# Patient Record
Sex: Male | Born: 1940 | Race: Asian | Hispanic: No | Marital: Married | Smoking: Former smoker
Health system: Southern US, Community
[De-identification: ages and names within clinical notes are randomized; demographics above are authoritative.]

## PROBLEM LIST (undated history)

## (undated) DIAGNOSIS — D649 Anemia, unspecified: Secondary | ICD-10-CM

## (undated) DIAGNOSIS — R011 Cardiac murmur, unspecified: Secondary | ICD-10-CM

## (undated) DIAGNOSIS — H4010X0 Unspecified open-angle glaucoma, stage unspecified: Secondary | ICD-10-CM

## (undated) DIAGNOSIS — R339 Retention of urine, unspecified: Secondary | ICD-10-CM

## (undated) DIAGNOSIS — J9611 Chronic respiratory failure with hypoxia: Secondary | ICD-10-CM

## (undated) DIAGNOSIS — N189 Chronic kidney disease, unspecified: Secondary | ICD-10-CM

## (undated) DIAGNOSIS — Q794 Prune belly syndrome: Secondary | ICD-10-CM

## (undated) DIAGNOSIS — I2510 Atherosclerotic heart disease of native coronary artery without angina pectoris: Secondary | ICD-10-CM

## (undated) DIAGNOSIS — Z87891 Personal history of nicotine dependence: Secondary | ICD-10-CM

## (undated) DIAGNOSIS — I259 Chronic ischemic heart disease, unspecified: Secondary | ICD-10-CM

## (undated) DIAGNOSIS — R918 Other nonspecific abnormal finding of lung field: Secondary | ICD-10-CM

## (undated) DIAGNOSIS — J449 Chronic obstructive pulmonary disease, unspecified: Secondary | ICD-10-CM

## (undated) DIAGNOSIS — I10 Essential (primary) hypertension: Secondary | ICD-10-CM

## (undated) DIAGNOSIS — K219 Gastro-esophageal reflux disease without esophagitis: Secondary | ICD-10-CM

## (undated) DIAGNOSIS — N320 Bladder-neck obstruction: Secondary | ICD-10-CM

## (undated) DIAGNOSIS — N390 Urinary tract infection, site not specified: Secondary | ICD-10-CM

## (undated) DIAGNOSIS — N200 Calculus of kidney: Secondary | ICD-10-CM

## (undated) DIAGNOSIS — E119 Type 2 diabetes mellitus without complications: Secondary | ICD-10-CM

## (undated) DIAGNOSIS — R0602 Shortness of breath: Secondary | ICD-10-CM

## (undated) DIAGNOSIS — G4730 Sleep apnea, unspecified: Secondary | ICD-10-CM

## (undated) DIAGNOSIS — H409 Unspecified glaucoma: Secondary | ICD-10-CM

## (undated) HISTORY — PX: HERNIA REPAIR: SHX51

## (undated) HISTORY — DX: Chronic kidney disease, unspecified: N18.9

## (undated) HISTORY — DX: Personal history of nicotine dependence: Z87.891

## (undated) HISTORY — DX: Chronic ischemic heart disease, unspecified: I25.9

## (undated) HISTORY — DX: Chronic respiratory failure with hypoxia: J96.11

## (undated) HISTORY — PX: CATARACT EXTRACTION, BILATERAL: SHX1313

## (undated) HISTORY — DX: Bladder-neck obstruction: N32.0

## (undated) HISTORY — DX: Unspecified open-angle glaucoma, stage unspecified: H40.10X0

## (undated) HISTORY — PX: BLADDER NECK RECONSTRUCTION: SHX1239

## (undated) HISTORY — DX: Essential (primary) hypertension: I10

## (undated) HISTORY — DX: Type 2 diabetes mellitus without complications: E11.9

## (undated) HISTORY — DX: Urinary tract infection, site not specified: N39.0

## (undated) HISTORY — DX: Chronic obstructive pulmonary disease, unspecified: J44.9

## (undated) HISTORY — DX: Retention of urine, unspecified: R33.9

## (undated) HISTORY — DX: Anemia, unspecified: D64.9

## (undated) HISTORY — DX: Calculus of kidney: N20.0

## (undated) HISTORY — DX: Gastro-esophageal reflux disease without esophagitis: K21.9

## (undated) HISTORY — DX: Prune belly syndrome: Q79.4

## (undated) HISTORY — DX: Atherosclerotic heart disease of native coronary artery without angina pectoris: I25.10

## (undated) HISTORY — DX: Shortness of breath: R06.02

---

## 2013-09-05 DIAGNOSIS — N390 Urinary tract infection, site not specified: Secondary | ICD-10-CM

## 2013-09-05 DIAGNOSIS — R339 Retention of urine, unspecified: Secondary | ICD-10-CM

## 2013-09-05 HISTORY — DX: Urinary tract infection, site not specified: N39.0

## 2013-09-05 HISTORY — DX: Retention of urine, unspecified: R33.9

## 2014-04-24 DIAGNOSIS — N320 Bladder-neck obstruction: Secondary | ICD-10-CM

## 2014-04-24 HISTORY — DX: Bladder-neck obstruction: N32.0

## 2014-05-26 DIAGNOSIS — I129 Hypertensive chronic kidney disease with stage 1 through stage 4 chronic kidney disease, or unspecified chronic kidney disease: Secondary | ICD-10-CM

## 2014-05-26 DIAGNOSIS — E1129 Type 2 diabetes mellitus with other diabetic kidney complication: Secondary | ICD-10-CM

## 2014-05-26 DIAGNOSIS — N183 Chronic kidney disease, stage 3 unspecified: Secondary | ICD-10-CM | POA: Insufficient documentation

## 2014-05-26 DIAGNOSIS — D631 Anemia in chronic kidney disease: Secondary | ICD-10-CM | POA: Insufficient documentation

## 2014-05-26 DIAGNOSIS — E1122 Type 2 diabetes mellitus with diabetic chronic kidney disease: Secondary | ICD-10-CM | POA: Insufficient documentation

## 2014-05-26 DIAGNOSIS — Q794 Prune belly syndrome: Secondary | ICD-10-CM

## 2014-05-26 DIAGNOSIS — J449 Chronic obstructive pulmonary disease, unspecified: Secondary | ICD-10-CM

## 2014-05-26 DIAGNOSIS — Z87891 Personal history of nicotine dependence: Secondary | ICD-10-CM

## 2014-05-26 DIAGNOSIS — D649 Anemia, unspecified: Secondary | ICD-10-CM

## 2014-05-26 DIAGNOSIS — K219 Gastro-esophageal reflux disease without esophagitis: Secondary | ICD-10-CM

## 2014-05-26 DIAGNOSIS — IMO0001 Reserved for inherently not codable concepts without codable children: Secondary | ICD-10-CM | POA: Insufficient documentation

## 2014-05-26 DIAGNOSIS — N189 Chronic kidney disease, unspecified: Secondary | ICD-10-CM

## 2014-05-26 DIAGNOSIS — E119 Type 2 diabetes mellitus without complications: Secondary | ICD-10-CM

## 2014-05-26 DIAGNOSIS — Z794 Long term (current) use of insulin: Secondary | ICD-10-CM

## 2014-05-26 DIAGNOSIS — I10 Essential (primary) hypertension: Secondary | ICD-10-CM

## 2014-05-26 HISTORY — DX: Anemia, unspecified: D64.9

## 2014-05-26 HISTORY — DX: Type 2 diabetes mellitus without complications: E11.9

## 2014-05-26 HISTORY — DX: Essential (primary) hypertension: I10

## 2014-05-26 HISTORY — DX: Chronic obstructive pulmonary disease, unspecified: J44.9

## 2014-05-26 HISTORY — DX: Gastro-esophageal reflux disease without esophagitis: K21.9

## 2014-05-26 HISTORY — DX: Prune belly syndrome: Q79.4

## 2014-05-26 HISTORY — DX: Chronic kidney disease, unspecified: N18.9

## 2014-05-26 HISTORY — DX: Personal history of nicotine dependence: Z87.891

## 2014-08-25 DIAGNOSIS — E119 Type 2 diabetes mellitus without complications: Secondary | ICD-10-CM | POA: Diagnosis not present

## 2014-08-25 DIAGNOSIS — R601 Generalized edema: Secondary | ICD-10-CM | POA: Diagnosis not present

## 2014-08-25 DIAGNOSIS — E785 Hyperlipidemia, unspecified: Secondary | ICD-10-CM | POA: Diagnosis not present

## 2014-08-25 DIAGNOSIS — J441 Chronic obstructive pulmonary disease with (acute) exacerbation: Secondary | ICD-10-CM | POA: Diagnosis not present

## 2014-08-25 DIAGNOSIS — I509 Heart failure, unspecified: Secondary | ICD-10-CM | POA: Diagnosis not present

## 2014-09-18 DIAGNOSIS — R339 Retention of urine, unspecified: Secondary | ICD-10-CM | POA: Diagnosis not present

## 2014-09-18 DIAGNOSIS — N390 Urinary tract infection, site not specified: Secondary | ICD-10-CM | POA: Diagnosis not present

## 2014-09-18 DIAGNOSIS — N289 Disorder of kidney and ureter, unspecified: Secondary | ICD-10-CM | POA: Diagnosis not present

## 2014-10-19 DIAGNOSIS — E559 Vitamin D deficiency, unspecified: Secondary | ICD-10-CM | POA: Diagnosis not present

## 2014-10-19 DIAGNOSIS — I10 Essential (primary) hypertension: Secondary | ICD-10-CM | POA: Diagnosis not present

## 2014-10-19 DIAGNOSIS — J449 Chronic obstructive pulmonary disease, unspecified: Secondary | ICD-10-CM | POA: Diagnosis not present

## 2014-10-19 DIAGNOSIS — M169 Osteoarthritis of hip, unspecified: Secondary | ICD-10-CM | POA: Diagnosis not present

## 2014-10-19 DIAGNOSIS — E119 Type 2 diabetes mellitus without complications: Secondary | ICD-10-CM | POA: Diagnosis not present

## 2014-10-26 DIAGNOSIS — H6063 Unspecified chronic otitis externa, bilateral: Secondary | ICD-10-CM | POA: Diagnosis not present

## 2014-10-26 DIAGNOSIS — J310 Chronic rhinitis: Secondary | ICD-10-CM | POA: Diagnosis not present

## 2014-10-26 DIAGNOSIS — H6123 Impacted cerumen, bilateral: Secondary | ICD-10-CM | POA: Diagnosis not present

## 2014-10-26 DIAGNOSIS — H9193 Unspecified hearing loss, bilateral: Secondary | ICD-10-CM | POA: Diagnosis not present

## 2014-11-08 DIAGNOSIS — J019 Acute sinusitis, unspecified: Secondary | ICD-10-CM | POA: Diagnosis not present

## 2014-11-08 DIAGNOSIS — J209 Acute bronchitis, unspecified: Secondary | ICD-10-CM | POA: Diagnosis not present

## 2014-11-08 DIAGNOSIS — B002 Herpesviral gingivostomatitis and pharyngotonsillitis: Secondary | ICD-10-CM | POA: Diagnosis not present

## 2014-11-16 DIAGNOSIS — E559 Vitamin D deficiency, unspecified: Secondary | ICD-10-CM | POA: Diagnosis not present

## 2014-11-16 DIAGNOSIS — E785 Hyperlipidemia, unspecified: Secondary | ICD-10-CM | POA: Diagnosis not present

## 2014-11-16 DIAGNOSIS — R601 Generalized edema: Secondary | ICD-10-CM | POA: Diagnosis not present

## 2014-11-16 DIAGNOSIS — E119 Type 2 diabetes mellitus without complications: Secondary | ICD-10-CM | POA: Diagnosis not present

## 2014-11-16 DIAGNOSIS — I10 Essential (primary) hypertension: Secondary | ICD-10-CM | POA: Diagnosis not present

## 2014-11-16 DIAGNOSIS — N189 Chronic kidney disease, unspecified: Secondary | ICD-10-CM | POA: Diagnosis not present

## 2014-11-21 DIAGNOSIS — J310 Chronic rhinitis: Secondary | ICD-10-CM | POA: Diagnosis not present

## 2014-11-21 DIAGNOSIS — H6123 Impacted cerumen, bilateral: Secondary | ICD-10-CM | POA: Diagnosis not present

## 2014-11-21 DIAGNOSIS — H6063 Unspecified chronic otitis externa, bilateral: Secondary | ICD-10-CM | POA: Diagnosis not present

## 2014-11-21 DIAGNOSIS — H9193 Unspecified hearing loss, bilateral: Secondary | ICD-10-CM | POA: Diagnosis not present

## 2014-12-19 DIAGNOSIS — I10 Essential (primary) hypertension: Secondary | ICD-10-CM | POA: Diagnosis not present

## 2014-12-19 DIAGNOSIS — E559 Vitamin D deficiency, unspecified: Secondary | ICD-10-CM | POA: Diagnosis not present

## 2014-12-19 DIAGNOSIS — M109 Gout, unspecified: Secondary | ICD-10-CM | POA: Diagnosis not present

## 2014-12-19 DIAGNOSIS — E785 Hyperlipidemia, unspecified: Secondary | ICD-10-CM | POA: Diagnosis not present

## 2014-12-19 DIAGNOSIS — M1990 Unspecified osteoarthritis, unspecified site: Secondary | ICD-10-CM | POA: Diagnosis not present

## 2014-12-19 DIAGNOSIS — E119 Type 2 diabetes mellitus without complications: Secondary | ICD-10-CM | POA: Diagnosis not present

## 2014-12-19 DIAGNOSIS — R7989 Other specified abnormal findings of blood chemistry: Secondary | ICD-10-CM | POA: Diagnosis not present

## 2014-12-19 DIAGNOSIS — J449 Chronic obstructive pulmonary disease, unspecified: Secondary | ICD-10-CM | POA: Diagnosis not present

## 2015-01-09 DIAGNOSIS — H4011X2 Primary open-angle glaucoma, moderate stage: Secondary | ICD-10-CM | POA: Diagnosis not present

## 2015-01-09 DIAGNOSIS — H18419 Arcus senilis, unspecified eye: Secondary | ICD-10-CM | POA: Diagnosis not present

## 2015-01-09 DIAGNOSIS — I7090 Unspecified atherosclerosis: Secondary | ICD-10-CM | POA: Diagnosis not present

## 2015-01-09 DIAGNOSIS — H02839 Dermatochalasis of unspecified eye, unspecified eyelid: Secondary | ICD-10-CM | POA: Diagnosis not present

## 2015-01-09 DIAGNOSIS — E119 Type 2 diabetes mellitus without complications: Secondary | ICD-10-CM | POA: Diagnosis not present

## 2015-01-09 DIAGNOSIS — H35033 Hypertensive retinopathy, bilateral: Secondary | ICD-10-CM | POA: Diagnosis not present

## 2015-01-22 DIAGNOSIS — J209 Acute bronchitis, unspecified: Secondary | ICD-10-CM | POA: Diagnosis not present

## 2015-01-22 DIAGNOSIS — J019 Acute sinusitis, unspecified: Secondary | ICD-10-CM | POA: Diagnosis not present

## 2015-02-27 DIAGNOSIS — M109 Gout, unspecified: Secondary | ICD-10-CM | POA: Diagnosis not present

## 2015-02-27 DIAGNOSIS — J449 Chronic obstructive pulmonary disease, unspecified: Secondary | ICD-10-CM | POA: Diagnosis not present

## 2015-02-27 DIAGNOSIS — E119 Type 2 diabetes mellitus without complications: Secondary | ICD-10-CM | POA: Diagnosis not present

## 2015-02-27 DIAGNOSIS — R7989 Other specified abnormal findings of blood chemistry: Secondary | ICD-10-CM | POA: Diagnosis not present

## 2015-02-27 DIAGNOSIS — I10 Essential (primary) hypertension: Secondary | ICD-10-CM | POA: Diagnosis not present

## 2015-02-27 DIAGNOSIS — E785 Hyperlipidemia, unspecified: Secondary | ICD-10-CM | POA: Diagnosis not present

## 2015-03-19 DIAGNOSIS — R339 Retention of urine, unspecified: Secondary | ICD-10-CM | POA: Diagnosis not present

## 2015-03-19 DIAGNOSIS — N200 Calculus of kidney: Secondary | ICD-10-CM | POA: Diagnosis not present

## 2015-03-19 DIAGNOSIS — N289 Disorder of kidney and ureter, unspecified: Secondary | ICD-10-CM | POA: Diagnosis not present

## 2015-03-19 DIAGNOSIS — N390 Urinary tract infection, site not specified: Secondary | ICD-10-CM | POA: Diagnosis not present

## 2015-03-19 HISTORY — DX: Calculus of kidney: N20.0

## 2015-03-21 DIAGNOSIS — N200 Calculus of kidney: Secondary | ICD-10-CM | POA: Diagnosis not present

## 2015-03-21 DIAGNOSIS — Z01818 Encounter for other preprocedural examination: Secondary | ICD-10-CM | POA: Diagnosis not present

## 2015-03-26 DIAGNOSIS — N200 Calculus of kidney: Secondary | ICD-10-CM | POA: Diagnosis not present

## 2015-03-30 DIAGNOSIS — E119 Type 2 diabetes mellitus without complications: Secondary | ICD-10-CM | POA: Diagnosis not present

## 2015-03-30 DIAGNOSIS — M7981 Nontraumatic hematoma of soft tissue: Secondary | ICD-10-CM | POA: Diagnosis not present

## 2015-03-30 DIAGNOSIS — J449 Chronic obstructive pulmonary disease, unspecified: Secondary | ICD-10-CM | POA: Diagnosis not present

## 2015-04-23 DIAGNOSIS — E669 Obesity, unspecified: Secondary | ICD-10-CM | POA: Diagnosis not present

## 2015-04-23 DIAGNOSIS — E662 Morbid (severe) obesity with alveolar hypoventilation: Secondary | ICD-10-CM | POA: Diagnosis not present

## 2015-04-23 DIAGNOSIS — J45909 Unspecified asthma, uncomplicated: Secondary | ICD-10-CM | POA: Diagnosis not present

## 2015-04-23 DIAGNOSIS — J9611 Chronic respiratory failure with hypoxia: Secondary | ICD-10-CM | POA: Diagnosis not present

## 2015-04-24 DIAGNOSIS — Z794 Long term (current) use of insulin: Secondary | ICD-10-CM | POA: Diagnosis not present

## 2015-04-24 DIAGNOSIS — E538 Deficiency of other specified B group vitamins: Secondary | ICD-10-CM | POA: Diagnosis not present

## 2015-04-24 DIAGNOSIS — E119 Type 2 diabetes mellitus without complications: Secondary | ICD-10-CM | POA: Diagnosis not present

## 2015-09-19 DIAGNOSIS — I129 Hypertensive chronic kidney disease with stage 1 through stage 4 chronic kidney disease, or unspecified chronic kidney disease: Secondary | ICD-10-CM | POA: Diagnosis not present

## 2015-09-19 DIAGNOSIS — E119 Type 2 diabetes mellitus without complications: Secondary | ICD-10-CM | POA: Diagnosis not present

## 2015-09-19 DIAGNOSIS — E1122 Type 2 diabetes mellitus with diabetic chronic kidney disease: Secondary | ICD-10-CM | POA: Diagnosis not present

## 2015-09-19 DIAGNOSIS — N200 Calculus of kidney: Secondary | ICD-10-CM | POA: Diagnosis not present

## 2015-09-19 DIAGNOSIS — N183 Chronic kidney disease, stage 3 (moderate): Secondary | ICD-10-CM | POA: Diagnosis not present

## 2015-09-19 DIAGNOSIS — N189 Chronic kidney disease, unspecified: Secondary | ICD-10-CM | POA: Diagnosis not present

## 2015-09-19 DIAGNOSIS — J449 Chronic obstructive pulmonary disease, unspecified: Secondary | ICD-10-CM | POA: Diagnosis not present

## 2015-10-17 DIAGNOSIS — H00015 Hordeolum externum left lower eyelid: Secondary | ICD-10-CM | POA: Diagnosis not present

## 2015-10-17 DIAGNOSIS — H35033 Hypertensive retinopathy, bilateral: Secondary | ICD-10-CM | POA: Diagnosis not present

## 2015-10-17 DIAGNOSIS — H401132 Primary open-angle glaucoma, bilateral, moderate stage: Secondary | ICD-10-CM | POA: Diagnosis not present

## 2015-10-17 DIAGNOSIS — H02839 Dermatochalasis of unspecified eye, unspecified eyelid: Secondary | ICD-10-CM | POA: Diagnosis not present

## 2015-10-17 DIAGNOSIS — H35019 Changes in retinal vascular appearance, unspecified eye: Secondary | ICD-10-CM | POA: Diagnosis not present

## 2015-10-17 DIAGNOSIS — H43399 Other vitreous opacities, unspecified eye: Secondary | ICD-10-CM | POA: Diagnosis not present

## 2015-10-17 DIAGNOSIS — H10029 Other mucopurulent conjunctivitis, unspecified eye: Secondary | ICD-10-CM | POA: Diagnosis not present

## 2015-10-17 DIAGNOSIS — H18419 Arcus senilis, unspecified eye: Secondary | ICD-10-CM | POA: Diagnosis not present

## 2015-10-17 DIAGNOSIS — E119 Type 2 diabetes mellitus without complications: Secondary | ICD-10-CM | POA: Diagnosis not present

## 2015-11-06 DIAGNOSIS — E119 Type 2 diabetes mellitus without complications: Secondary | ICD-10-CM | POA: Diagnosis not present

## 2015-11-06 DIAGNOSIS — Z961 Presence of intraocular lens: Secondary | ICD-10-CM | POA: Diagnosis not present

## 2015-11-06 DIAGNOSIS — H35372 Puckering of macula, left eye: Secondary | ICD-10-CM | POA: Diagnosis not present

## 2015-11-06 DIAGNOSIS — H401131 Primary open-angle glaucoma, bilateral, mild stage: Secondary | ICD-10-CM | POA: Diagnosis not present

## 2016-02-20 DIAGNOSIS — I509 Heart failure, unspecified: Secondary | ICD-10-CM | POA: Diagnosis not present

## 2016-02-20 DIAGNOSIS — J449 Chronic obstructive pulmonary disease, unspecified: Secondary | ICD-10-CM | POA: Diagnosis not present

## 2016-02-20 DIAGNOSIS — I10 Essential (primary) hypertension: Secondary | ICD-10-CM | POA: Diagnosis not present

## 2016-02-20 DIAGNOSIS — E1122 Type 2 diabetes mellitus with diabetic chronic kidney disease: Secondary | ICD-10-CM | POA: Diagnosis not present

## 2016-02-20 DIAGNOSIS — E119 Type 2 diabetes mellitus without complications: Secondary | ICD-10-CM | POA: Diagnosis not present

## 2016-03-07 DIAGNOSIS — I10 Essential (primary) hypertension: Secondary | ICD-10-CM | POA: Diagnosis not present

## 2016-03-07 DIAGNOSIS — I509 Heart failure, unspecified: Secondary | ICD-10-CM | POA: Diagnosis not present

## 2016-03-07 DIAGNOSIS — F419 Anxiety disorder, unspecified: Secondary | ICD-10-CM | POA: Diagnosis not present

## 2016-03-07 DIAGNOSIS — J449 Chronic obstructive pulmonary disease, unspecified: Secondary | ICD-10-CM | POA: Diagnosis not present

## 2016-03-24 DIAGNOSIS — N200 Calculus of kidney: Secondary | ICD-10-CM | POA: Diagnosis not present

## 2016-03-24 DIAGNOSIS — R351 Nocturia: Secondary | ICD-10-CM | POA: Diagnosis not present

## 2016-05-23 DIAGNOSIS — Z6836 Body mass index (BMI) 36.0-36.9, adult: Secondary | ICD-10-CM | POA: Diagnosis not present

## 2016-05-23 DIAGNOSIS — J45909 Unspecified asthma, uncomplicated: Secondary | ICD-10-CM | POA: Diagnosis not present

## 2016-05-23 DIAGNOSIS — E662 Morbid (severe) obesity with alveolar hypoventilation: Secondary | ICD-10-CM | POA: Diagnosis not present

## 2016-05-23 DIAGNOSIS — J9611 Chronic respiratory failure with hypoxia: Secondary | ICD-10-CM | POA: Diagnosis not present

## 2016-06-02 DIAGNOSIS — Z23 Encounter for immunization: Secondary | ICD-10-CM | POA: Diagnosis not present

## 2016-12-10 ENCOUNTER — Telehealth: Payer: Self-pay | Admitting: *Deleted

## 2016-12-10 NOTE — Telephone Encounter (Signed)
PreVisit Call attempted. Left VM. 

## 2016-12-11 ENCOUNTER — Encounter: Payer: Self-pay | Admitting: Family Medicine

## 2016-12-11 ENCOUNTER — Ambulatory Visit (INDEPENDENT_AMBULATORY_CARE_PROVIDER_SITE_OTHER): Payer: Medicare Other | Admitting: Family Medicine

## 2016-12-11 ENCOUNTER — Ambulatory Visit (INDEPENDENT_AMBULATORY_CARE_PROVIDER_SITE_OTHER): Payer: Medicare Other

## 2016-12-11 VITALS — BP 108/64 | HR 65 | Temp 98.2°F | Wt 202.8 lb

## 2016-12-11 DIAGNOSIS — E162 Hypoglycemia, unspecified: Secondary | ICD-10-CM | POA: Diagnosis not present

## 2016-12-11 DIAGNOSIS — D649 Anemia, unspecified: Secondary | ICD-10-CM | POA: Diagnosis not present

## 2016-12-11 DIAGNOSIS — E875 Hyperkalemia: Secondary | ICD-10-CM | POA: Diagnosis not present

## 2016-12-11 DIAGNOSIS — R079 Chest pain, unspecified: Secondary | ICD-10-CM | POA: Diagnosis not present

## 2016-12-11 DIAGNOSIS — N183 Chronic kidney disease, stage 3 unspecified: Secondary | ICD-10-CM

## 2016-12-11 DIAGNOSIS — R109 Unspecified abdominal pain: Secondary | ICD-10-CM

## 2016-12-11 DIAGNOSIS — J449 Chronic obstructive pulmonary disease, unspecified: Secondary | ICD-10-CM | POA: Diagnosis not present

## 2016-12-11 DIAGNOSIS — E559 Vitamin D deficiency, unspecified: Secondary | ICD-10-CM

## 2016-12-11 DIAGNOSIS — I952 Hypotension due to drugs: Secondary | ICD-10-CM | POA: Diagnosis not present

## 2016-12-11 DIAGNOSIS — Z794 Long term (current) use of insulin: Secondary | ICD-10-CM

## 2016-12-11 DIAGNOSIS — E1122 Type 2 diabetes mellitus with diabetic chronic kidney disease: Secondary | ICD-10-CM | POA: Diagnosis not present

## 2016-12-11 LAB — POCT URINALYSIS DIPSTICK
Bilirubin, UA: NEGATIVE
Blood, UA: NEGATIVE
Glucose, UA: NEGATIVE
Ketones, UA: NEGATIVE
Leukocytes, UA: NEGATIVE
Nitrite, UA: NEGATIVE
Protein, UA: NEGATIVE
Spec Grav, UA: 1.02 (ref 1.010–1.025)
Urobilinogen, UA: 0.2 E.U./dL
pH, UA: 6 (ref 5.0–8.0)

## 2016-12-11 LAB — CBC
HCT: 38.1 % — ABNORMAL LOW (ref 39.0–52.0)
Hemoglobin: 12.4 g/dL — ABNORMAL LOW (ref 13.0–17.0)
MCHC: 32.4 g/dL (ref 30.0–36.0)
MCV: 94 fl (ref 78.0–100.0)
Platelets: 180 10*3/uL (ref 150.0–400.0)
RBC: 4.05 Mil/uL — ABNORMAL LOW (ref 4.22–5.81)
RDW: 14.9 % (ref 11.5–15.5)
WBC: 12.4 10*3/uL — ABNORMAL HIGH (ref 4.0–10.5)

## 2016-12-11 LAB — COMPREHENSIVE METABOLIC PANEL
ALT: 19 U/L (ref 0–53)
AST: 18 U/L (ref 0–37)
Albumin: 3.8 g/dL (ref 3.5–5.2)
Alkaline Phosphatase: 81 U/L (ref 39–117)
BUN: 48 mg/dL — ABNORMAL HIGH (ref 6–23)
CO2: 27 mEq/L (ref 19–32)
Calcium: 9.5 mg/dL (ref 8.4–10.5)
Chloride: 103 mEq/L (ref 96–112)
Creatinine, Ser: 2.05 mg/dL — ABNORMAL HIGH (ref 0.40–1.50)
GFR: 33.88 mL/min — ABNORMAL LOW (ref >60.00)
Glucose, Bld: 48 mg/dL — CL (ref 70–99)
Potassium: 5.2 mEq/L — ABNORMAL HIGH (ref 3.5–5.1)
Sodium: 137 mEq/L (ref 135–145)
Total Bilirubin: 0.3 mg/dL (ref 0.2–1.2)
Total Protein: 7.7 g/dL (ref 6.0–8.3)

## 2016-12-11 LAB — VITAMIN D 25 HYDROXY (VIT D DEFICIENCY, FRACTURES): VITD: 52.54 ng/mL (ref 30.00–100.00)

## 2016-12-11 LAB — POCT GLYCOSYLATED HEMOGLOBIN (HGB A1C): Hemoglobin A1C: 5.8

## 2016-12-11 MED ORDER — HYDROCODONE-ACETAMINOPHEN 10-325 MG PO TABS: 1.0000 | ORAL_TABLET | Freq: Three times a day (TID) | ORAL | 0 refills | Status: DC | PRN

## 2016-12-11 NOTE — Progress Notes (Addendum)
Lucas Price is a 76 y.o. male is here to [facility].   History of Present Illness:   Shaune Pascal CMA acting as scribe for Dr. [name].   HPI  Patient comes in today to establish care. He is having some left side flank pain and abdominal pain that has been going on for four days. Patient has a history of kidney stones that he sees a urologist for. Kidney stone is always on the left side as now. Patient also states that he has had some chest pain 2 days ago. He says that it is a pulling sensation. He has chronic shortness of breath, but has gotten worse over that last two days.  SEE A/P FOR PROBLEM BASED CHARTING.  Health Maintenance Due  Topic Date Due  . HEMOGLOBIN A1C  1941-06-01  . FOOT EXAM  08/15/1952  . OPHTHALMOLOGY EXAM  08/15/1952  . TETANUS/TDAP  08/15/1961  . COLONOSCOPY  08/15/1992  . PNA vac Low Risk Adult (1 of 2 - PCV13) 08/16/2007   PMHx, SurgHx, SocialHx, Medications, and Allergies were reviewed in the Visit Navigator and updated as appropriate.   Past Medical History:  Diagnosis Date  . Anemia 05/26/2014  . Bladder neck obstruction 04/24/2014  . CKD (chronic kidney disease) 05/26/2014  . COPD (chronic obstructive pulmonary disease) ([place]) 05/26/2014  . Diabetes mellitus ([place]) 05/26/2014  . Former smoker 05/26/2014  . GERD (gastroesophageal reflux disease) 05/26/2014  . HTN (hypertension) 05/26/2014  . 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 Recurrent urinary tract infection 09/05/2013  . Renal calculus 03/19/2015  . Retention of urine 09/05/2013   No past surgical history on file. No family history on file. Social History  Substance Use Topics  . Smoking status: Never Smoker  . Smokeless tobacco: Never Used  . Alcohol use No   Current Medications and Allergies:   .  allopurinol (ZYLOPRIM) 100 MG tablet, Take 100 mg by mouth., Disp: , Rfl:  .  aspirin EC 81 MG tablet, Take 81 mg by mouth., Disp: , Rfl:  .  calcium carbonate (CALCIUM 600) 600 MG TABS  tablet, Take 600 mg by mouth., Disp: , Rfl:  .  Cholecalciferol (D 10000) 10000 units CAPS, Take by mouth., Disp: , Rfl:  .  clotrimazole-betamethasone (LOTRISONE) cream, Apply topically., Disp: , Rfl:  .  Coenzyme Q-10 200 MG CAPS, Take 200 mg by mouth., Disp: , Rfl:  .  glimepiride (AMARYL) 4 MG tablet, Take 4 mg by mouth., Disp: , Rfl:  .  insulin glargine (LANTUS) 100 UNIT/ML injection, Inject 50 Units into the skin at bedtime., Disp: , Rfl:  .  losartan (COZAAR) 50 MG tablet, Take 25-50 mg by mouth., Disp: , Rfl:  .  metoprolol (TOPROL-XL) 200 MG 24 hr tablet, Take 200 mg by mouth., Disp: , Rfl:  .  sitaGLIPtin (JANUVIA) 100 MG tablet, Take 100mg  daily, Disp: , Rfl:  .  Tiotropium Bromide Monohydrate (SPIRIVA RESPIMAT) 2.5 MCG/ACT AERS, Use as directed, Disp: , Rfl:  .  torsemide (DEMADEX) 100 MG tablet, Take 25-50 mg by mouth., Disp: , Rfl:  .  Travoprost, BAK Free, (TRAVATAN Z) 0.004 % SOLN ophthalmic solution, Use as directed, Disp: , Rfl:  .  triamcinolone cream (KENALOG) 0.1 %, Use as directed as needed, Disp: , Rfl:   No Known Allergies   Review of Systems:   Review of Systems  Constitutional: Negative for chills, fever and malaise/fatigue.  HENT: Negative for congestion, ear pain, sinus pain and sore throat.  Eyes: Negative for blurred vision and double vision.  Respiratory: Positive for shortness of breath. Negative for cough and wheezing.        Chronic.   Cardiovascular: Positive for chest pain. Negative for palpitations and leg swelling.       Chest pain 2 days. Had a pulling sensation.   Gastrointestinal: Positive for abdominal pain. Negative for constipation, diarrhea and vomiting.       Left side.   Genitourinary: Negative for dysuria and hematuria.  Musculoskeletal: Negative for back pain, joint pain and neck pain.  Neurological: Negative for dizziness and headaches.  Psychiatric/Behavioral: Negative for depression, hallucinations and memory loss.   Vitals:    Vitals:   12/11/16 1416  BP: 108/64  Pulse: 65  Temp: 98.2 F (36.8 C)  TempSrc: Oral  SpO2: 94%  Weight: 202 lb 12.8 oz (92 kg)     There is no height or weight on file to calculate BMI.  Physical Exam:   Physical Exam  Constitutional: He appears well-developed and well-nourished. No distress.  HENT:  Head: Normocephalic and atraumatic.  Right Ear: External ear normal.  Left Ear: External ear normal.  Eyes: EOM are normal. Pupils are equal, round, and reactive to light.  Neck: No thyromegaly present.  Cardiovascular: Normal rate and regular rhythm.   Pulmonary/Chest: Effort normal. No accessory muscle usage. No tachypnea. No respiratory distress. He has no wheezes.  Abdominal: Soft. Bowel sounds are normal. He exhibits no mass.  Neurological: He is alert.  Skin: Skin is warm.   Results for orders placed or performed in visit on 12/11/16  CBC  Result Value Ref Range   WBC 12.4 (H) 4.0 - 10.5 K/uL   RBC 4.05 (L) 4.22 - 5.81 Mil/uL   Platelets 180.0 Repeated and verified X2. 150.0 - 400.0 K/uL   Hemoglobin 12.4 (L) 13.0 - 17.0 g/dL   HCT 38.1 (L) 39.0 - 52.0 %   MCV 94.0 78.0 - 100.0 fl   MCHC 32.4 30.0 - 36.0 g/dL   RDW 14.9 11.5 - 15.5 %  Comprehensive metabolic panel  Result Value Ref Range   Sodium 137 135 - 145 mEq/L   Potassium 5.2 (H) 3.5 - 5.1 mEq/L   Chloride 103 96 - 112 mEq/L   CO2 27 19 - 32 mEq/L   Glucose, Bld 48 (LL) 70 - 99 mg/dL   BUN 48 (H) 6 - 23 mg/dL   Creatinine, Ser 2.05 (H) 0.40 - 1.50 mg/dL   Total Bilirubin 0.3 0.2 - 1.2 mg/dL   Alkaline Phosphatase 81 39 - 117 U/L   AST 18 0 - 37 U/L   ALT 19 0 - 53 U/L   Total Protein 7.7 6.0 - 8.3 g/dL   Albumin 3.8 3.5 - 5.2 g/dL   Calcium 9.5 8.4 - 10.5 mg/dL   GFR 33.88 (L) >60.00 mL/min  VITAMIN D 25 Hydroxy (Vit-D Deficiency, Fractures)  Result Value Ref Range   VITD 52.54 30.00 - 100.00 ng/mL  POCT urinalysis dipstick  Result Value Ref Range   Color, UA Yellow    Clarity, UA Clear     Glucose, UA Negative    Bilirubin, UA Negative    Ketones, UA Negative    Spec Grav, UA 1.020 1.010 - 1.025   Blood, UA Negative    pH, UA 6.0 5.0 - 8.0   Protein, UA Negative    Urobilinogen, UA 0.2 0.2 or 1.0 E.U./dL   Nitrite, UA Negative    Leukocytes, UA Negative  Negative  POCT glycosylated hemoglobin (Hb A1C)  Result Value Ref Range   Hemoglobin A1C 5.8    Assessment and Plan:   Nolberto was seen today for establish care.  Diagnoses and all orders for this visit:  Chest pain, unspecified type Comments: None now. History of chest pain a few days ago. EKG reassuring. Multiple cardiac risk factors. Will refer to cardiology. Orders: -     CBC -     Comprehensive metabolic panel -     EKG 36-IWOE -     Ambulatory referral to Cardiology -     Cancel: DG Chest 2 View; Future  Flank pain Comments: With history of kidney stone. Followed by Urology. KUB today with findings of constipation. This is not new per the patient. Bowel regimen reviewed.   ULTRASOUND OF RETROPERITONEUM/URINARY TRACT, 09/19/2015 3:53 PM  1. Left nephrolithiasis. 2. Left lower pole renal cyst. 3. Parenchymal thinning, right greater than left. No hydronephrosis.    Orders:       -     DG Abd 1 View: There is moderate stool throughout the colon. There is no bowel dilatation or air-fluid levels suggesting bowel obstruction. No free air. There is degenerative change in the lumbar spine. There is atelectatic change in the left base. -     POCT urinalysis dipstick -     Urine culture -     CBC -     Comprehensive metabolic panel -     HYDROcodone-acetaminophen (NORCO) 10-325 MG tablet; Take 1 tablet by mouth every 8 (eight) hours as needed.  Type 2 diabetes mellitus with stage 3 chronic kidney disease, with long-term current use of insulin (HCC) Comments: Current symptoms: No polyuria, polydipsia, blurry vision, foot burning, numbness, or pain. Taking medication compliantly. Not monitoring blood glucose due to  annoyance of using strips. He endorses episodes of hypoglycemia around noon each day.  On ACE inhibitor or angiotensin II receptor blocker?  Yes On Aspirin? Yes  Overtreated. We'll discontinue Amaryl. Reviewed the importance of monitoring blood sugars.  Lab Results  Component Value Date   HGBA1C 5.8 12/11/2016   Orders: -     POCT glycosylated hemoglobin (Hb A1C) -     Continuous Blood Gluc Sensor ([place]) MISC; Per instructions. -     Continuous Blood Gluc Receiver (FREESTYLE LIBRE READER) DEVI; 1 Device by Does not apply route once.  Vitamin D deficiency Comments: Resolved. Orders: -     VITAMIN D 25 Hydroxy (Vit-D Deficiency, Fractures)  CKD (chronic kidney disease) stage 3, GFR 30-59 ml/min Comments: Worsened from baseline per patient. Will refer to renal for further evaluation. Will add on labs. See below.  Lab Results  Component Value Date   CREATININE 2.05 (H) 12/11/2016   Orders: -     Ambulatory referral to Nephrology -     Magnesium -     Phosphorus  Hypoglycemia Comments: I recommended stopping Amaryl. I will provide a prescription for continuous glucose monitoring.  Hypotension due to drugs Comments: Will recommend decreasing hypertension medication treatments.  Blood pressure 108/64, pulse 65.  Hyperkalemia Comments: Mild. Without EKG changes. EKG NSR.  Anemia, unspecified type Comments: Likely secondary to chronic kidney disease.  Lab Results  Component Value Date   WBC 12.4 (H) 12/11/2016   HGB 12.4 (L) 12/11/2016   HCT 38.1 (L) 12/11/2016   MCV 94.0 12/11/2016   PLT 180.0 Repeated and verified X2. 12/11/2016   Chronic obstructive pulmonary disease, unspecified COPD type ([place])  Comments: Patient uses nasal cannula oxygen 1-2 L at home. He is unable to walk any distance without significant oxygen desaturations. Former smoker.   . Reviewed expectations re: course of current medical issues. . Discussed  self-management of symptoms. . Outlined signs and symptoms indicating need for more acute intervention. . Patient verbalized understanding and all questions were answered. . See orders for this visit as documented in the electronic medical record. . Patient received an After Visit Summary.  Records requested if needed. I spent 60 minutes with this patient, greater than 50% was face-to-face time counseling regarding the above diagnoses.  CMA served as Education administrator during this visit. History, Physical, and Plan performed by medical provider. Documentation and orders reviewed and attested to. Briscoe Deutscher, D.O.  Briscoe Deutscher, [place], [facility] 12/11/2016

## 2016-12-12 ENCOUNTER — Telehealth: Payer: Self-pay | Admitting: Family Medicine

## 2016-12-12 ENCOUNTER — Other Ambulatory Visit (INDEPENDENT_AMBULATORY_CARE_PROVIDER_SITE_OTHER): Payer: Medicare Other

## 2016-12-12 DIAGNOSIS — N183 Chronic kidney disease, stage 3 unspecified: Secondary | ICD-10-CM

## 2016-12-12 LAB — PHOSPHORUS: Phosphorus: 5.5 mg/dL — ABNORMAL HIGH (ref 2.3–4.6)

## 2016-12-12 LAB — MAGNESIUM: Magnesium: 2.5 mg/dL (ref 1.5–2.5)

## 2016-12-12 LAB — URINE CULTURE: Organism ID, Bacteria: NO GROWTH

## 2016-12-12 MED ORDER — FREESTYLE LIBRE SENSOR SYSTEM MISC: 2 refills | Status: DC

## 2016-12-12 MED ORDER — FREESTYLE LIBRE READER DEVI: 1.0000 | Freq: Once | 0 refills | Status: DC

## 2016-12-12 NOTE — Addendum Note (Signed)
Addended by: Frutoso Chase A on: 12/12/2016 11:51 AM   Modules accepted: Orders

## 2016-12-12 NOTE — Telephone Encounter (Signed)
error 

## 2016-12-29 ENCOUNTER — Ambulatory Visit (INDEPENDENT_AMBULATORY_CARE_PROVIDER_SITE_OTHER): Payer: Medicare Other | Admitting: Family Medicine

## 2016-12-29 ENCOUNTER — Encounter: Payer: Self-pay | Admitting: Family Medicine

## 2016-12-29 ENCOUNTER — Telehealth: Payer: Self-pay | Admitting: Family Medicine

## 2016-12-29 VITALS — BP 104/58 | HR 80 | Temp 98.2°F | Wt 205.4 lb

## 2016-12-29 DIAGNOSIS — B029 Zoster without complications: Secondary | ICD-10-CM

## 2016-12-29 DIAGNOSIS — I10 Essential (primary) hypertension: Secondary | ICD-10-CM | POA: Diagnosis not present

## 2016-12-29 DIAGNOSIS — IMO0001 Reserved for inherently not codable concepts without codable children: Secondary | ICD-10-CM

## 2016-12-29 DIAGNOSIS — E1129 Type 2 diabetes mellitus with other diabetic kidney complication: Secondary | ICD-10-CM | POA: Diagnosis not present

## 2016-12-29 DIAGNOSIS — Z794 Long term (current) use of insulin: Secondary | ICD-10-CM | POA: Diagnosis not present

## 2016-12-29 MED ORDER — VALACYCLOVIR HCL 1 G PO TABS: 1000.0000 mg | ORAL_TABLET | Freq: Two times a day (BID) | ORAL | 0 refills | Status: AC

## 2016-12-29 MED ORDER — ACYCLOVIR 5 % EX OINT: 1.0000 "application " | TOPICAL_OINTMENT | CUTANEOUS | 3 refills | Status: DC

## 2016-12-29 NOTE — Telephone Encounter (Signed)
Patient thinks he has shingles.  I put him on Dr. Alcario Drought schedule for a same day at 2:30pm.  Thank you,  -LL

## 2016-12-29 NOTE — Progress Notes (Signed)
Lucas Price is a 76 y.o. male here for an acute visit.  History of Present Illness:   Lucas Price CMA acting as scribe for Dr. [name].  HPI Patient is coming in today due to rash on right side of chest under his arm. First noticed about three weeks ago. Patient has not tried anything on the rash. Rash has gotten worse since first noticed rash.   We also reviewed the patient's most recent labs and my recommendations. See lab results and notes.   PMHx, SurgHx, SocialHx, Medications, and Allergies were reviewed in the Visit Navigator and updated as appropriate.  Current Medications:   .  allopurinol (ZYLOPRIM) 100 MG tablet, Take 100 mg by mouth., Disp: , Rfl:  .  aspirin EC 81 MG tablet, Take 81 mg by mouth., Disp: , Rfl:  .  calcium carbonate (CALCIUM 600) 600 MG TABS tablet, Take 600 mg by mouth., Disp: , Rfl:  .  Cholecalciferol (D 10000) 10000 units CAPS, Take by mouth., Disp: , Rfl:  .  clotrimazole-betamethasone (LOTRISONE) cream, Apply topically., Disp: , Rfl:  .  Coenzyme Q-10 200 MG CAPS, Take 200 mg by mouth., Disp: , Rfl:  .  Continuous Blood Gluc Sensor ([place]) MISC, Per instructions., Disp: 1 each, Rfl: 2 .  glimepiride (AMARYL) 4 MG tablet, Take 4 mg by mouth., Disp: , Rfl:  .  HYDROcodone-acetaminophen (NORCO) 10-325 MG tablet, Take 1 tablet by mouth every 8 (eight) hours as needed., Disp: 30 tablet, Rfl: 0 .  insulin glargine (LANTUS) 100 UNIT/ML injection, Inject 50 Units into the skin at bedtime., Disp: , Rfl:  .  losartan (COZAAR) 50 MG tablet, Take 25-50 mg by mouth., Disp: , Rfl:  .  metoprolol (TOPROL-XL) 200 MG 24 hr tablet, Take 200 mg by mouth., Disp: , Rfl:  .  sitaGLIPtin (JANUVIA) 100 MG tablet, Take 100mg  daily, Disp: , Rfl:  .  Tiotropium Bromide Monohydrate (SPIRIVA RESPIMAT) 2.5 MCG/ACT AERS, Use as directed, Disp: , Rfl:  .  torsemide (DEMADEX) 100 MG tablet, Take 25-50 mg by mouth., Disp: , Rfl:  .  Travoprost, BAK Free,  (TRAVATAN Z) 0.004 % SOLN ophthalmic solution, Use as directed, Disp: , Rfl:  .  triamcinolone cream (KENALOG) 0.1 %, Use as directed as needed, Disp: , Rfl:  .  Continuous Blood Gluc Receiver (FREESTYLE LIBRE READER) DEVI, 1 Device by Does not apply route once., Disp: 1 Device, Rfl: 0   No Known Allergies   Review of Systems:   Review of Systems  Constitutional: Positive for malaise/fatigue. Negative for chills and fever.       Chronic.  HENT: Negative for congestion, ear pain, sinus pain and sore throat.   Eyes: Negative for blurred vision and double vision.  Respiratory: Positive for cough and shortness of breath. Negative for wheezing.        Chronic.   Cardiovascular: Negative for palpitations and leg swelling.  Gastrointestinal: Positive for constipation. Negative for abdominal pain and diarrhea.       Chronic.   Genitourinary: Negative for dysuria.  Musculoskeletal: Negative for back pain, joint pain and neck pain.  Skin: Positive for itching and rash.  Neurological: Negative for dizziness and headaches.  Psychiatric/Behavioral: Negative for depression, hallucinations and memory loss.    Vitals:   Vitals:   12/29/16 1438  BP: (!) 104/58  Pulse: 80  Temp: 98.2 F (36.8 C)  TempSrc: Oral  SpO2: 90%  Weight: 205 lb 6.4 oz (93.2 kg)  There is no height or weight on file to calculate BMI.  Physical Exam:   Physical Exam  Constitutional: He is oriented to person, place, and time. He appears well-developed and well-nourished. No distress.  HENT:  Head: Normocephalic and atraumatic.  Right Ear: External ear normal.  Left Ear: External ear normal.  Nose: Nose normal.  Mouth/Throat: Oropharynx is clear and moist.  Eyes: Conjunctivae and EOM are normal. Pupils are equal, round, and reactive to light.  Neck: Normal range of motion. Neck supple.  Cardiovascular: Normal rate, regular rhythm, normal heart sounds and intact distal pulses.   Pulmonary/Chest: Effort normal  and breath sounds normal.  Abdominal: Soft. Bowel sounds are normal.  Musculoskeletal: Normal range of motion.  Neurological: He is alert and oriented to person, place, and time.  Skin: Skin is warm and dry.     Psychiatric: He has a normal mood and affect. His behavior is normal. Judgment and thought content normal.  Nursing note and vitals reviewed.   Results for orders placed or performed in visit on 12/12/16  Magnesium  Result Value Ref Range   Magnesium 2.5 1.5 - 2.5 mg/dL  Phosphorus  Result Value Ref Range   Phosphorus 5.5 (H) 2.3 - 4.6 mg/dL    Assessment and Plan:   Monterio was seen today for rash.  Diagnoses and all orders for this visit:  Herpes zoster without complication Comments: With evidence of post-herpetic neuralgia.  Orders: -     acyclovir ointment (ZOVIRAX) 5 %; Apply 1 application topically every 3 (three) hours.  Essential hypertension Comments: He is not interested in changing his HTN regimen. I reviewed my reasoning for decreasing his current dose (increase perfusion, decrease falls).   Controlled insulin dependent diabetes mellitus with renal manifestation ([place]) Comments: He has decreaesd the Lantus since our last visit, but did not stop the Amaryl.    . Reviewed expectations re: course of current medical issues. . Discussed self-management of symptoms. . Outlined signs and symptoms indicating need for more acute intervention. . Patient verbalized understanding and all questions were answered. [name] Health Maintenance issues including appropriate healthy diet, exercise, and smoking avoidance were discussed with patient. . See orders for this visit as documented in the electronic medical record. . Patient received an After Visit Summary.  CMA served as Education administrator during this visit. History, Physical, and Plan performed by medical provider. The above documentation has been reviewed and is accurate and complete. Briscoe Deutscher, D.O.  Briscoe Deutscher,  DO [place], [facility] [place] 12/29/2016  No future appointments.

## 2016-12-29 NOTE — Patient Instructions (Addendum)
Results for orders placed or performed in visit on 12/12/16  Magnesium  Result Value Ref Range   Magnesium 2.5 1.5 - 2.5 mg/dL  Phosphorus  Result Value Ref Range   Phosphorus 5.5 (H) 2.3 - 4.6 mg/dL   Lab Results  Component Value Date   CREATININE 2.05 (H) 12/11/2016   Wt Readings from Last 3 Encounters:  12/29/16 205 lb 6.4 oz (93.2 kg)  12/11/16 202 lb 12.8 oz (92 kg)

## 2016-12-29 NOTE — Telephone Encounter (Signed)
Patient seen.

## 2016-12-29 NOTE — Telephone Encounter (Signed)
Dr. [name] -  Please provide questions if further evaluation prior to appointment is requested

## 2016-12-31 ENCOUNTER — Telehealth: Payer: Self-pay | Admitting: Family Medicine

## 2016-12-31 NOTE — Telephone Encounter (Signed)
Patient would like a prescription for acyloclovir called in to [facility] mail order pharmacy. 400 mg  Qty #270 1 tablet  3 times a day with 1 refill.

## 2016-12-31 NOTE — Telephone Encounter (Signed)
Please advise 

## 2017-01-01 MED ORDER — ACYCLOVIR 800 MG PO TABS: 800.0000 mg | ORAL_TABLET | Freq: Three times a day (TID) | ORAL | 0 refills | Status: DC

## 2017-01-01 NOTE — Telephone Encounter (Signed)
Spoke with patient and he was fine with the RX. He requested it be sent to [facility] mail order pharmacy. RX sent.

## 2017-01-01 NOTE — Telephone Encounter (Signed)
He has uncomplicated herpes zoster that is already resolving. Normally, treatment would be Acyclovir 800 five times daily x 7 days. This would be decreased to three times daily x 7 days with his GFR. I am okay with sending this Rx in only.

## 2017-01-07 ENCOUNTER — Telehealth: Payer: Self-pay | Admitting: Family Medicine

## 2017-01-07 DIAGNOSIS — R109 Unspecified abdominal pain: Secondary | ICD-10-CM

## 2017-01-07 NOTE — Telephone Encounter (Signed)
**  Remind patient they can make refill requests via MyChart**  Medication refill request (Name & Dosage):  HYDROcodone-acetaminophen (NORCO) 10-325 MG tablet  Preferred pharmacy (Name & Address):  [place], [place]. [phone] (Phone) [phone] (Fax)      Other comments (if applicable):    Please call and advise patient. Patient would like to get 40 tablets instead, patient is okay if he has to stay with the same dosage.

## 2017-01-07 NOTE — Telephone Encounter (Signed)
Please advise on refill.

## 2017-01-08 ENCOUNTER — Telehealth: Payer: Self-pay | Admitting: Family Medicine

## 2017-01-08 MED ORDER — HYDROCODONE-ACETAMINOPHEN 10-325 MG PO TABS: 1.0000 | ORAL_TABLET | Freq: Three times a day (TID) | ORAL | 0 refills | Status: DC | PRN

## 2017-01-08 NOTE — Telephone Encounter (Signed)
Patient called to check the status of the rx. Patient requests a call once it is prescribed. Patient requests a call today if possible.

## 2017-01-08 NOTE — Telephone Encounter (Signed)
Notified patient that RX will be up front for them to pick up. Patient is sending his son to pick up RX.

## 2017-01-08 NOTE — Telephone Encounter (Signed)
Okay refill. 

## 2017-01-08 NOTE — Addendum Note (Signed)
Addended by: Durwin Glaze on: 01/08/2017 04:07 PM   Modules accepted: Orders

## 2017-01-08 NOTE — Telephone Encounter (Signed)
error 

## 2017-01-22 ENCOUNTER — Telehealth: Payer: Self-pay | Admitting: Family Medicine

## 2017-01-22 MED ORDER — GLIMEPIRIDE 4 MG PO TABS: 4.0000 mg | ORAL_TABLET | Freq: Every day | ORAL | 1 refills | Status: DC

## 2017-01-22 NOTE — Telephone Encounter (Signed)
Patient is requesting the following sent to [facility] mail order  glimepiride (AMARYL) 4 MG tablet [[id number]]  Qty 180 1 refill Take one bid with meal

## 2017-01-22 NOTE — Telephone Encounter (Signed)
RX refill sent to [facility].

## 2017-01-27 ENCOUNTER — Encounter: Payer: Self-pay | Admitting: Family Medicine

## 2017-01-27 DIAGNOSIS — N2581 Secondary hyperparathyroidism of renal origin: Secondary | ICD-10-CM | POA: Diagnosis not present

## 2017-01-27 DIAGNOSIS — I129 Hypertensive chronic kidney disease with stage 1 through stage 4 chronic kidney disease, or unspecified chronic kidney disease: Secondary | ICD-10-CM | POA: Diagnosis not present

## 2017-01-27 DIAGNOSIS — D631 Anemia in chronic kidney disease: Secondary | ICD-10-CM | POA: Diagnosis not present

## 2017-01-27 DIAGNOSIS — N183 Chronic kidney disease, stage 3 (moderate): Secondary | ICD-10-CM | POA: Diagnosis not present

## 2017-01-27 LAB — CBC AND DIFFERENTIAL
HCT: 37 — AB (ref 41–53)
HEMATOCRIT: 37 — AB (ref 41–53)
HEMOGLOBIN: 12.6 — AB (ref 13.5–17.5)
Hemoglobin: 12.6 — AB (ref 13.5–17.5)
NEUTROS ABS: 8
PLATELETS: 198 (ref 150–399)
Platelets: 198 (ref 150–399)
WBC: 10.8
WBC: 10.8

## 2017-01-27 LAB — BASIC METABOLIC PANEL
BUN: 39 — AB (ref 4–21)
CREATININE: 2 — AB (ref 0.6–1.3)
GLUCOSE: 78
Potassium: 4.9 (ref 3.4–5.3)
Sodium: 140 (ref 137–147)

## 2017-01-27 LAB — VITAMIN D 25 HYDROXY (VIT D DEFICIENCY, FRACTURES)
VIT D 25 HYDROXY: 35.7
Vit D, 25-Hydroxy: 35.7

## 2017-01-30 ENCOUNTER — Encounter: Payer: Self-pay | Admitting: Family Medicine

## 2017-01-30 LAB — MAGNESIUM: Magnesium: 2.3

## 2017-01-30 LAB — FERRITIN: FERRITIN: 98

## 2017-01-30 LAB — ESTIMATED GFR: GFR CALC NON AF AMER: 32

## 2017-01-30 LAB — PROTEIN, URINE, RANDOM: PROTEIN UR: NEGATIVE

## 2017-02-19 ENCOUNTER — Telehealth: Payer: Self-pay | Admitting: Family Medicine

## 2017-02-19 ENCOUNTER — Other Ambulatory Visit: Payer: Self-pay

## 2017-02-19 DIAGNOSIS — E119 Type 2 diabetes mellitus without complications: Secondary | ICD-10-CM

## 2017-02-19 MED ORDER — INSULIN PEN NEEDLE 30G X 8 MM MISC: 1.0000 | 3 refills | Status: DC | PRN

## 2017-02-19 MED ORDER — SYRINGE (DISPOSABLE) 1 ML MISC: 3 refills | Status: AC

## 2017-02-19 MED ORDER — "INSULIN SYRINGE-NEEDLE U-100 31G X 5/16"" 1 ML MISC": 1.0000 | Freq: Three times a day (TID) | 1 refills | Status: DC

## 2017-02-19 NOTE — Telephone Encounter (Signed)
Rx sent to pharmacy   

## 2017-02-19 NOTE — Telephone Encounter (Signed)
Patient is requesting a new RX to [facility] on file. He states that he just ran out without realizing  VD insulin syringes 1 ML/ 8MM/ 30G 90 day supply with refills   Verified cell # is best

## 2017-03-30 DIAGNOSIS — N200 Calculus of kidney: Secondary | ICD-10-CM | POA: Diagnosis not present

## 2017-03-30 DIAGNOSIS — R351 Nocturia: Secondary | ICD-10-CM | POA: Diagnosis not present

## 2017-04-17 NOTE — Progress Notes (Deleted)
Subjective:   Lucas Price is a 75 y.o. male who presents for an Initial Medicare Annual Wellness Visit.  Review of Systems  No ROS.  Medicare Wellness Visit. Additional risk factors are reflected in the social history.       Objective:    There were no vitals filed for this visit. There is no height or weight on file to calculate BMI.  Current Medications (verified) Outpatient Encounter Prescriptions as of 04/23/2017  Medication Sig  . acyclovir (ZOVIRAX) 800 MG tablet Take 1 tablet (800 mg total) by mouth 3 (three) times daily.  [name] acyclovir ointment (ZOVIRAX) 5 % Apply 1 application topically every 3 (three) hours.  [name] allopurinol (ZYLOPRIM) 100 MG tablet Take 100 mg by mouth.  [name] aspirin EC 81 MG tablet Take 81 mg by mouth.  . calcium carbonate (CALCIUM 600) 600 MG TABS tablet Take 600 mg by mouth.  . Cholecalciferol (D 10000) 10000 units CAPS Take by mouth.  . clotrimazole-betamethasone (LOTRISONE) cream Apply topically.  . Coenzyme Q-10 200 MG CAPS Take 200 mg by mouth.  . Continuous Blood Gluc Receiver (FREESTYLE LIBRE READER) DEVI 1 Device by Does not apply route once.  . Continuous Blood Gluc Sensor ([place]) MISC Per instructions.  [name] glimepiride (AMARYL) 4 MG tablet Take 1 tablet (4 mg total) by mouth daily with breakfast.  . HYDROcodone-acetaminophen (NORCO) 10-325 MG tablet Take 1 tablet by mouth every 8 (eight) hours as needed.  . insulin glargine (LANTUS) 100 UNIT/ML injection Inject 50 Units into the skin at bedtime.  . Insulin Pen Needle (NOVOFINE) 30G X 8 MM MISC Inject 10 each into the skin as needed.  . Insulin Syringe-Needle U-100 (RELION INSULIN SYRINGE 1ML/31G) 31G X 5/16" 1 ML MISC 1 Syringe by Does not apply route 3 (three) times daily.  [name] losartan (COZAAR) 50 MG tablet Take 25-50 mg by mouth.  . metoprolol (TOPROL-XL) 200 MG 24 hr tablet Take 200 mg by mouth.  . sitaGLIPtin (JANUVIA) 100 MG tablet Take 100mg  daily  . Syringe,  Disposable, 1 ML MISC Use with insulin needle.  . Tiotropium Bromide Monohydrate (SPIRIVA RESPIMAT) 2.5 MCG/ACT AERS Use as directed  . torsemide (DEMADEX) 100 MG tablet Take 25-50 mg by mouth.  . Travoprost, BAK Free, (TRAVATAN Z) 0.004 % SOLN ophthalmic solution Use as directed  . triamcinolone cream (KENALOG) 0.1 % Use as directed as needed   No facility-administered encounter medications on file as of 04/23/2017.     Allergies (verified) Patient has no known allergies.   History: Past Medical History:  Diagnosis Date  . Anemia 05/26/2014  . Bladder neck obstruction 04/24/2014  . CKD (chronic kidney disease) 05/26/2014  . COPD (chronic obstructive pulmonary disease) ([place]) 05/26/2014  . Diabetes mellitus ([place]) 05/26/2014  . Former smoker 05/26/2014  . GERD (gastroesophageal reflux disease) 05/26/2014  . HTN (hypertension) 05/26/2014  . 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 Recurrent urinary tract infection 09/05/2013  . Renal calculus 03/19/2015  . Retention of urine 09/05/2013   No past surgical history on file. No family history on file. Social History   Occupational History  . Not on file.   Social History Main Topics  . Smoking status: Never Smoker  . Smokeless tobacco: Never Used  . Alcohol use No  . Drug use: No  . Sexual activity: Yes    Partners: Female   Tobacco Counseling Counseling given: Not Answered   Activities of Daily Living No flowsheet data found.  Immunizations  and Health Maintenance  There is no immunization history on file for this patient. Health Maintenance Due  Topic Date Due  . FOOT EXAM  08/15/1950  . OPHTHALMOLOGY EXAM  08/15/1952  . TETANUS/TDAP  08/15/1961  . COLONOSCOPY  08/15/1992  . PNA vac Low Risk Adult (1 of 2 - PCV13) 08/16/2007  . INFLUENZA VACCINE  03/11/2017    Patient Care Team: Briscoe Deutscher, DO as PCP - General (Family Medicine)  Indicate any recent Medical Services you may have received from other than [facility]  providers in the past year (date may be approximate).    Assessment:   This is a routine wellness examination for Lucas Price. Physical assessment deferred to PCP.   Hearing/Vision screen No exam data present  Dietary issues and exercise activities discussed:    Goals    None     Depression Screen No flowsheet data found.  Fall Risk No flowsheet data found.  Cognitive Function:        Screening Tests Health Maintenance  Topic Date Due  . FOOT EXAM  08/15/1952  . OPHTHALMOLOGY EXAM  08/15/1952  . TETANUS/TDAP  08/15/1961  . COLONOSCOPY  08/15/1992  . PNA vac Low Risk Adult (1 of 2 - PCV13) 08/16/2007  . INFLUENZA VACCINE  03/11/2017  . HEMOGLOBIN A1C  06/13/2017        Plan:   Follow up with PCP as directed.  I have personally reviewed and noted the following in the patient's chart:   . Medical and social history . Use of alcohol, tobacco or illicit drugs  . Current medications and supplements . Functional ability and status . Nutritional status . Physical activity . Advanced directives . List of other physicians . Vitals . Screenings to include cognitive, depression, and falls . Referrals and appointments  In addition, I have reviewed and discussed with patient certain preventive protocols, quality metrics, and best practice recommendations. A written personalized care plan for preventive services as well as general preventive health recommendations were provided to patient.     Ree Edman, RN   04/17/2017

## 2017-04-17 NOTE — Progress Notes (Deleted)
PCP notes:   Health maintenance: Foot exam Opthamology Exam Tdap Colonoscopy PCV 13   Abnormal screenings:    Patient concerns:    Nurse concerns:   Next PCP appt:

## 2017-04-17 NOTE — Progress Notes (Deleted)
Pre visit review using our clinic review tool, if applicable. No additional management support is needed unless otherwise documented below in the visit note. 

## 2017-04-23 ENCOUNTER — Ambulatory Visit: Payer: Medicare Other | Admitting: *Deleted

## 2017-04-23 ENCOUNTER — Other Ambulatory Visit: Payer: Medicare Other

## 2017-04-28 NOTE — Progress Notes (Signed)
Pre visit review using our clinic review tool, if applicable. No additional management support is needed unless otherwise documented below in the visit note. 

## 2017-04-28 NOTE — Progress Notes (Signed)
Subjective:   Lucas Price is a 76 y.o. male who presents for an Initial Medicare Annual Wellness Visit.  Review of Systems  No ROS.  Medicare Wellness Visit. Additional risk factors are reflected in the social history.  Cardiac Risk Factors include: advanced age (>64men, >74 women);diabetes mellitus;hypertension;obesity (BMI >30kg/m2);male gender    Objective:    Today's Vitals   04/29/17 1058  BP: (!) 110/58  Pulse: 79  Resp: 16  SpO2: 93%  Weight: 189 lb 12.8 oz (86.1 kg)  Height: 5\' 2"  (1.575 m)   Body mass index is 34.71 kg/m.  Current Medications (verified) Outpatient Encounter Prescriptions as of 04/29/2017  Medication Sig  . allopurinol (ZYLOPRIM) 100 MG tablet Take 100 mg by mouth daily.   [name] aspirin EC 81 MG tablet Take 81 mg by mouth daily.   . calcium carbonate (CALCIUM 600) 600 MG TABS tablet Take 600 mg by mouth daily.   . Cholecalciferol (D 10000) 10000 units CAPS Take by mouth 2 (two) times a week.   . Coenzyme Q-10 200 MG CAPS Take 200 mg by mouth daily.   . Continuous Blood Gluc Sensor ([place]) MISC Per instructions.  [name] glimepiride (AMARYL) 4 MG tablet Take 1 tablet (4 mg total) by mouth daily with breakfast.  . HYDROcodone-acetaminophen (NORCO) 10-325 MG tablet Take 1 tablet by mouth every 8 (eight) hours as needed.  . insulin glargine (LANTUS) 100 UNIT/ML injection Inject 50 Units into the skin at bedtime.  . Insulin Pen Needle (NOVOFINE) 30G X 8 MM MISC Inject 10 each into the skin as needed.  . Insulin Syringe-Needle U-100 (RELION INSULIN SYRINGE 1ML/31G) 31G X 5/16" 1 ML MISC 1 Syringe by Does not apply route 3 (three) times daily.  [name] losartan (COZAAR) 50 MG tablet Take 25-50 mg by mouth daily.   . metoprolol (TOPROL-XL) 200 MG 24 hr tablet Take 200 mg by mouth daily.   . sitaGLIPtin (JANUVIA) 100 MG tablet Take 100mg  daily  . Syringe, Disposable, 1 ML MISC Use with insulin needle.  . Tiotropium Bromide Monohydrate (SPIRIVA  RESPIMAT) 2.5 MCG/ACT AERS Use as directed  . torsemide (DEMADEX) 100 MG tablet Take 25-50 mg by mouth daily.   . Travoprost, BAK Free, (TRAVATAN Z) 0.004 % SOLN ophthalmic solution Use as directed  . triamcinolone cream (KENALOG) 0.1 % Use as directed as needed  . acyclovir (ZOVIRAX) 800 MG tablet Take 1 tablet (800 mg total) by mouth 3 (three) times daily. (Patient not taking: Reported on 04/29/2017)  . acyclovir ointment (ZOVIRAX) 5 % Apply 1 application topically every 3 (three) hours. (Patient not taking: Reported on 04/29/2017)  . clotrimazole-betamethasone (LOTRISONE) cream Apply topically.  . Continuous Blood Gluc Receiver (FREESTYLE LIBRE READER) DEVI 1 Device by Does not apply route once.   No facility-administered encounter medications on file as of 04/29/2017.     Allergies (verified) Patient has no known allergies.   History: Past Medical History:  Diagnosis Date  . Anemia 05/26/2014  . Bladder neck obstruction 04/24/2014  . CKD (chronic kidney disease) 05/26/2014  . COPD (chronic obstructive pulmonary disease) ([place]) 05/26/2014  . Diabetes mellitus ([place]) 05/26/2014  . Former smoker 05/26/2014  . GERD (gastroesophageal reflux disease) 05/26/2014  . HTN (hypertension) 05/26/2014  . 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 Recurrent urinary tract infection 09/05/2013  . Renal calculus 03/19/2015  . Retention of urine 09/05/2013   History reviewed. No pertinent surgical history. History reviewed. No pertinent family history. Social History  Occupational History  . Not on file.   Social History Main Topics  . Smoking status: Never Smoker  . Smokeless tobacco: Never Used  . Alcohol use No  . Drug use: No  . Sexual activity: Yes    Partners: Female   Tobacco Counseling Counseling given: Not Answered   Activities of Daily Living In your present state of health, do you have any difficulty performing the following activities: 04/29/2017  Hearing? N  Vision? N  Difficulty  concentrating or making decisions? N  Walking or climbing stairs? N  Dressing or bathing? N  Doing errands, shopping? N  Preparing Food and eating ? N  Using the Toilet? N  In the past six months, have you accidently leaked urine? N  Do you have problems with loss of bowel control? N  Managing your Medications? N  Managing your Finances? N  Housekeeping or managing your Housekeeping? N    Immunizations and Health Maintenance There is no immunization history for the selected administration types on file for this patient. Health Maintenance Due  Topic Date Due  . FOOT EXAM  08/15/1950  . OPHTHALMOLOGY EXAM  08/15/1952  . TETANUS/TDAP  08/15/1961  . COLONOSCOPY  08/15/1992  . PNA vac Low Risk Adult (1 of 2 - PCV13) 08/16/2007  . INFLUENZA VACCINE  03/11/2017    Patient Care Team: Briscoe Deutscher, DO as PCP - General (Family Medicine) Zadie Rhine Clent Demark, MD as Consulting Physician (Ophthalmology) Elmarie Shiley, MD as Consulting Physician (Nephrology) Burman Foster, MD as Physician Assistant (Urology)  Indicate any recent Medical Services you may have received from other than [facility] providers in the past year (date may be approximate).    Assessment:   This is a routine wellness examination for Lucas Price. Physical assessment deferred to PCP.  Diabetic Foot Exam - Simple   Simple Foot Form Diabetic Foot exam was performed with the following findings:  Yes 04/29/2017 11:53 AM  Visual Inspection No deformities, no ulcerations, no other skin breakdown bilaterally:  Yes Sensation Testing See comments:  Yes Pulse Check Comments Loss of sensation to left foot second toe and right foot heel.     Hearing/Vision screen  Hearing Screening   125Hz  250Hz  500Hz  1000Hz  2000Hz  3000Hz  4000Hz  6000Hz  8000Hz   Right ear:   0 0 40  0    Left ear:   0 40 40  40    Vision Screening Comments: Pt states that is has been over a year. Deloria Lair.   Dietary issues and exercise activities discussed: Current  Exercise Habits: Home exercise routine, Type of exercise: Other - see comments (Goes up and down stairs at home), Frequency (Times/Week): 7, Intensity: Mild, Exercise limited by: None identified  Goals    . Get better sleep.      Depression Screen PHQ 2/9 Scores 04/29/2017  PHQ - 2 Score 0    Fall Risk Fall Risk  04/29/2017  Falls in the past year? Yes  Number falls in past yr: 1  Injury with Fall? No  Follow up Education provided;Falls prevention discussed    Cognitive Function: Ad8 score reviewed for issues:  Issues making decisions:no  Less interest in hobbies / activities:no  Repeats questions, stories (family complaining):no  Trouble using ordinary gadgets (microwave, computer, phone):no  Forgets the month or year: no  Mismanaging finances: no  Remembering appts:no  Daily problems with thinking and/or memory:no Ad8 score is=0        Screening Tests Health Maintenance  Topic Date Due  .  FOOT EXAM  08[birth year]15/1950  . OPHTHALMOLOGY EXAM  08/15/1952  . TETANUS/TDAP  08/15/1961  . COLONOSCOPY  08/15/1992  . PNA vac Low Risk Adult (1 of 2 - PCV13) 08/16/2007  . INFLUENZA VACCINE  03/11/2017  . HEMOGLOBIN A1C  06/13/2017        Plan:   Follow up with PCP as directed.  I have personally reviewed and noted the following in the patient's chart:   . Medical and social history . Use of alcohol, tobacco or illicit drugs  . Current medications and supplements . Functional ability and status . Nutritional status . Physical activity . Advanced directives . List of other physicians . Vitals . Screenings to include cognitive, depression, and falls . Referrals and appointments  In addition, I have reviewed and discussed with patient certain preventive protocols, quality metrics, and best practice recommendations. A written personalized care plan for preventive services as well as general preventive health recommendations were provided to patient.     Ree Edman, RN   04/29/2017

## 2017-04-28 NOTE — Progress Notes (Signed)
PCP notes:   Health maintenance: Foot exam: Completed today. Opthalmology exam: Lucas Price, over 1 year ago. States he will have his results faxed after the next visit. Tdap: Will check with insurance company  Colonoscopy: Declines PCV13: Pt will get at CPE. Flu: Received today.  Abnormal screenings: Foot exam.   Patient concerns: Not sleeping well, wakes up several times during the night in-spite of wear CPAP. Sleep study was 8 years ago.   Nurse concerns: None.   Next PCP appt: 06/01/17 3:15

## 2017-04-29 ENCOUNTER — Encounter: Payer: Self-pay | Admitting: *Deleted

## 2017-04-29 ENCOUNTER — Other Ambulatory Visit: Payer: Self-pay | Admitting: Surgical

## 2017-04-29 ENCOUNTER — Other Ambulatory Visit (INDEPENDENT_AMBULATORY_CARE_PROVIDER_SITE_OTHER): Payer: Medicare Other

## 2017-04-29 ENCOUNTER — Ambulatory Visit (INDEPENDENT_AMBULATORY_CARE_PROVIDER_SITE_OTHER): Payer: Medicare Other | Admitting: *Deleted

## 2017-04-29 VITALS — BP 110/58 | HR 79 | Resp 16 | Ht 62.0 in | Wt 189.8 lb

## 2017-04-29 DIAGNOSIS — N189 Chronic kidney disease, unspecified: Secondary | ICD-10-CM

## 2017-04-29 DIAGNOSIS — E559 Vitamin D deficiency, unspecified: Secondary | ICD-10-CM | POA: Diagnosis not present

## 2017-04-29 DIAGNOSIS — R7989 Other specified abnormal findings of blood chemistry: Secondary | ICD-10-CM

## 2017-04-29 DIAGNOSIS — Z23 Encounter for immunization: Secondary | ICD-10-CM

## 2017-04-29 DIAGNOSIS — R739 Hyperglycemia, unspecified: Secondary | ICD-10-CM

## 2017-04-29 DIAGNOSIS — Z0000 Encounter for general adult medical examination without abnormal findings: Secondary | ICD-10-CM | POA: Diagnosis not present

## 2017-04-29 LAB — HEMOGLOBIN A1C: Hgb A1c MFr Bld: 5.5 % (ref 4.6–6.5)

## 2017-04-29 LAB — COMPREHENSIVE METABOLIC PANEL
ALT: 18 U/L (ref 0–53)
AST: 18 U/L (ref 0–37)
Albumin: 3.6 g/dL (ref 3.5–5.2)
Alkaline Phosphatase: 71 U/L (ref 39–117)
BUN: 44 mg/dL — ABNORMAL HIGH (ref 6–23)
CO2: 29 mEq/L (ref 19–32)
Calcium: 8.9 mg/dL (ref 8.4–10.5)
Chloride: 101 mEq/L (ref 96–112)
Creatinine, Ser: 2.17 mg/dL — ABNORMAL HIGH (ref 0.40–1.50)
GFR: 31.69 mL/min — ABNORMAL LOW (ref >60.00)
Glucose, Bld: 106 mg/dL — ABNORMAL HIGH (ref 70–99)
Potassium: 4.8 mEq/L (ref 3.5–5.1)
Sodium: 138 mEq/L (ref 135–145)
Total Bilirubin: 0.5 mg/dL (ref 0.2–1.2)
Total Protein: 6.9 g/dL (ref 6.0–8.3)

## 2017-04-29 LAB — RENAL FUNCTION PANEL
Albumin: 3.6 g/dL (ref 3.5–5.2)
BUN: 44 mg/dL — ABNORMAL HIGH (ref 6–23)
CO2: 29 mEq/L (ref 19–32)
Calcium: 8.9 mg/dL (ref 8.4–10.5)
Chloride: 101 mEq/L (ref 96–112)
Creatinine, Ser: 2.17 mg/dL — ABNORMAL HIGH (ref 0.40–1.50)
GFR: 31.69 mL/min — ABNORMAL LOW (ref >60.00)
Glucose, Bld: 106 mg/dL — ABNORMAL HIGH (ref 70–99)
Phosphorus: 4.6 mg/dL (ref 2.3–4.6)
Potassium: 4.8 mEq/L (ref 3.5–5.1)
Sodium: 138 mEq/L (ref 135–145)

## 2017-04-29 LAB — MAGNESIUM: Magnesium: 2.6 mg/dL — ABNORMAL HIGH (ref 1.5–2.5)

## 2017-04-29 LAB — VITAMIN D 25 HYDROXY (VIT D DEFICIENCY, FRACTURES): VITD: 43.59 ng/mL (ref 30.00–100.00)

## 2017-04-29 MED ORDER — CLOTRIMAZOLE-BETAMETHASONE 1-0.05 % EX CREA: TOPICAL_CREAM | Freq: Two times a day (BID) | CUTANEOUS | 2 refills | Status: DC | PRN

## 2017-04-29 MED ORDER — INSULIN GLARGINE 100 UNIT/ML ~~LOC~~ SOLN: 50.0000 [IU] | Freq: Every day | SUBCUTANEOUS | 2 refills | Status: DC

## 2017-04-29 MED ORDER — LOSARTAN POTASSIUM 50 MG PO TABS: 25.0000 mg | ORAL_TABLET | Freq: Every day | ORAL | 2 refills | Status: DC

## 2017-04-29 MED ORDER — CLOTRIMAZOLE-BETAMETHASONE 1-0.05 % EX CREA: TOPICAL_CREAM | Freq: Two times a day (BID) | CUTANEOUS | 1 refills | Status: DC | PRN

## 2017-04-29 MED ORDER — INSULIN GLARGINE 100 UNIT/ML ~~LOC~~ SOLN: 50.0000 [IU] | Freq: Every day | SUBCUTANEOUS | 1 refills | Status: DC

## 2017-04-29 MED ORDER — GLIMEPIRIDE 4 MG PO TABS: 4.0000 mg | ORAL_TABLET | Freq: Every day | ORAL | 1 refills | Status: DC

## 2017-04-29 MED ORDER — GLIMEPIRIDE 4 MG PO TABS: 4.0000 mg | ORAL_TABLET | Freq: Every day | ORAL | 2 refills | Status: DC

## 2017-04-29 MED ORDER — TORSEMIDE 100 MG PO TABS: 25.0000 mg | ORAL_TABLET | Freq: Every day | ORAL | 1 refills | Status: DC

## 2017-04-29 MED ORDER — TORSEMIDE 100 MG PO TABS: 25.0000 mg | ORAL_TABLET | Freq: Every day | ORAL | 2 refills | Status: DC

## 2017-04-29 NOTE — Patient Instructions (Addendum)
Mr. Coiner , Thank you for taking time to come for your Medicare Wellness Visit. I appreciate your ongoing commitment to your health goals. Please review the following plan we discussed and let me know if I can assist you in the future.   These are the goals we discussed: Goals    . Get better sleep.       This is a list of the screening recommended for you and due dates:  Health Maintenance  Topic Date Due  . Eye exam for diabetics  08[birth year]15/1950  . Pneumonia vaccines (1 of 2 - PCV13) 08/16/2007  . Colon Cancer Screening  04/29/2018*  . Tetanus Vaccine  04/29/2018*  . Hemoglobin A1C  06/13/2017  . Complete foot exam   04/29/2018  . Flu Shot  Completed  *Topic was postponed. The date shown is not the original due date.   Preventive Care for Adults  A healthy lifestyle and preventive care can promote health and wellness. Preventive health guidelines for adults include the following key practices.  . A routine yearly physical is a good way to check with your health care provider about your health and preventive screening. It is a chance to share any concerns and updates on your health and to receive a thorough exam.  . Visit your dentist for a routine exam and preventive care every 6 months. Brush your teeth twice a day and floss once a day. Good oral hygiene prevents tooth decay and gum disease.  . The frequency of eye exams is based on your age, health, family medical history, use  of contact lenses, and other factors. Follow your health care provider's ecommendations for frequency of eye exams.  . Eat a healthy diet. Foods like vegetables, fruits, whole grains, low-fat dairy products, and lean protein foods contain the nutrients you need without too many calories. Decrease your intake of foods high in solid fats, added sugars, and salt. Eat the right amount of calories for you. Get information about a proper diet from your health care provider, if necessary.  . Regular physical  exercise is one of the most important things you can do for your health. Most adults should get at least 150 minutes of moderate-intensity exercise (any activity that increases your heart rate and causes you to sweat) each week. In addition, most adults need muscle-strengthening exercises on 2 or more days a week.  Silver Sneakers may be a benefit available to you. To determine eligibility, you may visit the website: www.silversneakers.com or contact program at 9862490499 Mon-Fri between 8AM-8PM.   . Maintain a healthy weight. The body mass index (BMI) is a screening tool to identify possible weight problems. It provides an estimate of body fat based on height and weight. Your health care provider can find your BMI and can help you achieve or maintain a healthy weight.   For adults 20 years and older: ? A BMI below 18.5 is considered underweight. ? A BMI of 18.5 to 24.9 is normal. ? A BMI of 25 to 29.9 is considered overweight. ? A BMI of 30 and above is considered obese.   . Maintain normal blood lipids and cholesterol levels by exercising and minimizing your intake of saturated fat. Eat a balanced diet with plenty of fruit and vegetables. Blood tests for lipids and cholesterol should begin at age 51 and be repeated every 5 years. If your lipid or cholesterol levels are high, you are over 50, or you are at high risk for heart disease, you  may need your cholesterol levels checked more frequently. Ongoing high lipid and cholesterol levels should be treated with medicines if diet and exercise are not working.  . If you smoke, find out from your health care provider how to quit. If you do not use tobacco, please do not start.  . If you choose to drink alcohol, please do not consume more than 2 drinks per day. One drink is considered to be 12 ounces (355 mL) of beer, 5 ounces (148 mL) of wine, or 1.5 ounces (44 mL) of liquor.  . If you are 42-30 years old, ask your health care provider if you  should take aspirin to prevent strokes.  . Use sunscreen. Apply sunscreen liberally and repeatedly throughout the day. You should seek shade when your shadow is shorter than you. Protect yourself by wearing long sleeves, pants, a wide-brimmed hat, and sunglasses year round, whenever you are outdoors.  . Once a month, do a whole body skin exam, using a mirror to look at the skin on your back. Tell your health care provider of new moles, moles that have irregular borders, moles that are larger than a pencil eraser, or moles that have changed in shape or color.

## 2017-05-04 DIAGNOSIS — I129 Hypertensive chronic kidney disease with stage 1 through stage 4 chronic kidney disease, or unspecified chronic kidney disease: Secondary | ICD-10-CM | POA: Diagnosis not present

## 2017-05-04 DIAGNOSIS — D631 Anemia in chronic kidney disease: Secondary | ICD-10-CM | POA: Diagnosis not present

## 2017-05-04 DIAGNOSIS — Z6835 Body mass index (BMI) 35.0-35.9, adult: Secondary | ICD-10-CM | POA: Diagnosis not present

## 2017-05-04 DIAGNOSIS — N183 Chronic kidney disease, stage 3 (moderate): Secondary | ICD-10-CM | POA: Diagnosis not present

## 2017-05-04 DIAGNOSIS — N2581 Secondary hyperparathyroidism of renal origin: Secondary | ICD-10-CM | POA: Diagnosis not present

## 2017-05-04 NOTE — Progress Notes (Signed)
I have personally reviewed the Medicare Annual Wellness questionnaire and have noted 1. The patient's medical and social history 2. Their use of alcohol, tobacco or illicit drugs 3. Their current medications and supplements 4. The patient's functional ability including ADL's, fall risks, home safety risks and hearing or visual impairment. 5. Diet and physical activities 6. Evidence for depression or mood disorders 7. Reviewed Updated provider list, see scanned forms and [facility] Snapshot.   The patients weight, height, BMI and visual acuity have been recorded in the chart I have made referrals, counseling and provided education to the patient based review of the above and I have provided the pt with a written personalized care plan for preventive services.  I have provided the patient with a copy of your personalized plan for preventive services. Instructed to take the time to review along with their updated medication list.   Marianna Cid, D.O. Family Medicine [facility]  

## 2017-05-04 NOTE — Progress Notes (Signed)
Done. Thanks! EW

## 2017-05-13 ENCOUNTER — Encounter: Payer: Self-pay | Admitting: Family Medicine

## 2017-05-13 LAB — FERRITIN: Ferritin: 98

## 2017-06-01 ENCOUNTER — Ambulatory Visit (INDEPENDENT_AMBULATORY_CARE_PROVIDER_SITE_OTHER): Payer: Medicare Other | Admitting: Family Medicine

## 2017-06-01 ENCOUNTER — Encounter: Payer: Self-pay | Admitting: Family Medicine

## 2017-06-01 ENCOUNTER — Other Ambulatory Visit: Payer: Self-pay

## 2017-06-01 VITALS — BP 106/58 | HR 69 | Temp 97.4°F | Ht 62.0 in | Wt 188.0 lb

## 2017-06-01 DIAGNOSIS — J984 Other disorders of lung: Secondary | ICD-10-CM | POA: Diagnosis not present

## 2017-06-01 DIAGNOSIS — E1122 Type 2 diabetes mellitus with diabetic chronic kidney disease: Secondary | ICD-10-CM

## 2017-06-01 DIAGNOSIS — D631 Anemia in chronic kidney disease: Secondary | ICD-10-CM | POA: Diagnosis not present

## 2017-06-01 DIAGNOSIS — IMO0001 Reserved for inherently not codable concepts without codable children: Secondary | ICD-10-CM

## 2017-06-01 DIAGNOSIS — N183 Chronic kidney disease, stage 3 unspecified: Secondary | ICD-10-CM

## 2017-06-01 DIAGNOSIS — Z794 Long term (current) use of insulin: Secondary | ICD-10-CM

## 2017-06-01 DIAGNOSIS — E1129 Type 2 diabetes mellitus with other diabetic kidney complication: Secondary | ICD-10-CM

## 2017-06-01 DIAGNOSIS — I129 Hypertensive chronic kidney disease with stage 1 through stage 4 chronic kidney disease, or unspecified chronic kidney disease: Secondary | ICD-10-CM

## 2017-06-02 DIAGNOSIS — J984 Other disorders of lung: Secondary | ICD-10-CM | POA: Insufficient documentation

## 2017-06-02 NOTE — Progress Notes (Signed)
Lucas Price is a 76 y.o. male is here for follow up.  History of Present Illness:   HPI: See Assessment and Plan section for Problem Based Charting of issues discussed today.  Health Maintenance Due  Topic Date Due  . OPHTHALMOLOGY EXAM  08/15/1950  . PNA vac Low Risk Adult (1 of 2 - PCV13) 08/16/2007   Depression screen PHQ 2/9 04/29/2017  Decreased Interest 0  Down, Depressed, Hopeless 0  PHQ - 2 Score 0   PMHx, SurgHx, SocialHx, FamHx, Medications, and Allergies were reviewed in the Visit Navigator and updated as appropriate.   Patient Active Problem List   Diagnosis Date Noted  . 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 Renal calculus 03/19/2015  . Anemia in chronic kidney disease (CKD) 05/26/2014  . CKD (chronic kidney disease) stage 3, GFR 30-59 ml/min (HCC) 05/26/2014  . COPD (chronic obstructive pulmonary disease) ([place]) 05/26/2014  . 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 Former smoker 05/26/2014  . GERD (gastroesophageal reflux disease) 05/26/2014  . 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 Bladder neck obstruction 04/24/2014  . Recurrent urinary tract infection 09/05/2013  . Retention of urine 09/05/2013   Social History  Substance Use Topics  . Smoking status: Never Smoker  . Smokeless tobacco: Never Used  . Alcohol use No   Current Medications and Allergies:   .  allopurinol (ZYLOPRIM) 100 MG tablet, Take 100 mg by mouth daily. , Disp: , Rfl:  .  aspirin EC 81 MG tablet, Take 81 mg by mouth daily. , Disp: , Rfl:  .  calcium carbonate (CALCIUM 600) 600 MG TABS tablet, Take 600 mg by mouth daily. , Disp: , Rfl:  .  Cholecalciferol (D 10000) 10000 units CAPS, Take by mouth 2 (two) times a week. , Disp: , Rfl:  .  Coenzyme Q-10 200 MG CAPS, Take 200 mg by mouth daily. , Disp: , Rfl:  .  Continuous Blood Gluc Sensor  ([place]) MISC, Per instructions., Disp: 1 each, Rfl: 2 .  glimepiride (AMARYL) 4 MG tablet, Take 1 tablet (4 mg total) by mouth daily with breakfast., Disp: 90 tablet, Rfl: 1 .  HYDROcodone-acetaminophen (NORCO) 10-325 MG tablet, Take 1 tablet by mouth every 8 (eight) hours as needed., Disp: 40 tablet, Rfl: 0 .  insulin glargine (LANTUS) 100 UNIT/ML injection, Inject 0.5 mLs (50 Units total) into the skin at bedtime., Disp: 5 vial, Rfl: 1 .  Insulin Syringe-Needle U-100 (RELION INSULIN SYRINGE 1ML/31G) 31G X 5/16" 1 ML MISC, 1 Syringe by Does not apply route 3 (three) times daily., Disp: 300 each, Rfl: 1 .  losartan (COZAAR) 50 MG tablet, Take 0.5-1 tablets (25-50 mg total) by mouth daily., Disp: 30 tablet, Rfl: 2 .  metoprolol (TOPROL-XL) 200 MG 24 hr tablet, Take 200 mg by mouth daily. , Disp: , Rfl: - TAKING 50 MG DAILY .  sitaGLIPtin (JANUVIA) 100 MG tablet, Take 100mg  daily, Disp: , Rfl:  .  Syringe, Disposable, 1 ML MISC, Use with insulin needle., Disp: 100 each, Rfl: 3 .  torsemide (DEMADEX) 100 MG tablet, Take 0.5 tablets (50 mg total) by mouth daily., Disp: 90 tablet, Rfl: 1 .  Travoprost, BAK Free, (TRAVATAN Z) 0.004 % SOLN ophthalmic solution, Use as directed, Disp: , Rfl:  .  triamcinolone cream (KENALOG) 0.1 %, Use as directed as needed, Disp: ,  Rfl:  .  Continuous Blood Gluc Receiver (FREESTYLE LIBRE READER) DEVI, 1 Device by Does not apply route once.  No Known Allergies   Review of Systems   Pertinent items are noted in the HPI. Otherwise, ROS is negative.  Vitals:   Vitals:   06/01/17 1502  BP: (!) 106/58  Pulse: 69  Temp: (!) 97.4 F (36.3 C)  TempSrc: Oral  SpO2: 95%  Weight: 188 lb (85.3 kg)  Height: 5\' 2"  (1.575 m)     Body mass index is 34.39 kg/m.   Physical Exam:   Physical Exam  Constitutional: He is oriented to person, place, and time. He appears well-developed and well-nourished. No distress.  HENT:  Head: Normocephalic and  atraumatic.  Right Ear: External ear normal.  Left Ear: External ear normal.  Nose: Nose normal.  Mouth/Throat: Oropharynx is clear and moist.  Eyes: Pupils are equal, round, and reactive to light. Conjunctivae and EOM are normal.  Neck: Normal range of motion. Neck supple.  Cardiovascular: Normal rate, regular rhythm, normal heart sounds and intact distal pulses.   Pulmonary/Chest: Effort normal and breath sounds normal.  Abdominal: Soft. Bowel sounds are normal.  Musculoskeletal: Normal range of motion.  Neurological: He is alert and oriented to person, place, and time.  Skin: Skin is warm and dry.  Psychiatric: He has a normal mood and affect. His behavior is normal. Judgment and thought content normal.  Nursing note and vitals reviewed.   Results for orders placed or performed in visit on 05/13/17  CBC and differential  Result Value Ref Range   Hemoglobin 12.6 (A) 13.5 - 17.5   HCT 37 (A) 41 - 53   Platelets 198 150 - 399   WBC 10.8   VITAMIN D 25 Hydroxy (Vit-D Deficiency, Fractures)  Result Value Ref Range   Vit D, 25-Hydroxy 35.7   Ferritin  Result Value Ref Range   Ferritin 98    Assessment and Plan:   Lucas Price was seen today for annual exam.  Diagnoses and all orders for this visit:  Restrictive lung disease Comments: On chronic 2LNC.  Orders: -     Ambulatory referral to Pulmonology  Anemia in stage 3 chronic kidney disease ([place]) Comments:  CBC Latest Ref Rng & Units 01/27/2017 01/27/2017 12/11/2016  WBC - 10.8 10.8 12.4(H)  Hemoglobin 13.5 - 17.5 12.6(A) 12.6(A) 12.4(L)  Hematocrit 41 - 53 37(A) 37(A) 38.1(L)  Platelets 150 - 399 198 198 180.0 Repeated and verified X2.   CKD (chronic kidney disease) stage 3, GFR 30-59 ml/min (HCC) Comments: Assessment: CKD: stage 3 - GFR 30-59 stable. Plan: cardiovascular risk factor reduction, glycemic control, blood pressure control, foot care, eye care, recognition and treatment of hypoglycemia, avoid NSAIDs, consult  physician early if nausea, vomiting, or diarrhea and avoid IV contrast.   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  Taking medication compliantly without noted sided effects []   YES  []   NO   Episodes of hypoglycemia? []   YES  [x]   NO Maintaining a diabetic diet? [x]   YES  []   NO Trying to exercise on a regular basis? []   YES  [x]   NO  On ACE inhibitor or angiotensin II receptor blocker? [x]   YES  []   NO On Aspirin? 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  Avoiding excessive salt intake? [x]   YES  []   NO Trying to exercise on a regular basis? []   YES  [x]   NO Review: taking medications as instructed, no medication side effects noted, no TIAs, no chest pain on exertion, no dyspnea on exertion, no swelling of ankles.   Wt Readings from Last 3 Encounters:  06/01/17 188 lb (85.3 kg)  04/29/17 189 lb 12.8 oz (86.1 kg)  12/29/16 205 lb 6.4 oz (93.2 kg)   Reports that he has never smoked. He has never used smokeless tobacco.  BP Readings from Last 3  Encounters:  06/01/17 (!) 106/58  04/29/17 (!) 110/58  12/29/16 (!) 104/58    . Reviewed expectations re: course of current medical issues. . Discussed self-management of symptoms. . Outlined signs and symptoms indicating need for more acute intervention. . Patient verbalized understanding and all questions were answered. [name] Health Maintenance issues including appropriate healthy diet, exercise, and smoking avoidance were discussed with patient. . See orders for this visit as documented in the electronic medical record. . Patient received an After Visit Summary.   Briscoe Deutscher, DO [place], [facility] 06/02/2017

## 2017-06-24 ENCOUNTER — Telehealth: Payer: Self-pay | Admitting: Family Medicine

## 2017-06-24 NOTE — Telephone Encounter (Signed)
Dr [name] has not prescribed this before. Please advise.

## 2017-06-24 NOTE — Telephone Encounter (Signed)
MEDICATION:  allopurinol (ZYLOPRIM) 100 MG tablet   PHARMACY:   [facility], [place] [phone] (Phone) [phone] (Fax)    IS THIS A 90 DAY SUPPLY : Y  IS PATIENT OUT OF MEDICATION: N  IF NOT; HOW MUCH IS LEFT: few days left   LAST APPOINTMENT DATE: @10 /22/2018  NEXT APPOINTMENT DATE:@Visit  date not found  OTHER COMMENTS:    **Let patient know to contact pharmacy at the end of the day to make sure medication is ready. **  ** Please notify patient to allow 48-72 hours to process**  **Encourage patient to contact the pharmacy for refills or they can request refills through [employer]**

## 2017-06-24 NOTE — Telephone Encounter (Signed)
Okay refill. 

## 2017-06-25 MED ORDER — ALLOPURINOL 100 MG PO TABS: 100.0000 mg | ORAL_TABLET | Freq: Every day | ORAL | 1 refills | Status: DC

## 2017-06-25 NOTE — Telephone Encounter (Signed)
RX sent to pharmacy  

## 2017-06-25 NOTE — Addendum Note (Signed)
Addended by: Durwin Glaze on: 06/25/2017 08:55 AM   Modules accepted: Orders

## 2017-07-13 ENCOUNTER — Encounter: Payer: Self-pay | Admitting: Internal Medicine

## 2017-07-13 ENCOUNTER — Ambulatory Visit (INDEPENDENT_AMBULATORY_CARE_PROVIDER_SITE_OTHER): Payer: Medicare Other | Admitting: Internal Medicine

## 2017-07-13 VITALS — BP 112/60 | HR 72 | Ht 63.0 in | Wt 186.8 lb

## 2017-07-13 DIAGNOSIS — D638 Anemia in other chronic diseases classified elsewhere: Secondary | ICD-10-CM | POA: Diagnosis not present

## 2017-07-13 DIAGNOSIS — R0602 Shortness of breath: Secondary | ICD-10-CM

## 2017-07-13 DIAGNOSIS — N189 Chronic kidney disease, unspecified: Secondary | ICD-10-CM | POA: Diagnosis not present

## 2017-07-13 DIAGNOSIS — J984 Other disorders of lung: Secondary | ICD-10-CM | POA: Diagnosis not present

## 2017-07-13 DIAGNOSIS — R0609 Other forms of dyspnea: Secondary | ICD-10-CM | POA: Diagnosis not present

## 2017-07-13 NOTE — Progress Notes (Signed)
Subjective:     Patient ID: Lucas Price, male   DOB: 04-Oct-1940, 76 y.o.   MRN: [id number] PCP Briscoe Deutscher, DO  HPI    IOV 07/13/2017  Chief Complaint  Patient presents with  . Advice Only    Referred by Dr. [name] due to SOB. Denies any complaints of CP but states that he has complaints of mild chronic cough. Pt states that he wears O2 at home, DME: [employer] 18.25L   76 year old male. Immigrant from [place]. Moved to the [place] in 1969. 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 Moved to [facility] for years ago to be with his son who owns [employer] stores in [place]. 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 Starting some 4 years ago when he moved to [place] he started losing weight intentionally and is lost from 225 pounds 285 pounds currently. He tells me that approximately 20 years ago he was diagnosed with restrictive lung disease" and is been on inhaler therapy since then. He says the restriction is on account of his obesity. But at the same time he also tells me that he probably has interstitial lung disease. He is also on inhaler therapy which he says is not helping him. He is not sure if that his asthma or COPD although he is not smoking other than very limited smoking many years ago in his younger days. He's been on BiPAP with oxygen at night for the last few to several years through [facility] but he says his sleep study was negative for years ago. He does not have a pulmonologist here currently he has dyspnea on exertion and some cough that is mild to moderate in severity but overall stable. He is here to establish with pulmonologist. He does admit that sometimes when he walks he desaturates into the 60s. With our office he only walked half lap and he says he is impressed that he is able to walk that far  Walking desaturation test on an 85 feet 3 laps o 0 n room air with a full head probe  will and he walked with a cane he did he only walked half lap and got dyspneic and wanted to stop. His resting heart rate was 72 and he hit a peak heart rate of 111 and just have her lab. His resting pulse ox was 97% and he dropped to 91% in half lap when he stopped.  Review of the chart also shows he has mild anemia of chronic disease and chronic kidney disease  Results for PACEY, ALTIZER (MRN [id number]) as of 07/13/2017 14:37  Ref. Range 12/12/2016 12:58 01/27/2017 00:00 01/27/2017 00:00 04/29/2017 09:53 04/29/2017 09:53  Creatinine Latest Ref Range: 0.40 - 1.50 mg/dL  2.0 (A)  2.17 (H) 2.17 (H)  Results for DIOGO, ANNE (MRN [id number]) as of 07/13/2017 14:37  Ref. Range 12/12/2016 12:58 01/27/2017 00:00 01/27/2017 00:00 04/29/2017 09:53 04/29/2017 09:53  Hemoglobin Latest Ref Range: 13.5 - 17.5   12.6 (A) 12.6 (A)       has a past medical history of Anemia (05/26/2014), Bladder neck obstruction (04/24/2014), CKD (chronic kidney disease) (05/26/2014), COPD (chronic obstructive pulmonary disease) ([place]) (05/26/2014), Diabetes mellitus ([place]) (05/26/2014), Former smoker (05/26/2014), GERD (gastroesophageal reflux disease) (05/26/2014), HTN (hypertension) (05/26/2014), Prune belly syndrome (05/26/2014), Recurrent urinary tract infection (09/05/2013), Renal calculus (03/19/2015), and Retention of urine (09/05/2013).   reports that  has never smoked. he has  never used smokeless tobacco.  No past surgical history on file.  No Known Allergies  Immunization History  Administered Date(s) Administered  . Influenza, High Dose Seasonal PF 04/29/2017    No family history on file.   Current Outpatient Medications:  .  allopurinol (ZYLOPRIM) 100 MG tablet, Take 1 tablet (100 mg total) daily by mouth., Disp: 90 tablet, Rfl: 1 .  aspirin EC 81 MG tablet, Take 81 mg by mouth daily. , Disp: , Rfl:  .  budesonide-formoterol (SYMBICORT) 160-4.5 MCG/ACT inhaler, Inhale 2 puffs into the lungs 2 (two) times daily., Disp: ,  Rfl:  .  calcium carbonate (CALCIUM 600) 600 MG TABS tablet, Take 600 mg by mouth daily. , Disp: , Rfl:  .  Cholecalciferol (D 10000) 10000 units CAPS, Take by mouth 2 (two) times a week. , Disp: , Rfl:  .  Coenzyme Q-10 200 MG CAPS, Take 200 mg by mouth daily. , Disp: , Rfl:  .  Continuous Blood Gluc Sensor ([place]) MISC, Per instructions., Disp: 1 each, Rfl: 2 .  glimepiride (AMARYL) 4 MG tablet, Take 1 tablet (4 mg total) by mouth daily with breakfast., Disp: 90 tablet, Rfl: 1 .  insulin glargine (LANTUS) 100 UNIT/ML injection, Inject 0.5 mLs (50 Units total) into the skin at bedtime., Disp: 5 vial, Rfl: 1 .  Insulin Syringe-Needle U-100 (RELION INSULIN SYRINGE 1ML/31G) 31G X 5/16" 1 ML MISC, 1 Syringe by Does not apply route 3 (three) times daily., Disp: 300 each, Rfl: 1 .  losartan (COZAAR) 50 MG tablet, Take 0.5-1 tablets (25-50 mg total) by mouth daily., Disp: 30 tablet, Rfl: 2 .  metoprolol (TOPROL-XL) 200 MG 24 hr tablet, Take 200 mg by mouth daily. , Disp: , Rfl:  .  sitaGLIPtin (JANUVIA) 100 MG tablet, Take 100mg  daily, Disp: , Rfl:  .  Syringe, Disposable, 1 ML MISC, Use with insulin needle., Disp: 100 each, Rfl: 3 .  Tiotropium Bromide Monohydrate (SPIRIVA RESPIMAT) 2.5 MCG/ACT AERS, Inhale 2 puffs into the lungs daily., Disp: , Rfl:  .  torsemide (DEMADEX) 100 MG tablet, Take 0.5 tablets (50 mg total) by mouth daily., Disp: 90 tablet, Rfl: 1 .  Travoprost, BAK Free, (TRAVATAN Z) 0.004 % SOLN ophthalmic solution, Use as directed, Disp: , Rfl:  .  triamcinolone cream (KENALOG) 0.1 %, Use as directed as needed, Disp: , Rfl:  .  Continuous Blood Gluc Receiver (FREESTYLE LIBRE READER) DEVI, 1 Device by Does not apply route once., Disp: 1 Device, Rfl: 0    Review of Systems     Objective:   Physical Exam  Constitutional: He is oriented to person, place, and time. He appears well-developed and well-nourished. No distress.  Obese, hs cane  HENT:  Head:  Normocephalic and atraumatic.  Right Ear: External ear normal.  Left Ear: External ear normal.  Mouth/Throat: Oropharynx is clear and moist. No oropharyngeal exudate.  Eyes: Conjunctivae and EOM are normal. Pupils are equal, round, and reactive to light. Right eye exhibits no discharge. Left eye exhibits no discharge. No scleral icterus.  Neck: Normal range of motion. Neck supple. No JVD present. No tracheal deviation present. No thyromegaly present.  Cardiovascular: Normal rate, regular rhythm and intact distal pulses. Exam reveals no gallop and no friction rub.  No murmur heard. Pulmonary/Chest: Effort normal and breath sounds normal. No respiratory distress. He has no wheezes. He has no rales. He exhibits no tenderness.  Mild kyhpotic  Abdominal: Soft. Bowel sounds are normal. He exhibits  no distension and no mass. There is no tenderness. There is no rebound and no guarding.  Huge visceral obesity  Musculoskeletal: Normal range of motion. He exhibits edema. He exhibits no tenderness.  Lymphadenopathy:    He has no cervical adenopathy.  Neurological: He is alert and oriented to person, place, and time. He has normal reflexes. No cranial nerve deficit. Coordination normal.  Skin: Skin is warm and dry. No rash noted. He is not diaphoretic. No erythema. No pallor.  Psychiatric: He has a normal mood and affect. His behavior is normal. Judgment and thought content normal.  Nursing note and vitals reviewed.  Vitals:   07/13/17 1430  BP: 112/60  Pulse: 72  SpO2: 95%  Weight: 186 lb 12.8 oz (84.7 kg)  Height: 5\' 3"  (1.6 m)    Estimated body mass index is 33.09 kg/m as calculated from the following:   Height as of this encounter: 5\' 3"  (1.6 m).   Weight as of this encounter: 186 lb 12.8 oz (84.7 kg).     Assessment:       ICD-10-CM   1. Dyspnea on exertion R06.09   2. Anemia, chronic disease D63.8   3. Chronic kidney disease, unspecified CKD stage N18.9   4. Restrictive lung disease  J98.4   5. Shortness of breath R06.02 CT Chest High Resolution       Plan:      Do cbc with diff, Do IgE and Do bmet blood work Do aBG on room air Do full PFT Do HRCT supine and prone  Followup Next few to several weeks with DR Chase Caller but after completing above; consider feno testing if above negative   Dr. Brand Males, M.D., [facility].C.P Pulmonary and Critical Care Medicine Staff Physician, [place] Director - Interstitial Lung Disease  Program  Pulmonary [place] at [place], [place], [address]  Pager: [phone], If no answer or between  15:00h - 7:00h: call [phone] Telephone: [phone]

## 2017-07-13 NOTE — Patient Instructions (Addendum)
ICD-10-CM   1. Dyspnea on exertion R06.09   2. Anemia, chronic disease D63.8   3. Chronic kidney disease, unspecified CKD stage N18.9   4. Restrictive lung disease J98.4     Do cbc with diff, Do IgE and Do bmet blood work Do aBG on room air Do full PFT Do HRCT supine and prone  Followup Next few to several weeks with DR Chase Caller but after completing above

## 2017-07-14 ENCOUNTER — Other Ambulatory Visit (INDEPENDENT_AMBULATORY_CARE_PROVIDER_SITE_OTHER): Payer: Medicare Other

## 2017-07-14 DIAGNOSIS — R0609 Other forms of dyspnea: Secondary | ICD-10-CM | POA: Diagnosis not present

## 2017-07-14 LAB — CBC WITH DIFFERENTIAL/PLATELET
BASOS ABS: 0 10*3/uL (ref 0.0–0.1)
BASOS PCT: 0.4 % (ref 0.0–3.0)
EOS PCT: 2.4 % (ref 0.0–5.0)
Eosinophils Absolute: 0.3 10*3/uL (ref 0.0–0.7)
HEMATOCRIT: 40.9 % (ref 39.0–52.0)
HEMOGLOBIN: 13.2 g/dL (ref 13.0–17.0)
LYMPHS PCT: 15.3 % (ref 12.0–46.0)
Lymphs Abs: 1.6 10*3/uL (ref 0.7–4.0)
MCHC: 32.2 g/dL (ref 30.0–36.0)
MCV: 95.5 fl (ref 78.0–100.0)
MONO ABS: 0.7 10*3/uL (ref 0.1–1.0)
MONOS PCT: 6.7 % (ref 3.0–12.0)
Neutro Abs: 8 10*3/uL — ABNORMAL HIGH (ref 1.4–7.7)
Neutrophils Relative %: 75.2 % (ref 43.0–77.0)
Platelets: 195 10*3/uL (ref 150.0–400.0)
RBC: 4.29 Mil/uL (ref 4.22–5.81)
RDW: 16.2 % — ABNORMAL HIGH (ref 11.5–15.5)
WBC: 10.7 10*3/uL — AB (ref 4.0–10.5)

## 2017-07-14 LAB — BASIC METABOLIC PANEL
BUN: 58 mg/dL — AB (ref 6–23)
CHLORIDE: 104 meq/L (ref 96–112)
CO2: 25 mEq/L (ref 19–32)
Calcium: 8.5 mg/dL (ref 8.4–10.5)
Creatinine, Ser: 2.1 mg/dL — ABNORMAL HIGH (ref 0.40–1.50)
GFR: 32.9 mL/min — ABNORMAL LOW (ref >60.00)
Glucose, Bld: 124 mg/dL — ABNORMAL HIGH (ref 70–99)
POTASSIUM: 4.8 meq/L (ref 3.5–5.1)
Sodium: 137 mEq/L (ref 135–145)

## 2017-07-15 LAB — IGE: IgE (Immunoglobulin E), Serum: 218 kU/L — ABNORMAL HIGH (ref <=114)

## 2017-07-22 ENCOUNTER — Encounter (HOSPITAL_COMMUNITY): Payer: Self-pay

## 2017-07-22 ENCOUNTER — Encounter (HOSPITAL_COMMUNITY): Payer: Medicare Other

## 2017-07-22 ENCOUNTER — Ambulatory Visit (HOSPITAL_COMMUNITY)
Admission: RE | Admit: 2017-07-22 | Discharge: 2017-07-22 | Disposition: A | Discharge status: Home / Self Care | Payer: Medicare Other | Source: Ambulatory Visit | Attending: Internal Medicine | Admitting: Internal Medicine

## 2017-07-22 DIAGNOSIS — R0602 Shortness of breath: Secondary | ICD-10-CM | POA: Insufficient documentation

## 2017-07-22 DIAGNOSIS — I700 Atherosclerosis of aorta: Secondary | ICD-10-CM | POA: Insufficient documentation

## 2017-07-22 DIAGNOSIS — R918 Other nonspecific abnormal finding of lung field: Secondary | ICD-10-CM | POA: Diagnosis not present

## 2017-07-22 DIAGNOSIS — I2510 Atherosclerotic heart disease of native coronary artery without angina pectoris: Secondary | ICD-10-CM | POA: Diagnosis not present

## 2017-07-22 DIAGNOSIS — R0609 Other forms of dyspnea: Principal | ICD-10-CM

## 2017-07-22 LAB — PULMONARY FUNCTION TEST
DL/VA % pred: 39 %
DL/VA: 1.62 ml/min/mmHg/L
DLCO COR % PRED: 16 %
DLCO UNC % PRED: 15 %
DLCO cor: 3.74 ml/min/mmHg
DLCO unc: 3.61 ml/min/mmHg
FEF 25-75 PRE: 0.58 L/s
FEF 25-75 Post: 0.2 L/sec
FEF2575-%Change-Post: -65 %
FEV1-%Change-Post: -45 %
FEV1-Post: 0.56 L
FEV1-Pre: 1.02 L
FEV1FVC-%CHANGE-POST: -33 %
FEV6-%CHANGE-POST: -17 %
FEV6-POST: 1.34 L
FEV6-Pre: 1.63 L
FEV6FVC-%Change-Post: 0 %
FVC-%Change-Post: -17 %
FVC-POST: 1.34 L
FVC-PRE: 1.63 L
PRE FEV1/FVC RATIO: 63 %
PRE FEV6/FVC RATIO: 100 %
Post FEV1/FVC ratio: 42 %
Post FEV6/FVC ratio: 100 %

## 2017-07-22 MED ORDER — ALBUTEROL SULFATE (2.5 MG/3ML) 0.083% IN NEBU
2.5000 mg | INHALATION_SOLUTION | Freq: Once | RESPIRATORY_TRACT | Status: AC
  Administered 2017-07-22: 2.5 mg via RESPIRATORY_TRACT

## 2017-07-23 ENCOUNTER — Telehealth: Payer: Self-pay | Admitting: Internal Medicine

## 2017-07-23 DIAGNOSIS — R911 Solitary pulmonary nodule: Secondary | ICD-10-CM

## 2017-07-23 LAB — BLOOD GAS, ARTERIAL
ACID-BASE EXCESS: 1.9 mmol/L (ref 0.0–2.0)
BICARBONATE: 26.3 mmol/L (ref 20.0–28.0)
DRAWN BY: 33147
FIO2: 21
O2 SAT: 87.9 %
PATIENT TEMPERATURE: 98.6
PH ART: 7.407 (ref 7.350–7.450)
pCO2 arterial: 42.6 mmHg (ref 32.0–48.0)
pO2, Arterial: 55.1 mmHg — ABNORMAL LOW (ref 83.0–108.0)

## 2017-07-23 NOTE — Telephone Encounter (Signed)
Spoke with patient. He is aware of results. Explained to him that MR wanted the PET scan to be done before his visit on 12/19. Patient explained that he wanted to wait until after his appt to have the PET scan.   Will route to MR so he is aware.

## 2017-07-23 NOTE — Telephone Encounter (Signed)
Let patient know about CT scan chest results - being abnormal with lung nodules  Plan - Get super D of the CT scan chest made and give to [place] PET scan before he sees me on 07/29/17  Thanks  Dr. Brand Males, M.D., F.C.C.P Pulmonary and Critical Care Medicine Staff Physician, [place] Director - Interstitial Lung Disease  Program  Pulmonary [place] at [place], [place], [address]  Pager: [phone], If no answer or between  15:00h - 7:00h: call [phone] Telephone: [place]  Lab 07/22/17 1455  PHART 7.407  PCO2ART 42.6  PO2ART 55.1*  HCO3 26.3  O2SAT 87.9   Results for SPIRO, AUSBORN (MRN [id number]) as of 07/23/2017 13:31  Ref. Range 04/29/2017 09:53 07/14/2017 12:19 07/22/2017 13:29 07/22/2017 14:55 07/22/2017 15:13  Creatinine Latest Ref Range: 0.40 - 1.50 mg/dL 2.17 (H) 2.10 (H)     Results for KARTER, HELLMER (MRN [id number]) as of 07/23/2017 13:31  Ref. Range 01/27/2017 00:00 04/29/2017 09:53 04/29/2017 09:53 07/14/2017 12:19 07/22/2017 14:55  IgE (Immunoglobulin E), Serum Latest Ref Range: <OR=114 kU/L    218 (H)    CBC No results for input(s): HGB, HCT, WBC, PLT in the last 168 hours.  COAGULATION No results for input(s): INR in the last 168 hours.  CARDIAC  No results for input(s): TROPONINI in the last 168 hours. No results for input(s): PROBNP in the last 168 hours.   CHEMISTRY No results for input(s): NA, K, CL, CO2, GLUCOSE, BUN, CREATININE, CALCIUM, MG, PHOS in the last 168 hours. Estimated Creatinine Clearance: 29.7 mL/min (A) (by C-G formula based on SCr of 2.1 mg/dL (H)).   LIVER No results for input(s): AST, ALT, ALKPHOS, BILITOT, PROT, ALBUMIN, INR in the last 168 hours.   INFECTIOUS No results for input(s): LATICACIDVEN, PROCALCITON in the last 168 hours.   ENDOCRINE CBG (last 3)  No results for input(s): GLUCAP in the last 72  hours.       IMAGING x48h  - image(s) personally visualized  -   highlighted in bold Ct Chest High Resolution  Result Date: 07/22/2017 CLINICAL DATA:  76 year old male with history of persistent cough and shortness of breath for several years. Evaluate for interstitial lung disease. EXAM: CT CHEST WITHOUT CONTRAST TECHNIQUE: Multidetector CT imaging of the chest was performed following the standard protocol without intravenous contrast. High resolution imaging of the lungs, as well as inspiratory and expiratory imaging, was performed. COMPARISON:  No priors. FINDINGS: Cardiovascular: Heart size is normal. There is no significant pericardial fluid, thickening or pericardial calcification. There is aortic atherosclerosis, as well as atherosclerosis of the great vessels of the mediastinum and the coronary arteries, including calcified atherosclerotic plaque in the left main, left anterior descending and left circumflex coronary arteries. Mediastinum/Nodes: One mildly enlarged low right paratracheal lymph node measuring 15 mm in short axis (axial image 67 of series 2) is partially calcified. No other pathologically enlarged mediastinal or hilar lymph nodes. Please note that accurate exclusion of hilar adenopathy is limited on noncontrast CT scans. Esophagus is unremarkable in appearance. No axillary lymphadenopathy. Lungs/Pleura: 1.5 x 2.4 cm nodular appearing area of architectural distortion in the inferior aspect of the right middle lobe (axial image 106 of series 6) abuts the pleural surface both posteriorly and medially, and has some surrounding spiculations and regional architectural distortion, concerning for potential neoplasm. In  addition, in the medial aspect of the left lower lobe there is a subtle pleural-based nodule measuring 2.1 x 1.7 cm which is smoothly marginated (axial image 90 of series 2), but causes marked narrowing and/or complete occlusion of several adjacent bronchi. There is  significant volume loss in the left lower lobe, and lack of visualization of the left major fissure, such that the possibility that the perceived nodule in the medial aspect of the left lower lobe is actually complete chronic atelectasis of the left lower lobe should be considered. Several other tiny 2-4 mm pulmonary nodules are scattered randomly throughout the lungs bilaterally. Several additional areas of architectural distortion are noted throughout the lung bases, most evident in the medial right lower lobe and in the base of the left lung where there is extensive thickening of the peribronchovascular interstitium, volume loss and adjacent pleural thickening as well is a trace amount of left-sided pleural fluid, favored to reflect some chronic post infectious or inflammatory scarring. High-resolution images otherwise demonstrate no compelling areas of ground-glass attenuation, traction bronchiectasis or frank honeycombing. There is some mild septal thickening in the periphery of the left upper lobe which is an isolated finding. No definite acute consolidative airspace disease. Upper Abdomen: Aortic atherosclerosis. Probable right-sided extra renal pelvis (normal anatomical variant) incompletely imaged. Musculoskeletal: There are no aggressive appearing lytic or blastic lesions noted in the visualized portions of the skeleton. IMPRESSION: 1. No definite imaging findings strongly suggestive of interstitial lung disease. 2. However, there are 2 large pulmonary nodules in the inferior aspect of the right middle lobe and in the medial aspect of the left lower lobe, as detailed above. The lesion in the right middle lobe is most suspicious in appearance, potentially a primary bronchogenic neoplasm. As for the lesion in the left lower lobe, it is difficult to identify the major fissure in the left lung, and the possibility that this rounded area reflects complete chronic atelectasis of the left lower lobe should be  considered. Regardless, further evaluation with PET-CT is recommended to better evaluate these findings in the near future. 3. Mildly enlarged low right paratracheal lymph node which is partially calcified. Although this is favored to be benign, attention at forthcoming PET-CT is recommended. 4. Aortic atherosclerosis, in addition to left main and 2 vessel coronary artery disease. Assessment for potential risk factor modification, dietary therapy or pharmacologic therapy may be warranted, if clinically indicated. These results will be called to the ordering clinician or representative by the Radiologist Assistant, and communication documented in the PACS or zVision Dashboard. Aortic Atherosclerosis (ICD10-I70.0). Electronically Signed   By: Vinnie Langton M.D.   On: 07/22/2017 17:01

## 2017-07-23 NOTE — Telephone Encounter (Signed)
Ok no problem  Dr. Brand Males, M.D., [facility].C.P Pulmonary and Critical Care Medicine Staff Physician, [place] Director - Interstitial Lung Disease  Program  Pulmonary [place] at [place], [place], [address]  Pager: [phone], If no answer or between  15:00h - 7:00h: call [phone] Telephone: [phone]

## 2017-07-23 NOTE — Telephone Encounter (Signed)
Lucas Price with [name] calling in a call report on CT chest obtained yesterday.  Full report available in Epic, impression copied below:  --------- IMPRESSION: 1. No definite imaging findings strongly suggestive of interstitial lung disease. 2. However, there are 2 large pulmonary nodules in the inferior aspect of the right middle lobe and in the medial aspect of the left lower lobe, as detailed above. The lesion in the right middle lobe is most suspicious in appearance, potentially a primary bronchogenic neoplasm. As for the lesion in the left lower lobe, it is difficult to identify the major fissure in the left lung, and the possibility that this rounded area reflects complete chronic atelectasis of the left lower lobe should be considered. Regardless, further evaluation with PET-CT is recommended to better evaluate these findings in the near future. 3. Mildly enlarged low right paratracheal lymph node which is partially calcified. Although this is favored to be benign, attention at forthcoming PET-CT is recommended. 4. Aortic atherosclerosis, in addition to left main and 2 vessel coronary artery disease. Assessment for potential risk factor modification, dietary therapy or pharmacologic therapy may be warranted, if clinically indicated. These results will be called to the ordering clinician or representative by the Radiologist Assistant, and communication documented in the PACS or zVision Dashboard.  Aortic Atherosclerosis (ICD10-I70.0).   Electronically Signed   By: Vinnie Langton M.D.   On: 07/22/2017 17:01

## 2017-07-23 NOTE — Telephone Encounter (Signed)
Spoke with pt who stated that the PET scan has already been scheduled for 12/24 at 11:00.  Nothing further needed.

## 2017-07-29 ENCOUNTER — Encounter: Payer: Self-pay | Admitting: Internal Medicine

## 2017-07-29 ENCOUNTER — Ambulatory Visit (INDEPENDENT_AMBULATORY_CARE_PROVIDER_SITE_OTHER): Payer: Medicare Other | Admitting: Internal Medicine

## 2017-07-29 VITALS — BP 108/58 | HR 67 | Ht 63.0 in | Wt 185.6 lb

## 2017-07-29 DIAGNOSIS — I2510 Atherosclerotic heart disease of native coronary artery without angina pectoris: Secondary | ICD-10-CM | POA: Diagnosis not present

## 2017-07-29 DIAGNOSIS — J9611 Chronic respiratory failure with hypoxia: Secondary | ICD-10-CM | POA: Diagnosis not present

## 2017-07-29 DIAGNOSIS — H5789 Other specified disorders of eye and adnexa: Secondary | ICD-10-CM | POA: Diagnosis not present

## 2017-07-29 DIAGNOSIS — R918 Other nonspecific abnormal finding of lung field: Secondary | ICD-10-CM | POA: Diagnosis not present

## 2017-07-29 DIAGNOSIS — Z8709 Personal history of other diseases of the respiratory system: Secondary | ICD-10-CM | POA: Diagnosis not present

## 2017-07-29 DIAGNOSIS — I2584 Coronary atherosclerosis due to calcified coronary lesion: Secondary | ICD-10-CM

## 2017-07-29 NOTE — Patient Instructions (Addendum)
Multiple lung nodules on CT  - PET scan 08/03/17; will call with result after  New year - [place] to get CD rom of Curent CT in super D protocol and leave on my compuer   History of asthma - continue scheduled inhaler as before  Chronic respiratory failure with hypoxia ([place]) - continue o2 at night with bipap as before  Coronary artery calcification -  does have coronary artery calcificatio - please refer to cardiologist -to DR Dyann Kief [facility]  Eye Discharge  - per PCP Briscoe Deutscher, DO   Followup - next few weeks but after PET to discuss results w 0- return to see DR Chase Caller or DR Lamonte Sakai or APP [facility]

## 2017-07-29 NOTE — Progress Notes (Signed)
Subjective:     Patient ID: Lucas Price, male   DOB: March 25, 1941, 76 y.o.   MRN: [id number]  HPI  CP Lucas Deutscher, DO  HPI    IOV 07/13/2017  Chief Complaint  Patient presents with  . Advice Only    Referred by Dr. [name] due to SOB. Denies any complaints of CP but states that he has complaints of mild chronic cough. Pt states that he wears O2 at home, DME: [employer] 46.84L   76 year old male. Immigrant from [place]. Moved to the [place] in 1969. 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 Moved to [facility] for years ago to be with his son who owns [employer] stores in [place]. 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 Starting some 4 years ago when he moved to [place] he started losing weight intentionally and is lost from 225 pounds 285 pounds currently. He tells me that approximately 20 years ago he was diagnosed with restrictive lung disease" and is been on inhaler therapy since then. He says the restriction is on account of his obesity. But at the same time he also tells me that he probably has interstitial lung disease. He is also on inhaler therapy which he says is not helping him. He is not sure if that his asthma or COPD although he is not smoking other than very limited smoking many years ago in his younger days. He's been on BiPAP with oxygen at night for the last few to several years through [facility] but he says his sleep study was negative for years ago. He does not have a pulmonologist here currently he has dyspnea on exertion and some cough that is mild to moderate in severity but overall stable. He is here to establish with pulmonologist. He does admit that sometimes when he walks he desaturates into the 60s. With our office he only walked half lap and he says he is impressed that he is able to walk that far  Walking desaturation test on an 85 feet 3 laps o 0 n room air with a full head  probe will and he walked with a cane he did he only walked half lap and got dyspneic and wanted to stop. His resting heart rate was 72 and he hit a peak heart rate of 111 and just have her lab. His resting pulse ox was 97% and he dropped to 91% in half lap when he stopped.  Review of the chart also shows he has mild anemia of chronic disease and chronic kidney disease    OV 07/29/2017  Chief Complaint  Patient presents with  . Follow-up    Pt had a HRCT, PFT, ABG all done 12/11 and is going to have a PET scan done 12/24.  States that there is no change in the SOB. Denies any cough or CP.   Lucas Price returns for follow-up.  He is here to discuss test results.  He had a CT scan high resolution July 22, 2017.  This does not show any interstitial lung disease in my personal visualization.  Agree with the findings of new lung nodules.  There is no air trapping or emphysema to suggest asthma/emphysema.  However his ABG showed some hypoxemia without hypercapnia.  He is on asthma inhalers as baseline and also BiPAP at night at baseline.  He wants to continue these.  The CT scan shows some nonspecific inflammation.  He has some new findings of coronary artery calcification.  He has a slight high IgE to suggest asthma.  Overall the findings and his previous therapy are not making sense  He is complaining of some eye discharge for a month.  He is asking for ciprofloxacin eyedrops.  I referred him to his primary care physician for this.    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   reports that  has never smoked. he has never used smokeless tobacco.  No past surgical history on file.  No Known Allergies  Immunization History  Administered Date(s) Administered  . Influenza, High Dose Seasonal PF 04/29/2017    No family history on file.   Current Outpatient Medications:  .  allopurinol (ZYLOPRIM) 100 MG tablet, Take 1 tablet (100 mg total) daily by mouth., Disp: 90 tablet, Rfl: 1 .  aspirin EC 81 MG tablet, Take 81 mg by mouth daily. , Disp: , Rfl:  .  budesonide-formoterol (SYMBICORT) 160-4.5 MCG/ACT inhaler, Inhale 2 puffs into the lungs 2 (two) times daily., Disp: , Rfl:  .  calcium carbonate (CALCIUM 600) 600 MG TABS tablet, Take 600 mg by mouth daily. , Disp: , Rfl:  .  Cholecalciferol (D 10000) 10000 units CAPS, Take by mouth 2 (two) times a week. , Disp: , Rfl:  .  Coenzyme Q-10 200 MG CAPS, Take 200 mg by mouth daily. , Disp: , Rfl:  .  Continuous Blood Gluc Sensor ([place]) MISC, Per instructions., Disp: 1 each, Rfl: 2 .  glimepiride (AMARYL) 4 MG tablet, Take 1 tablet (4 mg total) by mouth daily with breakfast., Disp: 90 tablet, Rfl: 1 .  guaiFENesin (MUCINEX) 600 MG 12 hr tablet, Take 1,200 mg by mouth 2 (two) times daily., Disp: , Rfl:  .  insulin glargine (LANTUS) 100 UNIT/ML injection, Inject 0.5 mLs (50 Units total) into the skin at bedtime., Disp: 5 vial, Rfl: 1 .  Insulin Syringe-Needle U-100 (RELION INSULIN SYRINGE 1ML/31G) 31G X 5/16" 1 ML MISC, 1 Syringe by Does not apply route 3 (three) times daily., Disp: 300 each, Rfl: 1 .  losartan (COZAAR) 50 MG tablet, Take 0.5-1 tablets (25-50 mg total) by mouth daily., Disp: 30 tablet, Rfl: 2 .  metoprolol (TOPROL-XL) 200 MG 24 hr tablet, Take 200 mg by mouth daily. , Disp: , Rfl:  .  sitaGLIPtin (JANUVIA) 100 MG tablet, Take 100mg  daily, Disp: , Rfl:  .  Syringe, Disposable, 1 ML MISC, Use with insulin needle., Disp: 100 each, Rfl: 3 .  Tiotropium Bromide  Monohydrate (SPIRIVA RESPIMAT) 2.5 MCG/ACT AERS, Inhale 2 puffs into the lungs daily., Disp: , Rfl:  .  torsemide (DEMADEX) 100 MG tablet, Take 0.5 tablets (50 mg total) by mouth daily., Disp: 90 tablet, Rfl: 1 .  Travoprost, BAK Free, (TRAVATAN Z) 0.004 % SOLN ophthalmic solution, Use as directed, Disp: , Rfl:  .  triamcinolone cream (KENALOG) 0.1 %, Use as directed as needed, Disp: , Rfl:  .  Continuous Blood Gluc Receiver (FREESTYLE LIBRE READER) DEVI, 1 Device by Does not apply route once., Disp: 1 Device, Rfl: 0    Review of Systems     Objective:   Physical Exam  Vitals:   07/29/17 1613 07/29/17 1616  BP: (!) 108/58   Pulse: 67   SpO2: (!) 89% 93%  Weight: 185 lb 9.6 oz (84.2 kg)   Height: 5\' 3"  (1.6 m)     Estimated body mass index is 32.88 kg/m as calculated from the following:   Height as of this encounter: 5\' 3"  (1.6 m).   Weight as of this encounter: 185 lb 9.6 oz (84.2 kg).  Discussion onlu vsiti     Assessment:        ICD-10-CM   1. Multiple lung nodules on CT R91.8   2. History of asthma Z87.09   3. Chronic respiratory failure with hypoxia ([facility]) J96.11   4. Coronary artery calcification I25.10    I25.84   5. Eye discharge H57.89      Plan:     Multiple lung nodules on CT  - PET scan 08/03/17; will call with result after  New year - [place] to get CD rom of Curent CT in super D protocol and leave on my compuer   History of asthma - continue scheduled inhaler as before  Chronic respiratory failure with hypoxia ([place]) - continue o2 at night with bipap as before  Coronary artery calcification -  does have coronary artery calcificatio - please refer to cardiologist -to DR Dyann Kief [facility]  Eye Discharge  - per PCP Lucas Deutscher, DO   Followup - next few weeks but after PET to discuss results w 0- return to see DR Chase Caller or DR Lamonte Sakai or APP [facility]  > 50% of this > 25 min visit spent in face to face counseling or coordination of  care    Dr. Brand Males, M.D., [facility].C.P Pulmonary and Critical Care Medicine Staff Physician, [place] Director - Interstitial Lung Disease  Program  Pulmonary [place] at [place], [place], [address]  Pager: [phone], If no answer or between  15:00h - 7:00h: call [phone] Telephone: [phone]

## 2017-08-03 ENCOUNTER — Ambulatory Visit (HOSPITAL_COMMUNITY)
Admission: RE | Admit: 2017-08-03 | Discharge: 2017-08-03 | Disposition: A | Discharge status: Home / Self Care | Payer: Medicare Other | Source: Ambulatory Visit | Attending: Internal Medicine | Admitting: Internal Medicine

## 2017-08-03 DIAGNOSIS — I2510 Atherosclerotic heart disease of native coronary artery without angina pectoris: Secondary | ICD-10-CM | POA: Diagnosis not present

## 2017-08-03 DIAGNOSIS — M5136 Other intervertebral disc degeneration, lumbar region: Secondary | ICD-10-CM | POA: Insufficient documentation

## 2017-08-03 DIAGNOSIS — I700 Atherosclerosis of aorta: Secondary | ICD-10-CM | POA: Diagnosis not present

## 2017-08-03 DIAGNOSIS — M47816 Spondylosis without myelopathy or radiculopathy, lumbar region: Secondary | ICD-10-CM | POA: Diagnosis not present

## 2017-08-03 DIAGNOSIS — R918 Other nonspecific abnormal finding of lung field: Secondary | ICD-10-CM | POA: Diagnosis not present

## 2017-08-03 DIAGNOSIS — N200 Calculus of kidney: Secondary | ICD-10-CM | POA: Insufficient documentation

## 2017-08-03 DIAGNOSIS — K8020 Calculus of gallbladder without cholecystitis without obstruction: Secondary | ICD-10-CM | POA: Insufficient documentation

## 2017-08-03 DIAGNOSIS — R933 Abnormal findings on diagnostic imaging of other parts of digestive tract: Secondary | ICD-10-CM | POA: Diagnosis not present

## 2017-08-03 DIAGNOSIS — R911 Solitary pulmonary nodule: Secondary | ICD-10-CM | POA: Diagnosis not present

## 2017-08-03 LAB — GLUCOSE, CAPILLARY: Glucose-Capillary: 65 mg/dL (ref 65–99)

## 2017-08-03 MED ORDER — FLUDEOXYGLUCOSE F - 18 (FDG) INJECTION
9.1500 | Freq: Once | INTRAVENOUS | Status: AC | PRN
  Administered 2017-08-03: 9.15 via INTRAVENOUS

## 2017-08-12 ENCOUNTER — Telehealth: Payer: Self-pay | Admitting: Internal Medicine

## 2017-08-12 DIAGNOSIS — K6389 Other specified diseases of intestine: Secondary | ICD-10-CM

## 2017-08-12 DIAGNOSIS — R911 Solitary pulmonary nodule: Secondary | ICD-10-CM

## 2017-08-12 NOTE — Telephone Encounter (Signed)
Raquel Sarna (cc Ria Comment) multiple PET abnormalities on my office patient with abnormal PET scan . -   Did you get the Super D as instructed in prior OV? If note, please do and give to Desmond Dike - refer GI for abnormal  PET scan   - refer to [facility] - needs SUPER D from recent CT for eval for ENB - cancel appt  With Eric Form - just make one directly with [facility]  - please tell me when [facility] is seeing patient; can he in next  Few weeks  Cam Mosely (research) - -patient has agreed to participate in nodule study. Please screen for I/E. Will send email as well   Thanks  Dr. Brand Males, M.D., [facility].C.P Pulmonary and Critical Care Medicine Staff Physician, [place] Director - Interstitial Lung Disease  Program  Pulmonary [place] at [place], [place], [address]  Pager: [phone], If no answer or between  15:00h - 7:00h: call [phone] Telephone: [phone]

## 2017-08-13 NOTE — Telephone Encounter (Signed)
Tried to call pt to let him know the information stated by MR about pt's PET scan and that we needed him to have a Super D CT scan and also a referral to GI based off of the PET scan, but pt did not answer.  Went ahead and cancelled pt's appt that was scheduled with SG on 08/19/17 due to pt needing to see Dr. Lamonte Sakai instead.  Will await for pt to call back so we can schedule his appt with Dr. Lamonte Sakai and so we can place the order for him to have a Super D CT scan and referral to GI.

## 2017-08-13 NOTE — Telephone Encounter (Signed)
[facility] fine. Can see Dr Silverio Decamp  Dr. Brand Males, M.D., [facility].C.P Pulmonary and Critical Care Medicine Staff Physician, [place] Director - Interstitial Lung Disease  Program  Pulmonary [place] at [place], [place], [address]  Pager: [phone], If no answer or between  15:00h - 7:00h: call [phone] Telephone: [phone]

## 2017-08-13 NOTE — Telephone Encounter (Signed)
MR had wanted pt to have the referral to GI due to having the abnormal PET scan.   Called pt to tell him this but unable to reach pt and left message for him to call us back x1.

## 2017-08-13 NOTE — Telephone Encounter (Signed)
Pt is aware of recommended GI physician and voiced his understanding.  MR please clarify what dx you would like to be used for referral. Thanks.

## 2017-08-13 NOTE — Telephone Encounter (Signed)
Referral placed with diagnosis. Nothing further is needed.

## 2017-08-13 NOTE — Telephone Encounter (Signed)
Patient returning call - he can be reached at [phone] -pr

## 2017-08-13 NOTE — Telephone Encounter (Signed)
Dx is suspected colonic mass and for colon cancer: Below is PET scan results   Dr. Brand Males, M.D., [facility].C.P Pulmonary and Critical Care Medicine Staff Physician, [place] Director - Interstitial Lung Disease  Program  Pulmonary [place] at [place], [place], [address]  Pager: [phone], If no answer or between  15:00h - 7:00h: call [phone] Telephone: [phone]      Accentuated metabolic activity in the right colon, maximum SUV 8.1, questionable wall thickening in this vicinity. This could be due to peristalsis or a colon mass. Correlation with the patient's colon cancer screening history is recommended. If screening is not up-to-date, appropriate screening should be considered.

## 2017-08-13 NOTE — Telephone Encounter (Signed)
Patient returned Dr. Golden Pop call, CB [phone].

## 2017-08-13 NOTE — Telephone Encounter (Signed)
Spoke with pt and advised message from MR. Pt understood and agreed.   Lucas Price I do not know if he is just needed a colonoscopy or GI evaluation. I pended the orders but I want to make sure this is done correctly. Please clarify.   Please call pt to schedule appt with RB after Super D is scheduled.

## 2017-08-13 NOTE — Telephone Encounter (Signed)
Pt is aware of below message and voiced his understanding.  Pt would like to know which GI MR recommends.  GI referral is pending until response is received by MR.   MR please advise. Thanks.

## 2017-08-14 ENCOUNTER — Telehealth: Payer: Self-pay | Admitting: Gastroenterology

## 2017-08-14 NOTE — Telephone Encounter (Signed)
Please see Epic referral. Patient is being referred for colonic mass. Dr. Loletha Carrow is Doc of the Day for 08/13/17 PM. Please advise on scheduling. Thanks.

## 2017-08-14 NOTE — Telephone Encounter (Signed)
Looks like he will need a colonoscopy done in hospital outpatient endo lab due to pulmonary condition. Please have him see one of the APPs in the next 2-3 weeks and ask the patient to hold February 12th aside because I believe that is my next available hospital endoscopy block.  - HD

## 2017-08-14 NOTE — Telephone Encounter (Signed)
Please contact patient to come in and see one of the APP's. I will schedule his hospital case, but please make patient aware to hold the date of 09/22/17 and APP can discuss this further with him at that appointment.

## 2017-08-14 NOTE — Telephone Encounter (Signed)
Please see PET scan and advise on scheduling, thanks.

## 2017-08-14 NOTE — Telephone Encounter (Signed)
Appointment scheduled with Lucas Ba, PA-C for 08/25/17. Patient is aware of 09/22/17 hospital procedure. He states that it is fine to see Dr. Loletha Carrow and is hoping that he could have a sooner hospital procedure appointment.

## 2017-08-18 ENCOUNTER — Telehealth: Payer: Self-pay | Admitting: Internal Medicine

## 2017-08-18 ENCOUNTER — Other Ambulatory Visit: Payer: Self-pay

## 2017-08-18 DIAGNOSIS — K6389 Other specified diseases of intestine: Secondary | ICD-10-CM

## 2017-08-18 NOTE — Telephone Encounter (Signed)
MR please clarify which physician you would like pt to reach out to regarding questions.   Apt with RB has been made her 09/08/17, as this was first available apt . Pt request sooner apt, as he is anxious to have ENB done.   MR please advise. Thanks.

## 2017-08-18 NOTE — Telephone Encounter (Signed)
Spoke with Lucas Price, he states he wants to go ahead and get the biopsy done. He seems really anxious about this. It looks like he has been referred to a GI doctor so I was trying to explain this to him. A language barrier was present. Please advise MR.

## 2017-08-18 NOTE — Telephone Encounter (Signed)
If you go back on phone tree there are 2 issues  1. GI appt - I see this is made  2. Appt with RB for eval for ENB - this office visit with RB has not been made. Please do this.   3. Regarding questions to me: you can call Dr Doyne Keel and ask the questions. Please tell him I have a busy week and would be easier if I got the questions through you. I can certatinly call him but would be later in the week or early next week   Dr. Brand Males, M.D., [facility].C.P Pulmonary and Critical Care Medicine Staff Physician, [place] Director - Interstitial Lung Disease  Program  Pulmonary [place] at [place], [place], [address]  Pager: [phone], If no answer or between  15:00h - 7:00h: call [phone] Telephone: [phone]

## 2017-08-18 NOTE — Telephone Encounter (Signed)
Spoke with pt, advised message from MR. He states he still would like a phone call from MR. I advised him this may be done next week, pt understood.

## 2017-08-18 NOTE — Telephone Encounter (Signed)
Scheduled colonoscopy, prep instructions printed and given to Pam to give to patient at his visit on 08/25/17 with Amy E.

## 2017-08-18 NOTE — Telephone Encounter (Signed)
Please let DR Doran Heater know that DR Lamonte Sakai will see him 09/08/17 and then discuss biopsy. THere is nothing urgent to rush his biopsies. So, I feel conformtable with his 09/08/17 visit with RB. I can handle questions as his primary pulmonologists

## 2017-08-19 ENCOUNTER — Ambulatory Visit: Payer: PRIVATE HEALTH INSURANCE | Admitting: Acute Care

## 2017-08-21 ENCOUNTER — Encounter: Payer: Self-pay | Admitting: Family Medicine

## 2017-08-21 ENCOUNTER — Ambulatory Visit (INDEPENDENT_AMBULATORY_CARE_PROVIDER_SITE_OTHER): Payer: Medicare Other | Admitting: Family Medicine

## 2017-08-21 ENCOUNTER — Other Ambulatory Visit: Payer: Self-pay

## 2017-08-21 ENCOUNTER — Telehealth: Payer: Self-pay | Admitting: Family Medicine

## 2017-08-21 VITALS — BP 108/62 | HR 82 | Temp 97.5°F | Ht 63.0 in | Wt 180.4 lb

## 2017-08-21 DIAGNOSIS — E1122 Type 2 diabetes mellitus with diabetic chronic kidney disease: Secondary | ICD-10-CM

## 2017-08-21 DIAGNOSIS — Z794 Long term (current) use of insulin: Principal | ICD-10-CM

## 2017-08-21 DIAGNOSIS — H109 Unspecified conjunctivitis: Secondary | ICD-10-CM

## 2017-08-21 DIAGNOSIS — H538 Other visual disturbances: Secondary | ICD-10-CM | POA: Diagnosis not present

## 2017-08-21 DIAGNOSIS — N183 Chronic kidney disease, stage 3 unspecified: Secondary | ICD-10-CM

## 2017-08-21 MED ORDER — ERYTHROMYCIN 5 MG/GM OP OINT: TOPICAL_OINTMENT | OPHTHALMIC | 0 refills | Status: DC

## 2017-08-21 MED ORDER — TIOTROPIUM BROMIDE MONOHYDRATE 2.5 MCG/ACT IN AERS: 2.0000 | INHALATION_SPRAY | Freq: Every day | RESPIRATORY_TRACT | 3 refills | Status: DC

## 2017-08-21 MED ORDER — FREESTYLE LIBRE READER DEVI: 1.0000 | Freq: Once | 0 refills | Status: DC

## 2017-08-21 MED ORDER — FREESTYLE LIBRE SENSOR SYSTEM MISC: 2 refills | Status: DC

## 2017-08-21 MED ORDER — POLYMYXIN B-TRIMETHOPRIM 10000-0.1 UNIT/ML-% OP SOLN: 1.0000 [drp] | Freq: Four times a day (QID) | OPHTHALMIC | 0 refills | Status: DC

## 2017-08-21 NOTE — Telephone Encounter (Signed)
Copied from [place] [id number]. Topic: Quick Communication - See Telephone Encounter >> Aug 21, 2017  5:53 PM Oliver Pila B wrote: CRM for notification. See Telephone encounter for: medication is on hold by manufacturer  erythromycin ophthalmic ointment [[id number]]   08/21/17.

## 2017-08-21 NOTE — Progress Notes (Signed)
Subjective:  Lucas Price is a 77 y.o. year old very pleasant male patient who presents for/with See problem oriented charting ROS- slight blurry vision- no progression may be from discharge, continued discharge from eyes over last month, red conjunctiva during this time. No fever or chills.    Past Medical History-  Patient Active Problem List   Diagnosis Date Noted  . 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 Renal calculus 03/19/2015  . Anemia in chronic kidney disease (CKD) 05/26/2014  . CKD (chronic kidney disease) stage 3, GFR 30-59 ml/min (HCC) 05/26/2014  . COPD (chronic obstructive pulmonary disease) ([place]) 05/26/2014  . 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 Former smoker 05/26/2014  . GERD (gastroesophageal reflux disease) 05/26/2014  . 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 Bladder neck obstruction 04/24/2014  . Recurrent urinary tract infection 09/05/2013  . Retention of urine 09/05/2013    Medications- reviewed and updated Current Outpatient Medications  Medication Sig Dispense Refill  . allopurinol (ZYLOPRIM) 100 MG tablet Take 1 tablet (100 mg total) daily by mouth. 90 tablet 1  . aspirin EC 81 MG tablet Take 81 mg by mouth daily.     . budesonide-formoterol (SYMBICORT) 160-4.5 MCG/ACT inhaler Inhale 2 puffs into the lungs 2 (two) times daily.    . calcium carbonate (CALCIUM 600) 600 MG TABS tablet Take 600 mg by mouth daily.     . Cholecalciferol (D 10000) 10000 units CAPS Take by mouth 2 (two) times a week.     . Coenzyme Q-10 200 MG CAPS Take 200 mg by mouth daily.     . Continuous Blood Gluc Sensor ([place]) MISC Per instructions. 1 each 2  . glimepiride (AMARYL) 4 MG tablet Take 1 tablet (4 mg total) by mouth daily with breakfast. 90 tablet 1  . guaiFENesin (MUCINEX) 600 MG 12 hr tablet Take 1,200  mg by mouth 2 (two) times daily.    . insulin glargine (LANTUS) 100 UNIT/ML injection Inject 0.5 mLs (50 Units total) into the skin at bedtime. 5 vial 1  . Insulin Syringe-Needle U-100 (RELION INSULIN SYRINGE 1ML/31G) 31G X 5/16" 1 ML MISC 1 Syringe by Does not apply route 3 (three) times daily. 300 each 1  . losartan (COZAAR) 50 MG tablet Take 0.5-1 tablets (25-50 mg total) by mouth daily. 30 tablet 2  . metoprolol (TOPROL-XL) 200 MG 24 hr tablet Take 50 mg by mouth daily.     . sitaGLIPtin (JANUVIA) 100 MG tablet Take 50mg  daily    . Syringe, Disposable, 1 ML MISC Use with insulin needle. 100 each 3  . Tiotropium Bromide Monohydrate (SPIRIVA RESPIMAT) 2.5 MCG/ACT AERS Inhale 2 puffs into the lungs daily.    [name] torsemide (DEMADEX) 100 MG tablet Take 0.5 tablets (50 mg total) by mouth daily. 90 tablet 1  . Travoprost, BAK Free, (TRAVATAN Z) 0.004 % SOLN ophthalmic solution Use as directed    . triamcinolone cream (KENALOG) 0.1 % Use as directed as needed    . Continuous Blood Gluc Receiver (FREESTYLE LIBRE READER) DEVI 1 Device by Does not apply route once. 1 Device 0   Objective: BP 108/62 (BP Location: Left Arm, Patient Position: Sitting, Cuff Size: Large)   Pulse 82   Temp (!) 97.5 F (36.4 C) (Oral)   Ht 5\' 3"  (1.6 m)   Wt 180 lb 6.4 oz (81.8  kg)   SpO2 94%   BMI 31.96 kg/m  Gen: NAD, resting comfortably Bilateral eyes with significant conjunctival erythema- upper and lower lids. No obvious discharge at present. oropharynx largely normal CV: RRR no murmurs rubs or gallops Lungs: CTAB no crackles, wheeze, rhonchi obese Skin: warm, dry, see markings from oxygen mask on right face- not wearing today   Visual Acuity Screening   Right eye Left eye Both eyes  Without correction:     With correction: 20/20 20/50 20/25     Assessment/Plan:  Bacterial conjunctivitis S: Patient states he has had 30 days of eye discharge and conjunctival erythema (retired Engineer, drilling). He has a history of  Glaucoma, DM, HTN, COPD, HTN. He does think he has slight blurry vision- but may be with the discharge. Sees  Dr. Zadie Rhine but last visit 1 year ago. No eye pain. He has tried Zyrtec for sinuses, antibiotic eye drop-10 days gentamicin. Wears glasses, No contacts.   States baseline right eye vision is 20/40 and slightly worse on left eye even with glasses. With glasses exam today as above shows likely normal results A/P: I am very reassured by vision exam being close to baseline. At beginning of visit had strongly considered optho referral for early next week- I think it is worth trying a alternate conjunctivitis treatment and if not improving- have him see optho later next week or week after that- should seek care immediately if worsening vision.    I typically do not use gentamicin for bacterial conjunctivitis- we discussed trial of polytrim eye drops or erythromycin ointment- I prefer ointment as did patient and we sent this in but unfortunately on back order- we called in polytrim as alternate.   Patient Instructions  Bacterial conjunctivitis given continued purulent discharge. We will try macrolide antibiotic (erythromycin) since no effect with the gentamicin. If not improved by next week please see [place] back- or contact us immediately if vision worsens.   Future Appointments  Date Time Provider [place]  08/25/2017 11:00 AM Leotis Pain [facility]  09/08/2017  2:30 PM Byrum, Rose Fillers, MD [facility] None   Meds ordered this encounter  Medications  . erythromycin ophthalmic ointment    Sig: One-half inch (1.25 cm) four times daily for 5 to 7 days to affected eye(s)    Dispense:  3.5 g    Refill:  0  . trimethoprim-polymyxin b (POLYTRIM) ophthalmic solution    Sig: Place 1 drop into both eyes 4 (four) times daily. For 7 days to affected eye(s)    Dispense:  10 mL    Refill:  0   Return precautions advised.  Garret Reddish, MD

## 2017-08-21 NOTE — Patient Instructions (Addendum)
Bacterial conjunctivitis given continued purulent discharge. We will try macrolide antibiotic (erythromycin) since no effect with the gentamicin. If not improved by next week please see [place] back- or contact us immediately if vision worsens.

## 2017-08-21 NOTE — Telephone Encounter (Signed)
On call provider, Dr. Jerline Pain contacted due to medication being on back order with the pharmacy. Dr. Jerline Pain states that Dr. Yong Channel was sitting beside him and will call in another medication.

## 2017-08-25 ENCOUNTER — Encounter: Payer: Self-pay | Admitting: Physician Assistant

## 2017-08-25 ENCOUNTER — Ambulatory Visit (INDEPENDENT_AMBULATORY_CARE_PROVIDER_SITE_OTHER): Payer: Medicare Other | Admitting: Physician Assistant

## 2017-08-25 VITALS — BP 100/58 | HR 62 | Ht 63.0 in | Wt 183.2 lb

## 2017-08-25 DIAGNOSIS — K6389 Other specified diseases of intestine: Secondary | ICD-10-CM

## 2017-08-25 DIAGNOSIS — R9389 Abnormal findings on diagnostic imaging of other specified body structures: Secondary | ICD-10-CM | POA: Diagnosis not present

## 2017-08-25 DIAGNOSIS — K639 Disease of intestine, unspecified: Secondary | ICD-10-CM

## 2017-08-25 NOTE — Progress Notes (Signed)
Thank you for sending this case to me and for discussing it in clinic today. I originally fielded the call as DOD.  I have reviewed the entire note, and the outlined plan is what we discussed.  Colonoscopy at [facility] due to pulmonary condition.  Wilfrid Lund, MD

## 2017-08-25 NOTE — Progress Notes (Signed)
Subjective:    Patient ID: Lucas Price, male    DOB: Mar 09, 1941, 77 y.o.   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  He is referred today by Dr. Chase Caller for colonoscopy.  Patient had undergone CT of the chest in December 2018 with finding of 2 large pulmonary nodules in the right middle lobe suspicious for primary bronchogenic CA, he also had mild increase size of a right paratracheal node.  He underwent subsequent PET scan on 08/03/2017 did show small gallstones, this also showed increased metabolic activity and potential mild wall thickening of the ascending colon.  Could not rule out mass versus peristalsis.  The central left lower lobe pulmonary nodule measured 2.3 x 1.5 cm compatible with malignancy, and there was dominant nodularity inferiorly in the right middle lobe measuring 2.1 x 1.6 cm with low-grade metabolic activity rule out malignant versus inflammatory.  There is a 1.3 cm right lower paratracheal lymph node. Patient is scheduled to see Dr. Lamonte Sakai on 09/08/2017 to discuss lung biopsy.  Patient has known COPD and is oxygen dependent with 1.5 L chronically though he says sometimes when he goes somewhere he will leave the oxygen at home.  He also has history of morbid obesity, was born with prune belly syndrome.,  He has chronic GERD, insulin-dependent diabetes mellitus, hypertension, and chronic kidney disease. Portal was initiated because of 66-month history of weight loss of about 25 pounds.  He has had a decrease in appetite as well.  He denies any issues with abdominal pain, no nausea or vomiting, no heartburn or indigestion no dysphagia or odynophagia.  He does have some problems with constipation and uses MiraLAX.  He has not noted any changes in his bowel habits and no melena or hematochezia. He has chronic problems with shortness of breath and as per pulmonary notes will desat fairly  easily with short ambulation.  Patient has not had any prior colonoscopy. Labs were reviewed from 07/14/2017, hemoglobin 13.2, hematocrit of 40.9, BUN 58 creatinine 2.1  Review of Systems Pertinent positive and negative review of systems were noted in the above HPI section.  All other review of systems was otherwise negative.  Outpatient Encounter Medications as of 08/25/2017  Medication Sig  . allopurinol (ZYLOPRIM) 100 MG tablet Take 1 tablet (100 mg total) daily by mouth.  [name] aspirin EC 81 MG tablet Take 81 mg by mouth daily.   . budesonide-formoterol (SYMBICORT) 160-4.5 MCG/ACT inhaler Inhale 2 puffs into the lungs 2 (two) times daily.  . calcium carbonate (CALCIUM 600) 600 MG TABS tablet Take 600 mg by mouth daily.   . Cholecalciferol (D 10000) 10000 units CAPS Take by mouth 2 (two) times a week.   . Coenzyme Q-10 200 MG CAPS Take 200 mg by mouth daily.   . Continuous Blood Gluc Sensor ([place]) MISC Per instructions.  [name] glimepiride (AMARYL) 4 MG tablet Take 1 tablet (4 mg total) by mouth daily with breakfast.  . guaiFENesin (MUCINEX) 600 MG 12 hr tablet Take 1,200 mg by mouth 2 (two) times daily.  . insulin glargine (LANTUS) 100 UNIT/ML injection Inject 0.5 mLs (50 Units total) into the skin at bedtime.  . Insulin Syringe-Needle U-100 (RELION INSULIN SYRINGE 1ML/31G) 31G X 5/16" 1 ML MISC 1 Syringe by Does not apply route 3 (three) times daily.  [name] losartan (COZAAR) 50 MG tablet Take 0.5-1 tablets (25-50 mg total)  by mouth daily.  . metoprolol (TOPROL-XL) 200 MG 24 hr tablet Take 50 mg by mouth daily.   . OXYGEN Inhale 1.5 L into the lungs.  . sitaGLIPtin (JANUVIA) 100 MG tablet Take 50mg  daily  . Syringe, Disposable, 1 ML MISC Use with insulin needle.  . Tiotropium Bromide Monohydrate (SPIRIVA RESPIMAT) 2.5 MCG/ACT AERS Inhale 2 puffs into the lungs daily.  [name] torsemide (DEMADEX) 100 MG tablet Take 0.5 tablets (50 mg total) by mouth daily.  . Travoprost, BAK Free,  (TRAVATAN Z) 0.004 % SOLN ophthalmic solution Use as directed  . triamcinolone cream (KENALOG) 0.1 % Use as directed as needed  . trimethoprim-polymyxin b (POLYTRIM) ophthalmic solution Place 1 drop into both eyes 4 (four) times daily. For 7 days to affected eye(s)  . [DISCONTINUED] erythromycin ophthalmic ointment One-half inch (1.25 cm) four times daily for 5 to 7 days to affected eye(s)  . Continuous Blood Gluc Receiver (FREESTYLE LIBRE READER) DEVI 1 Device by Does not apply route once for 1 dose.   No facility-administered encounter medications on file as of 08/25/2017.    No Known Allergies Patient Active Problem List   Diagnosis Date Noted  . 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 Renal calculus 03/19/2015  . Anemia in chronic kidney disease (CKD) 05/26/2014  . CKD (chronic kidney disease) stage 3, GFR 30-59 ml/min (HCC) 05/26/2014  . COPD (chronic obstructive pulmonary disease) ([place]) 05/26/2014  . 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 Former smoker 05/26/2014  . GERD (gastroesophageal reflux disease) 05/26/2014  . 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 Bladder neck obstruction 04/24/2014  . Recurrent urinary tract infection 09/05/2013  . Retention of urine 09/05/2013   Social History   Socioeconomic History  . Marital status: Married    Spouse name: Not on file  . Number of children: Not on file  . Years of education: Not on file  . Highest education level: Not on file  Social Needs  . Financial resource strain: Not on file  . Food insecurity - worry: Not on file  . Food insecurity - inability: Not on file  . Transportation needs - medical: Not on file  . Transportation needs - non-medical: Not on file  Occupational History  . Not on file  Tobacco Use  . Smoking status: Never Smoker  . Smokeless tobacco: Never Used    Substance and Sexual Activity  . Alcohol use: No  . Drug use: No  . Sexual activity: Yes    Partners: Female  Other Topics Concern  . Not on file  Social History Narrative  . Not on file    Mr. Caniglia family history includes Diabetes in his mother; Heart disease in his mother.      Objective:    Vitals:   08/25/17 1058  BP: (!) 100/58  Pulse: 62    Physical Exam well-developed older [place] male in no acute distress, accompanied by his son.  Blood pressure 100/58, pulse 62, height 5 foot 3, weight 183, BMI 32.4.  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  PET scan did show a  right middle lobe lesion consistent with malignancy. PET scan also showed increased metabolic activity in the a sending colon with potential minimal wall thickening, rule out mass versus peristalsis. Patient has no specific GI complaints, no prior colon evaluation.  #2 weight loss and decrease in appetite likely secondary to above 3.  COPD oxygen dependent 4.  Morbid obesity 5.  Adult onset diabetes mellitus insulin-dependent 6.  Hypertension 7.  Chronic kidney disease  Plan; Patient will be scheduled for colonoscopy with Dr. Loletha Carrow at [facility] on February 12.  Procedure will be done at the hospital due to COPD with oxygen requirement.  Procedure was discussed in detail with patient including  indications risks and benefits and he is agreeable to proceed.   Amy S Esterwood PA-C 08/25/2017   Cc: Briscoe Deutscher, DO

## 2017-08-25 NOTE — Patient Instructions (Signed)
If you are age 75 or older, your body mass index should be between 23-30. Your Body mass index is 32.46 kg/m. If this is out of the aforementioned range listed, please consider follow up with your Primary Care Provider.  If you are age 4 or younger, your body mass index should be between 19-25. Your Body mass index is 32.46 kg/m. If this is out of the aformentioned range listed, please consider follow up with your Primary Care Provider.   You have been scheduled for a colonoscopy. Please follow written instructions given to you at your visit today.  Please pick up your prep supplies at the pharmacy within the next 1-3 days. If you use inhalers (even only as needed), please bring them with you on the day of your procedure. Your physician has requested that you go to www.startemmi.com and enter the access code given to you at your visit today. This web site gives a general overview about your procedure. However, you should still follow specific instructions given to you by our office regarding your preparation for the procedure.   Take your Laxatives daily until the day of your prep for the colonoscopy.

## 2017-09-01 DIAGNOSIS — R0602 Shortness of breath: Secondary | ICD-10-CM | POA: Diagnosis not present

## 2017-09-01 DIAGNOSIS — I10 Essential (primary) hypertension: Secondary | ICD-10-CM | POA: Diagnosis not present

## 2017-09-01 DIAGNOSIS — J449 Chronic obstructive pulmonary disease, unspecified: Secondary | ICD-10-CM | POA: Diagnosis not present

## 2017-09-01 DIAGNOSIS — I2510 Atherosclerotic heart disease of native coronary artery without angina pectoris: Secondary | ICD-10-CM | POA: Diagnosis not present

## 2017-09-02 ENCOUNTER — Telehealth: Payer: Self-pay | Admitting: Family Medicine

## 2017-09-02 NOTE — Telephone Encounter (Signed)
Copied from [place]. Topic: Quick Communication - See Telephone Encounter >> Sep 02, 2017  5:09 PM Ivar Drape wrote: CRM for notification. See Telephone encounter for:  09/02/17. Myriam Jacobson w/[facility] [phone] mail order pharmacy wants a clarification on the FREESTYLE LIBRE READER) and the Continuous Blood Gluc Sensor ([place].  She needs to know if the PCP wants a 10 day or a 14 day supply of these medications. Myriam Jacobson said you can call or send in a new prescription, whichever is easiest for you.  Dr. Rushie Chestnut wrote the prescriptions.

## 2017-09-03 NOTE — Telephone Encounter (Signed)
Pharmacy stated that the patients insurance will not cover the machine or supplies. It is almost $ 800 for the supplies. Notified patient of this and he does not want to get supplies at this time.

## 2017-09-03 NOTE — Telephone Encounter (Signed)
See note

## 2017-09-03 NOTE — Telephone Encounter (Signed)
Spoke with pharmacy and the clarification was given. Patient is checking 2-4 times a day. They will send out sufficient amount for 90 day supply.

## 2017-09-07 ENCOUNTER — Encounter (HOSPITAL_COMMUNITY): Payer: Self-pay | Admitting: Emergency Medicine

## 2017-09-07 ENCOUNTER — Other Ambulatory Visit: Payer: Self-pay

## 2017-09-08 ENCOUNTER — Encounter: Payer: Self-pay | Admitting: Emergency Medicine

## 2017-09-08 ENCOUNTER — Other Ambulatory Visit (INDEPENDENT_AMBULATORY_CARE_PROVIDER_SITE_OTHER): Payer: Medicare Other

## 2017-09-08 ENCOUNTER — Ambulatory Visit (INDEPENDENT_AMBULATORY_CARE_PROVIDER_SITE_OTHER): Payer: Medicare Other | Admitting: Emergency Medicine

## 2017-09-08 VITALS — BP 134/60 | HR 82 | Ht 63.0 in | Wt 180.0 lb

## 2017-09-08 DIAGNOSIS — R918 Other nonspecific abnormal finding of lung field: Secondary | ICD-10-CM | POA: Diagnosis not present

## 2017-09-08 DIAGNOSIS — R911 Solitary pulmonary nodule: Secondary | ICD-10-CM

## 2017-09-08 LAB — COMPREHENSIVE METABOLIC PANEL
ALK PHOS: 76 U/L (ref 39–117)
ALT: 21 U/L (ref 0–53)
AST: 16 U/L (ref 0–37)
Albumin: 3.7 g/dL (ref 3.5–5.2)
BILIRUBIN TOTAL: 0.4 mg/dL (ref 0.2–1.2)
BUN: 50 mg/dL — AB (ref 6–23)
CO2: 32 mEq/L (ref 19–32)
Calcium: 9.2 mg/dL (ref 8.4–10.5)
Chloride: 103 mEq/L (ref 96–112)
Creatinine, Ser: 2.08 mg/dL — ABNORMAL HIGH (ref 0.40–1.50)
GFR: 33.25 mL/min — ABNORMAL LOW (ref >60.00)
GLUCOSE: 93 mg/dL (ref 70–99)
POTASSIUM: 5 meq/L (ref 3.5–5.1)
SODIUM: 142 meq/L (ref 135–145)
TOTAL PROTEIN: 7.5 g/dL (ref 6.0–8.3)

## 2017-09-08 LAB — CBC WITH DIFFERENTIAL/PLATELET
Basophils Absolute: 0 10*3/uL (ref 0.0–0.1)
Basophils Relative: 0.4 % (ref 0.0–3.0)
EOS PCT: 1.2 % (ref 0.0–5.0)
Eosinophils Absolute: 0.1 10*3/uL (ref 0.0–0.7)
HCT: 41.5 % (ref 39.0–52.0)
Hemoglobin: 13.8 g/dL (ref 13.0–17.0)
LYMPHS ABS: 1.9 10*3/uL (ref 0.7–4.0)
Lymphocytes Relative: 17.5 % (ref 12.0–46.0)
MCHC: 33.3 g/dL (ref 30.0–36.0)
MCV: 94.8 fl (ref 78.0–100.0)
MONO ABS: 0.7 10*3/uL (ref 0.1–1.0)
Monocytes Relative: 6.1 % (ref 3.0–12.0)
NEUTROS PCT: 74.8 % (ref 43.0–77.0)
Neutro Abs: 8.1 10*3/uL — ABNORMAL HIGH (ref 1.4–7.7)
PLATELETS: 174 10*3/uL (ref 150.0–400.0)
RBC: 4.38 Mil/uL (ref 4.22–5.81)
RDW: 15.1 % (ref 11.5–15.5)
WBC: 10.9 10*3/uL — ABNORMAL HIGH (ref 4.0–10.5)

## 2017-09-08 MED ORDER — LEVOFLOXACIN 500 MG PO TABS: 500.0000 mg | ORAL_TABLET | Freq: Every day | ORAL | 0 refills | Status: DC

## 2017-09-08 NOTE — Addendum Note (Signed)
Addended by: Desmond Dike C on: 09/08/2017 03:58 PM   Modules accepted: Orders

## 2017-09-08 NOTE — Addendum Note (Signed)
Addended by: Desmond Dike C on: 09/08/2017 03:59 PM   Modules accepted: Orders

## 2017-09-08 NOTE — Assessment & Plan Note (Signed)
In the left hilar region (left lower lobe) and the right middle lobe.  The left hilar nodule is hypermetabolic by PET scan, concerning for possible malignancy.  I believe it is reasonable to try treating him empirically for a possible rounded pneumonia to see if the lesions resolve.  If so then we will defer biopsy.  I will repeat a CT scan of the chest, super D cuts, after the Levaquin.  If the nodules persist then I believe he needs navigational bronchoscopy.  Also we will perform TB testing since he did have a TB exposure through a family member in the past.

## 2017-09-08 NOTE — Patient Instructions (Addendum)
We will check for QuantiFERON gold blood work today Please take Levaquin 500 mg once a day for the next 7 days. We will repeat your CT scan of the chest in 2-3 weeks (after the Levaquin) to follow the pulmonary nodules and to help [place] plan possible biopsy through navigational bronchoscopy.  Follow with Dr Lamonte Sakai in 1 month to review results and plan next steps.

## 2017-09-08 NOTE — Progress Notes (Signed)
Subjective:    Patient ID: Lucas Price, male    DOB: 1940/08/19, 77 y.o.   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 He is on Spiriva and Symbicort. He has been followed by Dr. Chase Caller in our office for this and also for possible OSA/obesity hypoventilation syndrome, on BiPAP at night. He had the BCG vaccine.   He underwent a CT scan of his chest 07/22/17 to evaluate for interstitial lung disease.  I have reviewed all the radiology.  The CT scan showed 2 predominant nodular areas, one in the inferior right middle lobe 1.5 x 2.4 cm, another in the medial aspect of the left lower lobe 2.1 x 1.7 cm in the hilar region.  There is some associated narrowing and calcification of the adjacent medial left lower lobe airways.  A PET scan done on 08/03/17 showed that the medial left lower lobe lesion is significantly hypermetabolic.  There is mild hypermetabolism in the right middle lobe nodule as well.  He is referred today to discuss further workup.  He reports some dry cough, increased over last 4 months. No sputum, no hemoptysis. He has some decreased appetite, has lost about 30 lbs over 6 months.    Review of Systems  Past Medical History:  Diagnosis Date  . Anemia 05/26/2014  . Bladder neck obstruction 04/24/2014  . CKD (chronic kidney disease) 05/26/2014  . COPD (chronic obstructive pulmonary disease) ([place]) 05/26/2014  . Diabetes mellitus ([place]) 05/26/2014  . Former smoker 05/26/2014  . GERD (gastroesophageal reflux disease) 05/26/2014  . HTN (hypertension) 05/26/2014  . 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 Recurrent urinary tract infection 09/05/2013  . Renal calculus 03/19/2015  . Retention of urine 09/05/2013     Family History  Problem Relation Age of Onset  . Heart disease Mother   . Diabetes Mother        Social History   Socioeconomic History  . Marital status: Married    Spouse name: Not on file  . Number of children: Not on file  . Years of education: Not on file  . Highest education level: Not on file  Social Needs  . Financial resource strain: Not on file  . Food insecurity - worry: Not on file  . Food insecurity - inability: Not on file  . Transportation needs - medical: Not on file  . Transportation needs - non-medical: Not on file  Occupational History  . Not on file  Tobacco Use  . Smoking status: Former Smoker    Packs/day: 1.00    Years: 2.00    Pack years: 2.00    Last attempt to quit: 08/12/2007    Years since quitting: 10.0  . Smokeless tobacco: Never Used  Substance and Sexual Activity  . Alcohol use: No  . Drug use: No  . Sexual activity: Yes    Partners: Female  Other Topics Concern  . Not on file  Social History Narrative  . 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   No Known Allergies   Outpatient Medications Prior to Visit  Medication Sig Dispense Refill  . allopurinol (ZYLOPRIM) 100 MG tablet Take 1 tablet (100 mg total) daily by mouth. 90 tablet 1  .  aspirin EC 81 MG tablet Take 81 mg by mouth daily.     . budesonide-formoterol (SYMBICORT) 160-4.5 MCG/ACT inhaler Inhale 2 puffs into the lungs 2 (two) times daily.    . calcium carbonate (CALCIUM 600) 600 MG TABS tablet Take 600 mg by mouth daily.     . Cholecalciferol (D 10000) 10000 units CAPS Take by mouth 2 (two) times a week.     . Coenzyme Q-10 200 MG CAPS Take 200 mg by mouth daily.     . Continuous Blood Gluc Sensor ([place]) MISC Per instructions. 1 each 2  . glimepiride (AMARYL) 4 MG tablet Take 1 tablet (4 mg total) by mouth daily with breakfast. 90 tablet 1  . guaiFENesin (MUCINEX) 600 MG 12 hr tablet Take 1,200 mg by mouth 2 (two) times daily.     . insulin glargine (LANTUS) 100 UNIT/ML injection Inject 0.5 mLs (50 Units total) into the skin at bedtime. 5 vial 1  . Insulin Syringe-Needle U-100 (RELION INSULIN SYRINGE 1ML/31G) 31G X 5/16" 1 ML MISC 1 Syringe by Does not apply route 3 (three) times daily. 300 each 1  . losartan (COZAAR) 50 MG tablet Take 0.5-1 tablets (25-50 mg total) by mouth daily. 30 tablet 2  . metoprolol (TOPROL-XL) 200 MG 24 hr tablet Take 50 mg by mouth daily.     . OXYGEN Inhale 1.5 L into the lungs.    . sitaGLIPtin (JANUVIA) 100 MG tablet Take 50mg  daily    . Syringe, Disposable, 1 ML MISC Use with insulin needle. 100 each 3  . Tiotropium Bromide Monohydrate (SPIRIVA RESPIMAT) 2.5 MCG/ACT AERS Inhale 2 puffs into the lungs daily. 4 g 3  . torsemide (DEMADEX) 100 MG tablet Take 0.5 tablets (50 mg total) by mouth daily. 90 tablet 1  . Travoprost, BAK Free, (TRAVATAN Z) 0.004 % SOLN ophthalmic solution Use as directed    . triamcinolone cream (KENALOG) 0.1 % Use as directed as needed    . trimethoprim-polymyxin b (POLYTRIM) ophthalmic solution Place 1 drop into both eyes 4 (four) times daily. For 7 days to affected eye(s) 10 mL 0  . Continuous Blood Gluc Receiver (FREESTYLE LIBRE READER) DEVI 1 Device by Does not apply route once for 1 dose. 1 Device 0   No facility-administered medications prior to visit.          Objective:   Physical Exam Vitals:   09/08/17 1428  BP: 134/60  Pulse: 82  SpO2: 95%  Weight: 180 lb (81.6 kg)  Height: 5\' 3"  (1.6 m)   Gen: Pleasant, well-nourished, in no distress,  normal affect  ENT: No lesions,  mouth clear,  oropharynx clear, no postnasal drip  Neck: No JVD, no stridor  Lungs: No use of accessory muscles, clear bilaterally  Cardiovascular: RRR, heart sounds normal, no murmur or gallops, no peripheral edema  Musculoskeletal: No deformities, no cyanosis or clubbing  Neuro: alert, non focal  Skin: Warm, no lesions or rash     Assessment & Plan:  Pulmonary  nodules In the left hilar region (left lower lobe) and the right middle lobe.  The left hilar nodule is hypermetabolic by PET scan, concerning for possible malignancy.  I believe it is reasonable to try treating him empirically for a possible rounded pneumonia to see if the lesions resolve.  If so then we will defer biopsy.  I will repeat a CT scan of the chest, super D cuts, after the Levaquin.  If the nodules persist then  I believe he needs navigational bronchoscopy.  Also we will perform TB testing since he did have a TB exposure through a family member in the past.  Baltazar Apo, MD, PhD 09/08/2017, 3:02 PM [facility] [phone] or if no answer [phone]

## 2017-09-09 DIAGNOSIS — I129 Hypertensive chronic kidney disease with stage 1 through stage 4 chronic kidney disease, or unspecified chronic kidney disease: Secondary | ICD-10-CM | POA: Diagnosis not present

## 2017-09-09 DIAGNOSIS — D631 Anemia in chronic kidney disease: Secondary | ICD-10-CM | POA: Diagnosis not present

## 2017-09-09 DIAGNOSIS — N183 Chronic kidney disease, stage 3 (moderate): Secondary | ICD-10-CM | POA: Diagnosis not present

## 2017-09-09 DIAGNOSIS — N2581 Secondary hyperparathyroidism of renal origin: Secondary | ICD-10-CM | POA: Diagnosis not present

## 2017-09-10 LAB — QUANTIFERON-TB GOLD PLUS
Mitogen-NIL: >10 IU/mL
NIL: 0.07 IU/mL
QuantiFERON-TB Gold Plus: NEGATIVE
TB1-NIL: 0.03 [IU]/mL
TB2-NIL: 0.06 [IU]/mL

## 2017-09-11 ENCOUNTER — Telehealth: Payer: Self-pay | Admitting: Emergency Medicine

## 2017-09-11 NOTE — Telephone Encounter (Signed)
Spoke with pt, he rescheduled his CT and nothing further is needed.

## 2017-09-15 DIAGNOSIS — R0602 Shortness of breath: Secondary | ICD-10-CM | POA: Diagnosis not present

## 2017-09-18 ENCOUNTER — Ambulatory Visit (INDEPENDENT_AMBULATORY_CARE_PROVIDER_SITE_OTHER)
Admission: RE | Admit: 2017-09-18 | Discharge: 2017-09-18 | Disposition: A | Discharge status: Home / Self Care | Payer: Medicare Other | Source: Ambulatory Visit | Attending: Emergency Medicine | Admitting: Emergency Medicine

## 2017-09-18 DIAGNOSIS — R918 Other nonspecific abnormal finding of lung field: Secondary | ICD-10-CM | POA: Diagnosis not present

## 2017-09-18 DIAGNOSIS — R911 Solitary pulmonary nodule: Secondary | ICD-10-CM | POA: Diagnosis not present

## 2017-09-22 ENCOUNTER — Telehealth: Payer: Self-pay | Admitting: Emergency Medicine

## 2017-09-22 ENCOUNTER — Encounter (HOSPITAL_COMMUNITY)
Admission: RE | Disposition: A | Discharge status: Home / Self Care | Payer: Self-pay | Source: Ambulatory Visit | Attending: Gastroenterology

## 2017-09-22 ENCOUNTER — Ambulatory Visit (HOSPITAL_COMMUNITY): Payer: Medicare Other | Admitting: Anesthesiology

## 2017-09-22 ENCOUNTER — Encounter (HOSPITAL_COMMUNITY): Payer: Self-pay | Admitting: Anesthesiology

## 2017-09-22 ENCOUNTER — Ambulatory Visit (HOSPITAL_COMMUNITY)
Admission: RE | Admit: 2017-09-22 | Discharge: 2017-09-22 | Disposition: A | Discharge status: Home / Self Care | Payer: Medicare Other | Source: Ambulatory Visit | Attending: Gastroenterology | Admitting: Gastroenterology

## 2017-09-22 ENCOUNTER — Other Ambulatory Visit: Payer: Self-pay

## 2017-09-22 DIAGNOSIS — Z9841 Cataract extraction status, right eye: Secondary | ICD-10-CM | POA: Insufficient documentation

## 2017-09-22 DIAGNOSIS — Z9981 Dependence on supplemental oxygen: Secondary | ICD-10-CM | POA: Insufficient documentation

## 2017-09-22 DIAGNOSIS — Z794 Long term (current) use of insulin: Secondary | ICD-10-CM | POA: Diagnosis not present

## 2017-09-22 DIAGNOSIS — E1122 Type 2 diabetes mellitus with diabetic chronic kidney disease: Secondary | ICD-10-CM | POA: Diagnosis not present

## 2017-09-22 DIAGNOSIS — Q794 Prune belly syndrome: Secondary | ICD-10-CM | POA: Diagnosis not present

## 2017-09-22 DIAGNOSIS — Z9842 Cataract extraction status, left eye: Secondary | ICD-10-CM | POA: Diagnosis not present

## 2017-09-22 DIAGNOSIS — K6389 Other specified diseases of intestine: Secondary | ICD-10-CM

## 2017-09-22 DIAGNOSIS — Z87442 Personal history of urinary calculi: Secondary | ICD-10-CM | POA: Diagnosis not present

## 2017-09-22 DIAGNOSIS — N189 Chronic kidney disease, unspecified: Secondary | ICD-10-CM | POA: Insufficient documentation

## 2017-09-22 DIAGNOSIS — R933 Abnormal findings on diagnostic imaging of other parts of digestive tract: Secondary | ICD-10-CM | POA: Diagnosis present

## 2017-09-22 DIAGNOSIS — K6289 Other specified diseases of anus and rectum: Secondary | ICD-10-CM | POA: Diagnosis not present

## 2017-09-22 DIAGNOSIS — N320 Bladder-neck obstruction: Secondary | ICD-10-CM | POA: Diagnosis not present

## 2017-09-22 DIAGNOSIS — J449 Chronic obstructive pulmonary disease, unspecified: Secondary | ICD-10-CM | POA: Insufficient documentation

## 2017-09-22 DIAGNOSIS — K219 Gastro-esophageal reflux disease without esophagitis: Secondary | ICD-10-CM | POA: Diagnosis not present

## 2017-09-22 DIAGNOSIS — Z87440 Personal history of urinary (tract) infections: Secondary | ICD-10-CM | POA: Insufficient documentation

## 2017-09-22 DIAGNOSIS — D122 Benign neoplasm of ascending colon: Secondary | ICD-10-CM | POA: Insufficient documentation

## 2017-09-22 DIAGNOSIS — R339 Retention of urine, unspecified: Secondary | ICD-10-CM | POA: Insufficient documentation

## 2017-09-22 DIAGNOSIS — Z87891 Personal history of nicotine dependence: Secondary | ICD-10-CM | POA: Diagnosis not present

## 2017-09-22 DIAGNOSIS — N183 Chronic kidney disease, stage 3 (moderate): Secondary | ICD-10-CM | POA: Diagnosis not present

## 2017-09-22 DIAGNOSIS — I129 Hypertensive chronic kidney disease with stage 1 through stage 4 chronic kidney disease, or unspecified chronic kidney disease: Secondary | ICD-10-CM | POA: Insufficient documentation

## 2017-09-22 HISTORY — PX: COLONOSCOPY WITH PROPOFOL: SHX5780

## 2017-09-22 LAB — GLUCOSE, CAPILLARY: Glucose-Capillary: 112 mg/dL — ABNORMAL HIGH (ref 65–99)

## 2017-09-22 SURGERY — COLONOSCOPY WITH PROPOFOL
Anesthesia: Monitor Anesthesia Care

## 2017-09-22 MED ORDER — SODIUM CHLORIDE 0.9 % IV SOLN
INTRAVENOUS | Status: DC
  Administered 2017-09-22: 08:00:00 via INTRAVENOUS

## 2017-09-22 MED ORDER — PROPOFOL 10 MG/ML IV BOLUS
INTRAVENOUS | Status: DC | PRN
  Administered 2017-09-22: 20 mg via INTRAVENOUS

## 2017-09-22 MED ORDER — PROPOFOL 10 MG/ML IV BOLUS
INTRAVENOUS | Status: AC
  Filled 2017-09-22: qty 60

## 2017-09-22 MED ORDER — PHENYLEPHRINE 40 MCG/ML (10ML) SYRINGE FOR IV PUSH (FOR BLOOD PRESSURE SUPPORT)
PREFILLED_SYRINGE | INTRAVENOUS | Status: DC | PRN
  Administered 2017-09-22: 120 ug via INTRAVENOUS
  Administered 2017-09-22 (×2): 80 ug via INTRAVENOUS

## 2017-09-22 MED ORDER — LIDOCAINE 2% (20 MG/ML) 5 ML SYRINGE
INTRAMUSCULAR | Status: DC | PRN
  Administered 2017-09-22: 100 mg via INTRAVENOUS

## 2017-09-22 MED ORDER — PROPOFOL 500 MG/50ML IV EMUL
INTRAVENOUS | Status: DC | PRN
  Administered 2017-09-22: 120 ug/kg/min via INTRAVENOUS

## 2017-09-22 SURGICAL SUPPLY — 21 items

## 2017-09-22 NOTE — Anesthesia Procedure Notes (Signed)
Date/Time: 09/22/2017 8:44 AM Performed by: Sharlette Dense, CRNA Oxygen Delivery Method: Simple face mask

## 2017-09-22 NOTE — Interval H&P Note (Signed)
History and Physical Interval Note:  09/22/2017 8:39 AM  Lucas Price  has presented today for surgery, with the diagnosis of colon mass  The various methods of treatment have been discussed with the patient and family. After consideration of risks, benefits and other options for treatment, the patient has consented to  Procedure(s): COLONOSCOPY WITH PROPOFOL (N/A) as a surgical intervention .  The patient's history has been reviewed, patient examined, no change in status, stable for surgery.  I have reviewed the patient's chart and labs.  Questions were answered to the patient's satisfaction.     Nelida Meuse III

## 2017-09-22 NOTE — H&P (Signed)
History:  This patient presents for endoscopic testing for abnormal CT scan of GI tract.  Lucas Price Referring physician: Briscoe Deutscher, DO  Past Medical History: Past Medical History:  Diagnosis Date  . Anemia 05/26/2014  . Bladder neck obstruction 04/24/2014  . CKD (chronic kidney disease) 05/26/2014  . COPD (chronic obstructive pulmonary disease) ([place]) 05/26/2014  . Diabetes mellitus ([place]) 05/26/2014  . Former smoker 05/26/2014  . GERD (gastroesophageal reflux disease) 05/26/2014  . HTN (hypertension) 05/26/2014  . 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 Recurrent urinary tract infection 09/05/2013  . Renal calculus 03/19/2015  . Retention of urine 09/05/2013     Past Surgical History: Past Surgical History:  Procedure Laterality Date  . CATARACT EXTRACTION, BILATERAL    . HERNIA REPAIR Bilateral    Inguinal    Allergies: No Known Allergies  Outpatient Meds: Current Facility-Administered Medications  Medication Dose Route Frequency Provider Last Rate Last Dose  . 0.9 %  sodium chloride infusion   Intravenous Continuous Nelida Meuse III, MD 20 mL/hr at 09/22/17 0805        ___________________________________________________________________ Objective   Exam:  BP 126/68   Pulse 73   Temp 98.3 F (36.8 C) (Oral)   Resp 20   Ht 5\' 3"  (1.6 m)   Wt 180 lb (81.6 kg)   SpO2 92%   BMI 31.89 kg/m    CV: RRR without murmur, S1/S2, no JVD, no peripheral edema  Resp: clear to auscultation bilaterally, normal RR and effort noted.  Moving air well - comfortable on room air  GI: soft, no tenderness, with active bowel sounds. No guarding or palpable organomegaly noted.  Neuro: awake, alert and oriented x 3. Normal gross motor function and fluent speech   Assessment:  Abnml CT/PET scan Lung mass COPD  Plan:  colonoscopy   Nelida Meuse III

## 2017-09-22 NOTE — Discharge Instructions (Signed)
YOU HAD AN ENDOSCOPIC PROCEDURE TODAY: Refer to the procedure report and other information in the discharge instructions given to you for any specific questions about what was found during the examination. If this information does not answer your questions, please call [place] office at [phone] to clarify.  ° °YOU SHOULD EXPECT: Some feelings of bloating in the abdomen. Passage of more gas than usual. Walking can help get rid of the air that was put into your GI tract during the procedure and reduce the bloating. If you had a lower endoscopy (such as a colonoscopy or flexible sigmoidoscopy) you may notice spotting of blood in your stool or on the toilet paper. Some abdominal soreness may be present for a day or two, also. ° °DIET: Your first meal following the procedure should be a light meal and then it is ok to progress to your normal diet. A half-sandwich or bowl of soup is an example of a good first meal. Heavy or fried foods are harder to digest and may make you feel nauseous or bloated. Drink plenty of fluids but you should avoid alcoholic beverages for 24 hours. If you had a esophageal dilation, please see attached instructions for diet.   ° °ACTIVITY: Your care partner should take you home directly after the procedure. You should plan to take it easy, moving slowly for the rest of the day. You can resume normal activity the day after the procedure however YOU SHOULD NOT DRIVE, use power tools, machinery or perform tasks that involve climbing or major physical exertion for 24 hours (because of the sedation medicines used during the test).  ° °SYMPTOMS TO REPORT IMMEDIATELY: °A gastroenterologist can be reached at any hour. Please call [phone]  for any of the following symptoms:  °Following lower endoscopy (colonoscopy, flexible sigmoidoscopy) °Excessive amounts of blood in the stool  °Significant tenderness, worsening of abdominal pains  °Swelling of the abdomen that is new, acute  °Fever of 100° or  higher  °Following upper endoscopy (EGD, EUS, ERCP, esophageal dilation) °Vomiting of blood or coffee ground material  °New, significant abdominal pain  °New, significant chest pain or pain under the shoulder blades  °Painful or persistently difficult swallowing  °New shortness of breath  °Black, tarry-looking or red, bloody stools ° °FOLLOW UP:  °If any biopsies were taken you will be contacted by phone or by letter within the next 1-3 weeks. Call [phone]  if you have not heard about the biopsies in 3 weeks.  °Please also call with any specific questions about appointments or follow up tests. ° °

## 2017-09-22 NOTE — Anesthesia Postprocedure Evaluation (Signed)
Anesthesia Post Note  Patient: Lucas Price  Procedure(s) Performed: COLONOSCOPY WITH PROPOFOL (N/A )     Patient location during evaluation: Endoscopy Anesthesia Type: MAC Level of consciousness: awake and alert Pain management: pain level controlled Vital Signs Assessment: post-procedure vital signs reviewed and stable Respiratory status: spontaneous breathing, nonlabored ventilation, respiratory function stable and patient connected to nasal cannula oxygen Cardiovascular status: stable and blood pressure returned to baseline Postop Assessment: no apparent nausea or vomiting Anesthetic complications: no    Last Vitals:  Vitals:   09/22/17 0930 09/22/17 0940  BP: (!) 111/56 (!) 114/50  Pulse: 73 73  Resp: 18 (!) 21  Temp:    SpO2: 100% 100%    Last Pain:  Vitals:   09/22/17 0921  TempSrc: Axillary                 Montez Hageman

## 2017-09-22 NOTE — Transfer of Care (Signed)
Immediate Anesthesia Transfer of Care Note  Patient: Lucas Price  Procedure(s) Performed: COLONOSCOPY WITH PROPOFOL (N/A )  Patient Location: Endoscopy Unit  Anesthesia Type:MAC  Level of Consciousness: awake  Airway & Oxygen Therapy: Patient Spontanous Breathing and Patient connected to face mask oxygen  Post-op Assessment: Report given to RN and Post -op Vital signs reviewed and stable  Post vital signs: Reviewed and stable  Last Vitals:  Vitals:   09/22/17 0755  BP: 126/68  Pulse: 73  Resp: 20  Temp: 36.8 C  SpO2: 92%    Last Pain:  Vitals:   09/22/17 0755  TempSrc: Oral         Complications: No apparent anesthesia complications

## 2017-09-22 NOTE — Anesthesia Preprocedure Evaluation (Signed)
Anesthesia Evaluation  Patient identified by MRN, date of birth, ID band Patient awake    Reviewed: Allergy & Precautions, NPO status , Patient's Chart, lab work & pertinent test results  Airway Mallampati: II  TM Distance: >3 FB Neck ROM: Full    Dental no notable dental hx.    Pulmonary COPD,  COPD inhaler and oxygen dependent, former smoker,    Pulmonary exam normal breath sounds clear to auscultation       Cardiovascular hypertension, Pt. on medications Normal cardiovascular exam Rhythm:Regular Rate:Normal     Neuro/Psych negative neurological ROS  negative psych ROS   GI/Hepatic negative GI ROS, Neg liver ROS,   Endo/Other  diabetes, Type 2, Oral Hypoglycemic Agents, Insulin Dependent  Renal/GU negative Renal ROS  negative genitourinary   Musculoskeletal negative musculoskeletal ROS (+)   Abdominal   Peds negative pediatric ROS (+)  Hematology negative hematology ROS (+)   Anesthesia Other Findings   Reproductive/Obstetrics negative OB ROS                             Anesthesia Physical Anesthesia Plan  ASA: III  Anesthesia Plan: MAC   Post-op Pain Management:    Induction:   PONV Risk Score and Plan: 1 and Treatment may vary due to age or medical condition  Airway Management Planned: Simple Face Mask  Additional Equipment:   Intra-op Plan:   Post-operative Plan:   Informed Consent: I have reviewed the patients History and Physical, chart, labs and discussed the procedure including the risks, benefits and alternatives for the proposed anesthesia with the patient or authorized representative who has indicated his/her understanding and acceptance.   Dental advisory given  Plan Discussed with: CRNA  Anesthesia Plan Comments:         Anesthesia Quick Evaluation

## 2017-09-22 NOTE — Op Note (Signed)
[facility] Patient Name: Lucas Price Procedure Date: 09/22/2017 MRN: [id number] Attending MD: Estill Cotta. Loletha Carrow , MD Date of Birth: 27-Jan-1941 CSN: [id number] Age: 77 Admit Type: Outpatient Procedure:                Colonoscopy Indications:              Abnormal PET scan of the GI tract (suggesting                            increased activity in ascending colon) Providers:                Estill Cotta. Loletha Carrow, MD, Cleda Daub, RN, William Dalton, Technician, Charolette Child, Technician Referring MD:             Brand Males, MD Medicines:                Monitored Anesthesia Care Complications:            No immediate complications. Estimated Blood Loss:     Estimated blood loss: none. Procedure:                Pre-Anesthesia Assessment:                           - Prior to the procedure, a History and Physical                            was performed, and patient medications and                            allergies were reviewed. The patient's tolerance of                            previous anesthesia was also reviewed. The risks                            and benefits of the procedure and the sedation                            options and risks were discussed with the patient.                            All questions were answered, and informed consent                            was obtained. Anticoagulants: The patient has taken                            aspirin. It was decided not to withhold this                            medication prior to the procedure. ASA Grade  Assessment: III - A patient with severe systemic                            disease. After reviewing the risks and benefits,                            the patient was deemed in satisfactory condition to                            undergo the procedure.                           After obtaining informed consent, the colonoscope         was passed under direct vision. Throughout the                            procedure, the patient's blood pressure, pulse, and                            oxygen saturations were monitored continuously. The                            EC-[id number] ([id number]) scope was introduced through                            the anus and advanced to the the cecum, identified                            by appendiceal orifice and ileocecal valve. The                            colonoscopy was performed with difficulty due to                            significant looping and the patient's body habitus.                            Successful completion of the procedure was aided by                            using manual pressure. The patient tolerated the                            procedure well. The quality of the bowel                            preparation was poor. The ileocecal valve,                            appendiceal orifice, and rectum were photographed. Scope In: 8:50:37 AM Scope Out: 9:14:15 AM Scope Withdrawal Time: 0 hours 9 minutes 54 seconds  Total Procedure Duration: 0 hours 23 minutes 38 seconds  Findings:      The digital rectal exam findings  include decreased sphincter tone.      A large amount of liquid stool was found in the entire colon,       interfering with visualization. Lavage of the area was performed using a       large amount, resulting in incomplete clearance with fair visualization.       However, the ascending colon was cleared well enough to exclude any       other significant abnormalities.      An 8-10 mm polyp was found in the proximal ascending colon. The polyp       was sessile. The polyp was removed with a hot snare. Resection and       retrieval were complete.      Retroflexion in the rectum was not performed due to inability to retain       air.      The exam was otherwise without abnormality. Impression:               - Preparation of the colon was poor.                            - Decreased sphincter tone found on digital rectal                            exam.                           - Stool in the entire examined colon.                           - One 8-10 mm polyp in the proximal ascending                            colon, removed with a hot snare. Resected and                            retrieved.                           - The examination was otherwise normal. Moderate Sedation:      MAC sedation used Recommendation:           - Patient has a contact number available for                            emergencies. The signs and symptoms of potential                            delayed complications were discussed with the                            patient. Return to normal activities tomorrow.                            Written discharge instructions were provided to the                            patient.                           -  Resume previous diet.                           - No aspirin, ibuprofen, naproxen, or other                            non-steroidal anti-inflammatory drugs for 5 days                            after polyp removal.                           - Await pathology results.                           - No recommendation at this time regarding repeat                            colonoscopy. Procedure Code(s):        --- Professional ---                           [phone], Colonoscopy, flexible; with removal of                            tumor(s), polyp(s), or other lesion(s) by snare                            technique Diagnosis Code(s):        --- Professional ---                           K62.89, Other specified diseases of anus and rectum                           D12.2, Benign neoplasm of ascending colon                           R93.3, Abnormal findings on diagnostic imaging of                            other parts of digestive tract CPT copyright 2016 American Medical Association. All rights reserved. The  codes documented in this report are preliminary and upon coder review may  be revised to meet current compliance requirements. Henry L. Loletha Carrow, MD 09/22/2017 9:22:48 AM This report has been signed electronically. Number of Addenda: 0

## 2017-09-22 NOTE — Telephone Encounter (Signed)
RB, please advise on patients results. Thank you!

## 2017-09-23 ENCOUNTER — Encounter (HOSPITAL_COMMUNITY): Payer: Self-pay | Admitting: Gastroenterology

## 2017-09-23 ENCOUNTER — Inpatient Hospital Stay: Admission: RE | Admit: 2017-09-23 | Payer: Medicare Other | Source: Ambulatory Visit

## 2017-09-23 NOTE — Telephone Encounter (Signed)
RB please advise patient wants to see you for OV to discuss bronch. You do not have anything until 2.20.19 patient only wants to see you and wants to be seen sooner. Please advise

## 2017-09-23 NOTE — Telephone Encounter (Signed)
Called and spoke with pt. Pt is requesting call today from [facility] directly regarding CT results.   RB please advise. Thanks

## 2017-09-23 NOTE — Telephone Encounter (Signed)
Patient is asking to speak to Dr. Lamonte Sakai, [place].  He is very anxious and upset because he has not heard back about his results.

## 2017-09-23 NOTE — Telephone Encounter (Signed)
Please let him knoqw that I have reviewed his CT chest. The 2 pulmonary nodules are unchanged in size or appearance after treatment with antibiotics. Based on this I believe that we should consider bronchoscopy to perform biopsies. If he agrees then we can set this up. We could also set up OV to discuss further in person.

## 2017-09-24 NOTE — Telephone Encounter (Signed)
RB is not back in the office before 09/30/17.

## 2017-09-24 NOTE — Telephone Encounter (Signed)
Patient's appt has been moved from 430 to 945am on 2/20. Patient is aware. Nothing else is needed at time of call.

## 2017-09-24 NOTE — Telephone Encounter (Signed)
Will forward to LL to see if we can work him in.

## 2017-09-30 ENCOUNTER — Encounter: Payer: Self-pay | Admitting: Gastroenterology

## 2017-09-30 ENCOUNTER — Ambulatory Visit: Payer: PRIVATE HEALTH INSURANCE | Admitting: Emergency Medicine

## 2017-09-30 ENCOUNTER — Ambulatory Visit (INDEPENDENT_AMBULATORY_CARE_PROVIDER_SITE_OTHER): Payer: Medicare Other | Admitting: Emergency Medicine

## 2017-09-30 ENCOUNTER — Encounter: Payer: Self-pay | Admitting: Emergency Medicine

## 2017-09-30 VITALS — BP 130/74 | HR 81 | Ht 63.0 in | Wt 183.0 lb

## 2017-09-30 DIAGNOSIS — R918 Other nonspecific abnormal finding of lung field: Secondary | ICD-10-CM | POA: Diagnosis not present

## 2017-09-30 NOTE — Patient Instructions (Signed)
We will plan for bronchoscopy with navigation and endobronchial ultrasound to better define your pulmonary nodules. Please stop your aspirin 2 days prior to the procedure. Follow with Dr Lamonte Sakai next available after the procedure to discuss your results and plan next steps.

## 2017-09-30 NOTE — Assessment & Plan Note (Signed)
Neither of the pulmonary nodules have changed in size or shape.  Both have some degree of hypermetabolism.  Interestingly the most hypermetabolic in the left hilum also has calcium present.  Both are moderately suspicious for malignancy.  Discussed the pros and cons of tissue diagnosis with him and he wants to proceed.  Best test will be navigational bronchoscopy, endobronchial ultrasound under general anesthesia.  I will perform Percepta screening as well.

## 2017-09-30 NOTE — H&P (View-Only) (Signed)
Subjective:    Patient ID: Lucas Price, male    DOB: 1941/01/28, 77 y.o.   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 He is on Spiriva and Symbicort. He has been followed by Dr. Chase Caller in our office for this and also for possible OSA/obesity hypoventilation syndrome, on BiPAP at night. He had the BCG vaccine.   He underwent a CT scan of his chest 07/22/17 to evaluate for interstitial lung disease.  I have reviewed all the radiology.  The CT scan showed 2 predominant nodular areas, one in the inferior right middle lobe 1.5 x 2.4 cm, another in the medial aspect of the left lower lobe 2.1 x 1.7 cm in the hilar region.  There is some associated narrowing and calcification of the adjacent medial left lower lobe airways.  A PET scan done on 08/03/17 showed that the medial left lower lobe lesion is significantly hypermetabolic.  There is mild hypermetabolism in the right middle lobe nodule as well.  He is referred today to discuss further workup.  He reports some dry cough, increased over last 4 months. No sputum, no hemoptysis. He has some decreased appetite, has lost about 30 lbs over 6 months.   ROV 09/30/17 --this is a follow-up visit for patient with a minimal smoking history, history of OSA and obesity hypoventilation syndrome on BiPAP.  I am following him for an abnormal CT scan of his chest that shows 2 nodular areas: Inferior right middle lobe and medial aspect of the left lower lobe in the perihilar region.  The medial left lower lobe lesion is hypermetabolic by PET scan 57/84/69.  I treated him empirically with Levaquin to see if the areas were changed. Developed thrush, took it for 5 days. His QuantiFERON gold was negative.  Repeat CT scan of the chest done on 09/18/17 was reviewed by me.  This shows that both  nodules persist, are unchanged in size or appearance.  He returns today to discuss next steps.   Review of Systems  Past Medical History:  Diagnosis Date  . Anemia 05/26/2014  . Bladder neck obstruction 04/24/2014  . CKD (chronic kidney disease) 05/26/2014  . COPD (chronic obstructive pulmonary disease) ([place]) 05/26/2014  . Diabetes mellitus ([place]) 05/26/2014  . Former smoker 05/26/2014  . GERD (gastroesophageal reflux disease) 05/26/2014  . HTN (hypertension) 05/26/2014  . 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 Recurrent urinary tract infection 09/05/2013  . Renal calculus 03/19/2015  . Retention of urine 09/05/2013     Family History  Problem Relation Age of Onset  . Heart disease Mother   . Diabetes Mother      Social History   Socioeconomic History  . Marital status: Married    Spouse name: Not on file  . Number of children: Not on file  . Years of education: Not on file  . Highest education level: Not on file  Social Needs  . Financial resource strain: Not on file  . Food insecurity - worry: Not on file  . Food insecurity - inability: Not on file  . Transportation needs - medical: Not on file  . Transportation needs - non-medical: Not on file  Occupational History  . Not on file  Tobacco Use  . Smoking status: Former Smoker    Packs/day: 1.00    Years: 2.00  Pack years: 2.00    Last attempt to quit: 08/12/2007    Years since quitting: 10.1  . Smokeless tobacco: Never Used  Substance and Sexual Activity  . Alcohol use: No  . Drug use: No  . Sexual activity: Yes    Partners: Female  Other Topics Concern  . Not on file  Social History Narrative  . 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   No Known Allergies   Outpatient Medications Prior to Visit  Medication Sig Dispense Refill  . allopurinol (ZYLOPRIM) 100 MG tablet Take 1 tablet (100 mg total)  daily by mouth. 90 tablet 1  . aspirin EC 81 MG tablet Take 81 mg by mouth daily.     . budesonide-formoterol (SYMBICORT) 160-4.5 MCG/ACT inhaler Inhale 2 puffs into the lungs 2 (two) times daily.    . calcium carbonate (CALCIUM 600) 600 MG TABS tablet Take 600 mg by mouth daily.     . Cholecalciferol (D 10000) 10000 units CAPS Take 10,000 Units by mouth 2 (two) times a week.     . Coenzyme Q-10 200 MG CAPS Take 200 mg by mouth daily.     . Continuous Blood Gluc Sensor ([place]) MISC Per instructions. 1 each 2  . glimepiride (AMARYL) 4 MG tablet Take 1 tablet (4 mg total) by mouth daily with breakfast. 90 tablet 1  . guaiFENesin (MUCINEX) 600 MG 12 hr tablet Take 1,200 mg by mouth 2 (two) times daily.    . insulin glargine (LANTUS) 100 UNIT/ML injection Inject 0.5 mLs (50 Units total) into the skin at bedtime. 5 vial 1  . Insulin Syringe-Needle U-100 (RELION INSULIN SYRINGE 1ML/31G) 31G X 5/16" 1 ML MISC 1 Syringe by Does not apply route 3 (three) times daily. 300 each 1  . losartan (COZAAR) 50 MG tablet Take 0.5-1 tablets (25-50 mg total) by mouth daily. (Patient taking differently: Take 12.5 mg by mouth daily. ) 30 tablet 2  . metoprolol (TOPROL-XL) 200 MG 24 hr tablet Take 50 mg by mouth daily.     . OXYGEN Inhale 1.5 L into the lungs.    . sitaGLIPtin (JANUVIA) 100 MG tablet Take 50mg  daily    . Syringe, Disposable, 1 ML MISC Use with insulin needle. 100 each 3  . Tiotropium Bromide Monohydrate (SPIRIVA RESPIMAT) 2.5 MCG/ACT AERS Inhale 2 puffs into the lungs daily. 4 g 3  . torsemide (DEMADEX) 100 MG tablet Take 0.5 tablets (50 mg total) by mouth daily. (Patient taking differently: Take 16 mg by mouth daily. ) 90 tablet 1  . Travoprost, BAK Free, (TRAVATAN Z) 0.004 % SOLN ophthalmic solution Place 1 drop in both eyes once daily    . Continuous Blood Gluc Receiver (FREESTYLE LIBRE READER) DEVI 1 Device by Does not apply route once for 1 dose. (Patient not taking: Reported  on 09/10/2017) 1 Device 0   No facility-administered medications prior to visit.          Objective:   Physical Exam Vitals:   09/30/17 0933  BP: 130/74  Pulse: 81  SpO2: 91%  Weight: 183 lb (83 kg)  Height: 5\' 3"  (1.6 m)   Gen: Pleasant, well-nourished, in no distress,  normal affect  ENT: No lesions,  mouth clear,  oropharynx clear, no postnasal drip  Neck: No JVD, no stridor  Lungs: No use of  accessory muscles, clear bilaterally  Cardiovascular: RRR, heart sounds normal, no murmur or gallops, no peripheral edema  Musculoskeletal: No deformities, no cyanosis or clubbing  Neuro: alert, non focal  Skin: Warm, no lesions or rash     Assessment & Plan:  Pulmonary nodules Neither of the pulmonary nodules have changed in size or shape.  Both have some degree of hypermetabolism.  Interestingly the most hypermetabolic in the left hilum also has calcium present.  Both are moderately suspicious for malignancy.  Discussed the pros and cons of tissue diagnosis with him and he wants to proceed.  Best test will be navigational bronchoscopy, endobronchial ultrasound under general anesthesia.  I will perform Percepta screening as well.   Baltazar Apo, MD, PhD 09/30/2017, 10:00 AM [facility] [phone] or if no answer [phone]

## 2017-09-30 NOTE — Progress Notes (Signed)
Subjective:    Patient ID: Lucas Price, male    DOB: [birth year], 77 y.o.   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 He is on Spiriva and Symbicort. He has been followed by Dr. Chase Caller in our office for this and also for possible OSA/obesity hypoventilation syndrome, on BiPAP at night. He had the BCG vaccine.   He underwent a CT scan of his chest 07/22/17 to evaluate for interstitial lung disease.  I have reviewed all the radiology.  The CT scan showed 2 predominant nodular areas, one in the inferior right middle lobe 1.5 x 2.4 cm, another in the medial aspect of the left lower lobe 2.1 x 1.7 cm in the hilar region.  There is some associated narrowing and calcification of the adjacent medial left lower lobe airways.  A PET scan done on 08/03/17 showed that the medial left lower lobe lesion is significantly hypermetabolic.  There is mild hypermetabolism in the right middle lobe nodule as well.  He is referred today to discuss further workup.  He reports some dry cough, increased over last 4 months. No sputum, no hemoptysis. He has some decreased appetite, has lost about 30 lbs over 6 months.   ROV 09/30/17 --this is a follow-up visit for patient with a minimal smoking history, history of OSA and obesity hypoventilation syndrome on BiPAP.  I am following him for an abnormal CT scan of his chest that shows 2 nodular areas: Inferior right middle lobe and medial aspect of the left lower lobe in the perihilar region.  The medial left lower lobe lesion is hypermetabolic by PET scan 10/62/69.  I treated him empirically with Levaquin to see if the areas were changed. Developed thrush, took it for 5 days. His QuantiFERON gold was negative.  Repeat CT scan of the chest done on 09/18/17 was reviewed by me.  This shows that both  nodules persist, are unchanged in size or appearance.  He returns today to discuss next steps.   Review of Systems  Past Medical History:  Diagnosis Date  . Anemia 05/26/2014  . Bladder neck obstruction 04/24/2014  . CKD (chronic kidney disease) 05/26/2014  . COPD (chronic obstructive pulmonary disease) ([place]) 05/26/2014  . Diabetes mellitus ([place]) 05/26/2014  . Former smoker 05/26/2014  . GERD (gastroesophageal reflux disease) 05/26/2014  . HTN (hypertension) 05/26/2014  . 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 Recurrent urinary tract infection 09/05/2013  . Renal calculus 03/19/2015  . Retention of urine 09/05/2013     Family History  Problem Relation Age of Onset  . Heart disease Mother   . Diabetes Mother      Social History   Socioeconomic History  . Marital status: Married    Spouse name: Not on file  . Number of children: Not on file  . Years of education: Not on file  . Highest education level: Not on file  Social Needs  . Financial resource strain: Not on file  . Food insecurity - worry: Not on file  . Food insecurity - inability: Not on file  . Transportation needs - medical: Not on file  . Transportation needs - non-medical: Not on file  Occupational History  . Not on file  Tobacco Use  . Smoking status: Former Smoker    Packs/day: 1.00    Years: 2.00  Pack years: 2.00    Last attempt to quit: 08/12/2007    Years since quitting: 10.1  . Smokeless tobacco: Never Used  Substance and Sexual Activity  . Alcohol use: No  . Drug use: No  . Sexual activity: Yes    Partners: Female  Other Topics Concern  . Not on file  Social History Narrative  . 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   No Known Allergies   Outpatient Medications Prior to Visit  Medication Sig Dispense Refill  . allopurinol (ZYLOPRIM) 100 MG tablet Take 1 tablet (100 mg total)  daily by mouth. 90 tablet 1  . aspirin EC 81 MG tablet Take 81 mg by mouth daily.     . budesonide-formoterol (SYMBICORT) 160-4.5 MCG/ACT inhaler Inhale 2 puffs into the lungs 2 (two) times daily.    . calcium carbonate (CALCIUM 600) 600 MG TABS tablet Take 600 mg by mouth daily.     . Cholecalciferol (D 10000) 10000 units CAPS Take 10,000 Units by mouth 2 (two) times a week.     . Coenzyme Q-10 200 MG CAPS Take 200 mg by mouth daily.     . Continuous Blood Gluc Sensor ([place]) MISC Per instructions. 1 each 2  . glimepiride (AMARYL) 4 MG tablet Take 1 tablet (4 mg total) by mouth daily with breakfast. 90 tablet 1  . guaiFENesin (MUCINEX) 600 MG 12 hr tablet Take 1,200 mg by mouth 2 (two) times daily.    . insulin glargine (LANTUS) 100 UNIT/ML injection Inject 0.5 mLs (50 Units total) into the skin at bedtime. 5 vial 1  . Insulin Syringe-Needle U-100 (RELION INSULIN SYRINGE 1ML/31G) 31G X 5/16" 1 ML MISC 1 Syringe by Does not apply route 3 (three) times daily. 300 each 1  . losartan (COZAAR) 50 MG tablet Take 0.5-1 tablets (25-50 mg total) by mouth daily. (Patient taking differently: Take 12.5 mg by mouth daily. ) 30 tablet 2  . metoprolol (TOPROL-XL) 200 MG 24 hr tablet Take 50 mg by mouth daily.     . OXYGEN Inhale 1.5 L into the lungs.    . sitaGLIPtin (JANUVIA) 100 MG tablet Take 50mg  daily    . Syringe, Disposable, 1 ML MISC Use with insulin needle. 100 each 3  . Tiotropium Bromide Monohydrate (SPIRIVA RESPIMAT) 2.5 MCG/ACT AERS Inhale 2 puffs into the lungs daily. 4 g 3  . torsemide (DEMADEX) 100 MG tablet Take 0.5 tablets (50 mg total) by mouth daily. (Patient taking differently: Take 16 mg by mouth daily. ) 90 tablet 1  . Travoprost, BAK Free, (TRAVATAN Z) 0.004 % SOLN ophthalmic solution Place 1 drop in both eyes once daily    . Continuous Blood Gluc Receiver (FREESTYLE LIBRE READER) DEVI 1 Device by Does not apply route once for 1 dose. (Patient not taking: Reported  on 09/10/2017) 1 Device 0   No facility-administered medications prior to visit.          Objective:   Physical Exam Vitals:   09/30/17 0933  BP: 130/74  Pulse: 81  SpO2: 91%  Weight: 183 lb (83 kg)  Height: 5\' 3"  (1.6 m)   Gen: Pleasant, well-nourished, in no distress,  normal affect  ENT: No lesions,  mouth clear,  oropharynx clear, no postnasal drip  Neck: No JVD, no stridor  Lungs: No use of  accessory muscles, clear bilaterally  Cardiovascular: RRR, heart sounds normal, no murmur or gallops, no peripheral edema  Musculoskeletal: No deformities, no cyanosis or clubbing  Neuro: alert, non focal  Skin: Warm, no lesions or rash     Assessment & Plan:  Pulmonary nodules Neither of the pulmonary nodules have changed in size or shape.  Both have some degree of hypermetabolism.  Interestingly the most hypermetabolic in the left hilum also has calcium present.  Both are moderately suspicious for malignancy.  Discussed the pros and cons of tissue diagnosis with him and he wants to proceed.  Best test will be navigational bronchoscopy, endobronchial ultrasound under general anesthesia.  I will perform Percepta screening as well.   Baltazar Apo, MD, PhD 09/30/2017, 10:00 AM [facility] [phone] or if no answer [phone]

## 2017-10-02 ENCOUNTER — Other Ambulatory Visit: Payer: Self-pay | Admitting: Family Medicine

## 2017-10-02 ENCOUNTER — Telehealth: Payer: Self-pay | Admitting: Emergency Medicine

## 2017-10-02 NOTE — Telephone Encounter (Signed)
Pt request refill on travoprost, BAK Free, 0.004% This prescription has a historical provider.  LOV  08/21/17 PCP  Dr. Briscoe Deutscher  [facility] [phone]   [address] [place]

## 2017-10-02 NOTE — Telephone Encounter (Signed)
Copied from [place] [id number]. Topic: Quick Communication - Rx Refill/Question >> Oct 02, 2017 12:23 PM Lucas Price wrote: Medication: Travoprost, BAK Free, (TRAVATAN Z) 0.004 % SOLN ophthalmic solution [[id number]]    Has the patient contacted their pharmacy? Yes.     (Agent: If no, request that the patient contact the pharmacy for the refill.)   Preferred Pharmacy (with phone number or street name): [facility]   Agent: Please be advised that RX refills may take up to 3 business days. We ask that you follow-up with your pharmacy.

## 2017-10-02 NOTE — Telephone Encounter (Signed)
Left message for patient to call back  

## 2017-10-05 ENCOUNTER — Telehealth: Payer: Self-pay | Admitting: Emergency Medicine

## 2017-10-05 NOTE — Telephone Encounter (Signed)
Ok to refill 

## 2017-10-05 NOTE — Telephone Encounter (Signed)
Called and spoke with Ailene Ravel at short stay stating to her that pt is to stop aspirin two days prior to bronch and that it was stated to pt in last OV to stop aspirin two days prior.  I asked Ailene Ravel if she could remind pt to do that when she called pt to go over all the other details and she stated she would tell him that again.  Nothing further needed at this current time.

## 2017-10-06 ENCOUNTER — Encounter (HOSPITAL_COMMUNITY): Payer: Self-pay | Admitting: *Deleted

## 2017-10-06 ENCOUNTER — Other Ambulatory Visit: Payer: Self-pay

## 2017-10-06 NOTE — Telephone Encounter (Signed)
Called [facility] to see if they needed records faxed to them. Estill Bamberg stated she does not need anything from [place].  I called pt he wants RB to review his download to see if there are any changes that need to be made. I called [employer] back and advised them to fax over a download from his [employer] machine. Kristi stated she would fax this over to me. Will await download and then place in [facility] folder.

## 2017-10-06 NOTE — Telephone Encounter (Signed)
Called pt and advised message from the provider. Pt understood and verbalized understanding. Nothing further is needed.    

## 2017-10-06 NOTE — Telephone Encounter (Signed)
Please let the patient know that I reviewed his ventilator download information.  It shows great compliance.  His settings appear to be good.  I would not make any changes at this time.

## 2017-10-06 NOTE — Progress Notes (Signed)
Pt under the care of Dr. Woody Seller, Cardiology; requested Echo, LOV note and any cardiac studies. Pt stated that a stress test was performed at [facility] in [place] 4-5 years ago; requested discharge summary, stress test and any cardiac studies. Pt stated that he stopped Aspirin 2 days prior to procedure as instructed. Pt made aware to stop taking vitamins, fish oil, Co Q 10 and herbal medications. Do not take any NSAIDs ie: Ibuprofen, Advil, Naproxen (Aleve), Motrin, BC and Goody Powder. Pt made aware to not take Januvia and Glimepiride on DOS. Pt made aware to take half dose of Lantus insulin tonight ( 18 units instead of 36).Pt made aware to check BG every 2 hours prior to arrival to hospital on DOS. Pt made aware to treat a BG < 70 with 4 ounces of apple or cranberry juice, wait 15 minutes after intervention to recheck BG, if BG remains < 70, call Short Stay unit to speak with a nurse. Pt verbalized understanding of all pre-op instructions.

## 2017-10-06 NOTE — Telephone Encounter (Signed)
Received download, will place in RB look at folder.

## 2017-10-06 NOTE — Telephone Encounter (Signed)
This should be prescribed by [facility]. If he needs courtesy fill, okay x 1.

## 2017-10-07 ENCOUNTER — Ambulatory Visit (HOSPITAL_COMMUNITY)
Admission: RE | Admit: 2017-10-07 | Discharge: 2017-10-07 | Disposition: A | Discharge status: Home / Self Care | Payer: Medicare Other | Source: Ambulatory Visit | Attending: Emergency Medicine | Admitting: Emergency Medicine

## 2017-10-07 ENCOUNTER — Ambulatory Visit (HOSPITAL_COMMUNITY): Payer: Medicare Other | Admitting: Anesthesiology

## 2017-10-07 ENCOUNTER — Encounter (HOSPITAL_COMMUNITY)
Admission: RE | Disposition: A | Discharge status: Home / Self Care | Payer: Self-pay | Source: Ambulatory Visit | Attending: Emergency Medicine

## 2017-10-07 ENCOUNTER — Encounter (HOSPITAL_COMMUNITY): Payer: Self-pay | Admitting: Urology

## 2017-10-07 ENCOUNTER — Ambulatory Visit (HOSPITAL_COMMUNITY): Payer: Medicare Other

## 2017-10-07 DIAGNOSIS — Z9981 Dependence on supplemental oxygen: Secondary | ICD-10-CM | POA: Diagnosis not present

## 2017-10-07 DIAGNOSIS — I129 Hypertensive chronic kidney disease with stage 1 through stage 4 chronic kidney disease, or unspecified chronic kidney disease: Secondary | ICD-10-CM | POA: Insufficient documentation

## 2017-10-07 DIAGNOSIS — Z87891 Personal history of nicotine dependence: Secondary | ICD-10-CM | POA: Diagnosis not present

## 2017-10-07 DIAGNOSIS — R918 Other nonspecific abnormal finding of lung field: Secondary | ICD-10-CM

## 2017-10-07 DIAGNOSIS — Z79899 Other long term (current) drug therapy: Secondary | ICD-10-CM | POA: Insufficient documentation

## 2017-10-07 DIAGNOSIS — Q794 Prune belly syndrome: Secondary | ICD-10-CM | POA: Diagnosis not present

## 2017-10-07 DIAGNOSIS — Z6832 Body mass index (BMI) 32.0-32.9, adult: Secondary | ICD-10-CM | POA: Diagnosis not present

## 2017-10-07 DIAGNOSIS — N189 Chronic kidney disease, unspecified: Secondary | ICD-10-CM | POA: Diagnosis not present

## 2017-10-07 DIAGNOSIS — D631 Anemia in chronic kidney disease: Secondary | ICD-10-CM | POA: Insufficient documentation

## 2017-10-07 DIAGNOSIS — N183 Chronic kidney disease, stage 3 (moderate): Secondary | ICD-10-CM | POA: Diagnosis not present

## 2017-10-07 DIAGNOSIS — K219 Gastro-esophageal reflux disease without esophagitis: Secondary | ICD-10-CM | POA: Insufficient documentation

## 2017-10-07 DIAGNOSIS — J449 Chronic obstructive pulmonary disease, unspecified: Secondary | ICD-10-CM | POA: Insufficient documentation

## 2017-10-07 DIAGNOSIS — Z794 Long term (current) use of insulin: Secondary | ICD-10-CM | POA: Insufficient documentation

## 2017-10-07 DIAGNOSIS — E1122 Type 2 diabetes mellitus with diabetic chronic kidney disease: Secondary | ICD-10-CM | POA: Insufficient documentation

## 2017-10-07 DIAGNOSIS — R591 Generalized enlarged lymph nodes: Secondary | ICD-10-CM | POA: Diagnosis not present

## 2017-10-07 DIAGNOSIS — R590 Localized enlarged lymph nodes: Secondary | ICD-10-CM | POA: Diagnosis not present

## 2017-10-07 DIAGNOSIS — Z9889 Other specified postprocedural states: Secondary | ICD-10-CM

## 2017-10-07 DIAGNOSIS — J60 Coalworker's pneumoconiosis: Secondary | ICD-10-CM | POA: Diagnosis not present

## 2017-10-07 DIAGNOSIS — E662 Morbid (severe) obesity with alveolar hypoventilation: Secondary | ICD-10-CM | POA: Insufficient documentation

## 2017-10-07 DIAGNOSIS — Z419 Encounter for procedure for purposes other than remedying health state, unspecified: Secondary | ICD-10-CM

## 2017-10-07 DIAGNOSIS — Z7982 Long term (current) use of aspirin: Secondary | ICD-10-CM | POA: Diagnosis not present

## 2017-10-07 HISTORY — DX: Unspecified glaucoma: H40.9

## 2017-10-07 HISTORY — PX: VIDEO BRONCHOSCOPY WITH ENDOBRONCHIAL ULTRASOUND: SHX6177

## 2017-10-07 HISTORY — DX: Cardiac murmur, unspecified: R01.1

## 2017-10-07 HISTORY — PX: VIDEO BRONCHOSCOPY WITH ENDOBRONCHIAL NAVIGATION: SHX6175

## 2017-10-07 HISTORY — DX: Sleep apnea, unspecified: G47.30

## 2017-10-07 HISTORY — DX: Other nonspecific abnormal finding of lung field: R91.8

## 2017-10-07 LAB — PROTIME-INR
INR: 1.06
Prothrombin Time: 13.7 seconds (ref 11.4–15.2)

## 2017-10-07 LAB — COMPREHENSIVE METABOLIC PANEL
ALBUMIN: 3.2 g/dL — AB (ref 3.5–5.0)
ALT: 31 U/L (ref 17–63)
AST: 25 U/L (ref 15–41)
Alkaline Phosphatase: 89 U/L (ref 38–126)
Anion gap: 12 (ref 5–15)
BUN: 49 mg/dL — AB (ref 6–20)
CO2: 21 mmol/L — AB (ref 22–32)
CREATININE: 1.93 mg/dL — AB (ref 0.61–1.24)
Calcium: 8.3 mg/dL — ABNORMAL LOW (ref 8.9–10.3)
Chloride: 104 mmol/L (ref 101–111)
GFR calc Af Amer: 37 mL/min — ABNORMAL LOW (ref >60)
GFR calc non Af Amer: 32 mL/min — ABNORMAL LOW (ref >60)
Glucose, Bld: 84 mg/dL (ref 65–99)
POTASSIUM: 4.5 mmol/L (ref 3.5–5.1)
SODIUM: 137 mmol/L (ref 135–145)
Total Bilirubin: 0.3 mg/dL (ref 0.3–1.2)
Total Protein: 6.7 g/dL (ref 6.5–8.1)

## 2017-10-07 LAB — CBC
HEMATOCRIT: 39.7 % (ref 39.0–52.0)
Hemoglobin: 12.9 g/dL — ABNORMAL LOW (ref 13.0–17.0)
MCH: 31.5 pg (ref 26.0–34.0)
MCHC: 32.5 g/dL (ref 30.0–36.0)
MCV: 97.1 fL (ref 78.0–100.0)
PLATELETS: 146 10*3/uL — AB (ref 150–400)
RBC: 4.09 MIL/uL — ABNORMAL LOW (ref 4.22–5.81)
RDW: 14.8 % (ref 11.5–15.5)
WBC: 9 10*3/uL (ref 4.0–10.5)

## 2017-10-07 LAB — GLUCOSE, CAPILLARY
GLUCOSE-CAPILLARY: 82 mg/dL (ref 65–99)
GLUCOSE-CAPILLARY: 101 mg/dL — AB (ref 65–99)

## 2017-10-07 LAB — APTT: aPTT: 32 seconds (ref 24–36)

## 2017-10-07 SURGERY — VIDEO BRONCHOSCOPY WITH ENDOBRONCHIAL NAVIGATION
Anesthesia: General

## 2017-10-07 MED ORDER — INSULIN GLARGINE 100 UNIT/ML ~~LOC~~ SOLN: 36.0000 [IU] | Freq: Every day | SUBCUTANEOUS | Status: DC

## 2017-10-07 MED ORDER — EPHEDRINE SULFATE 50 MG/ML IJ SOLN
INTRAMUSCULAR | Status: DC | PRN
  Administered 2017-10-07: 10 mg via INTRAVENOUS

## 2017-10-07 MED ORDER — ONDANSETRON HCL 4 MG/2ML IJ SOLN: INTRAMUSCULAR | Status: DC | PRN

## 2017-10-07 MED ORDER — LIDOCAINE 2% (20 MG/ML) 5 ML SYRINGE
INTRAMUSCULAR | Status: DC | PRN
  Administered 2017-10-07: 60 mg via INTRAVENOUS

## 2017-10-07 MED ORDER — GLYCOPYRROLATE 0.2 MG/ML IJ SOLN
INTRAMUSCULAR | Status: DC | PRN
  Administered 2017-10-07: 0.2 mg via INTRAVENOUS
  Administered 2017-10-07: 0.6 mg via INTRAVENOUS

## 2017-10-07 MED ORDER — ONDANSETRON HCL 4 MG/2ML IJ SOLN
INTRAMUSCULAR | Status: DC | PRN
  Administered 2017-10-07: 4 mg via INTRAVENOUS

## 2017-10-07 MED ORDER — DEXAMETHASONE SODIUM PHOSPHATE 10 MG/ML IJ SOLN
INTRAMUSCULAR | Status: DC | PRN
  Administered 2017-10-07: 10 mg via INTRAVENOUS

## 2017-10-07 MED ORDER — 0.9 % SODIUM CHLORIDE (POUR BTL) OPTIME
TOPICAL | Status: DC | PRN
  Administered 2017-10-07: 1000 mL

## 2017-10-07 MED ORDER — PHENYLEPHRINE HCL 10 MG/ML IJ SOLN
INTRAMUSCULAR | Status: DC | PRN
  Administered 2017-10-07 (×2): 80 ug via INTRAVENOUS

## 2017-10-07 MED ORDER — NEOSTIGMINE METHYLSULFATE 10 MG/10ML IV SOLN
INTRAVENOUS | Status: DC | PRN
  Administered 2017-10-07: 4 mg via INTRAVENOUS

## 2017-10-07 MED ORDER — PROPOFOL 10 MG/ML IV BOLUS
INTRAVENOUS | Status: AC
  Filled 2017-10-07: qty 20

## 2017-10-07 MED ORDER — LOSARTAN POTASSIUM 50 MG PO TABS: 12.5000 mg | ORAL_TABLET | Freq: Every day | ORAL | Status: DC

## 2017-10-07 MED ORDER — ONDANSETRON HCL 4 MG/2ML IJ SOLN: 4.0000 mg | Freq: Once | INTRAMUSCULAR | Status: DC | PRN

## 2017-10-07 MED ORDER — MEPERIDINE HCL 50 MG/ML IJ SOLN: 6.2500 mg | INTRAMUSCULAR | Status: DC | PRN

## 2017-10-07 MED ORDER — PROPOFOL 10 MG/ML IV BOLUS
INTRAVENOUS | Status: DC | PRN
  Administered 2017-10-07: 100 mg via INTRAVENOUS

## 2017-10-07 MED ORDER — PHENYLEPHRINE HCL 10 MG/ML IJ SOLN
INTRAVENOUS | Status: DC | PRN
  Administered 2017-10-07: 25 ug/min via INTRAVENOUS

## 2017-10-07 MED ORDER — FENTANYL CITRATE (PF) 250 MCG/5ML IJ SOLN
INTRAMUSCULAR | Status: AC
  Filled 2017-10-07: qty 5

## 2017-10-07 MED ORDER — FENTANYL CITRATE (PF) 100 MCG/2ML IJ SOLN: 25.0000 ug | INTRAMUSCULAR | Status: DC | PRN

## 2017-10-07 MED ORDER — ROCURONIUM BROMIDE 10 MG/ML (PF) SYRINGE
PREFILLED_SYRINGE | INTRAVENOUS | Status: DC | PRN
  Administered 2017-10-07: 50 mg via INTRAVENOUS

## 2017-10-07 MED ORDER — LACTATED RINGERS IV SOLN: INTRAVENOUS | Status: DC | PRN

## 2017-10-07 MED ORDER — TORSEMIDE 100 MG PO TABS: 16.0000 mg | ORAL_TABLET | Freq: Every day | ORAL | Status: DC

## 2017-10-07 MED ORDER — SODIUM CHLORIDE 0.9 % IV SOLN
INTRAVENOUS | Status: DC
  Administered 2017-10-07 (×3): via INTRAVENOUS

## 2017-10-07 MED ORDER — FENTANYL CITRATE (PF) 250 MCG/5ML IJ SOLN
INTRAMUSCULAR | Status: DC | PRN
  Administered 2017-10-07: 100 ug via INTRAVENOUS

## 2017-10-07 SURGICAL SUPPLY — 47 items
ADAPTER BRONCH F/PENTAX (ADAPTER) ×3 IMPLANT
BRUSH CYTOL CELLEBRITY 1.5X140 (MISCELLANEOUS) ×3 IMPLANT
BRUSH SUPERTRAX BIOPSY (INSTRUMENTS) IMPLANT
BRUSH SUPERTRAX NDL-TIP CYTO (INSTRUMENTS) ×6 IMPLANT
CANISTER SUCT 3000ML PPV (MISCELLANEOUS) ×3 IMPLANT
CHANNEL WORK EXTEND EDGE 180 (KITS) IMPLANT
CHANNEL WORK EXTEND EDGE 45 (KITS) IMPLANT
CHANNEL WORK EXTEND EDGE 90 (KITS) IMPLANT
CONT SPEC 4OZ CLIKSEAL STRL BL (MISCELLANEOUS) ×3 IMPLANT
COVER BACK TABLE 60X90IN (DRAPES) ×3 IMPLANT
COVER DOME SNAP 22 D (MISCELLANEOUS) ×3 IMPLANT
FILTER STRAW FLUID ASPIR (MISCELLANEOUS) IMPLANT
FORCEPS BIOP RJ4 1.8 (CUTTING FORCEPS) IMPLANT
FORCEPS BIOP SUPERTRX PREMAR (INSTRUMENTS) ×3 IMPLANT
GAUZE SPONGE 4X4 12PLY STRL (GAUZE/BANDAGES/DRESSINGS) ×3 IMPLANT
GLOVE BIO SURGEON STRL SZ7.5 (GLOVE) ×9 IMPLANT
GLOVE SURG SS PI 7.0 STRL IVOR (GLOVE) ×3 IMPLANT
GOWN STRL REUS W/ TWL LRG LVL3 (GOWN DISPOSABLE) ×2 IMPLANT
GOWN STRL REUS W/ TWL XL LVL3 (GOWN DISPOSABLE) ×1 IMPLANT
GOWN STRL REUS W/TWL LRG LVL3 (GOWN DISPOSABLE) ×4
GOWN STRL REUS W/TWL XL LVL3 (GOWN DISPOSABLE) ×2
KIT CLEAN ENDO COMPLIANCE (KITS) ×6 IMPLANT
KIT LOCATABLE GUIDE (CANNULA) IMPLANT
KIT MARKER FIDUCIAL DELIVERY (KITS) IMPLANT
KIT PROCEDURE EDGE 180 (KITS) ×3 IMPLANT
KIT PROCEDURE EDGE 45 (KITS) IMPLANT
KIT PROCEDURE EDGE 90 (KITS) IMPLANT
KIT ROOM TURNOVER OR (KITS) ×3 IMPLANT
MARKER SKIN DUAL TIP RULER LAB (MISCELLANEOUS) ×3 IMPLANT
NEEDLE EBUS SONO TIP PENTAX (NEEDLE) ×3 IMPLANT
NEEDLE SUPERTRX PREMARK BIOPSY (NEEDLE) ×3 IMPLANT
NS IRRIG 1000ML POUR BTL (IV SOLUTION) ×3 IMPLANT
OIL SILICONE PENTAX (PARTS (SERVICE/REPAIRS)) ×3 IMPLANT
PAD ARMBOARD 7.5X6 YLW CONV (MISCELLANEOUS) ×6 IMPLANT
PATCHES PATIENT (LABEL) ×9 IMPLANT
SYR 20CC LL (SYRINGE) ×6 IMPLANT
SYR 20ML ECCENTRIC (SYRINGE) ×6 IMPLANT
SYR 30ML LL (SYRINGE) ×3 IMPLANT
SYR 50ML SLIP (SYRINGE) ×3 IMPLANT
SYR 5ML LUER SLIP (SYRINGE) ×3 IMPLANT
TOWEL OR 17X24 6PK STRL BLUE (TOWEL DISPOSABLE) ×3 IMPLANT
TRAP SPECIMEN MUCOUS 40CC (MISCELLANEOUS) IMPLANT
TUBE CONNECTING 20'X1/4 (TUBING) ×2
TUBE CONNECTING 20X1/4 (TUBING) ×4 IMPLANT
UNDERPAD 30X30 (UNDERPADS AND DIAPERS) ×3 IMPLANT
VALVE DISPOSABLE (MISCELLANEOUS) ×3 IMPLANT
WATER STERILE IRR 1000ML POUR (IV SOLUTION) ×3 IMPLANT

## 2017-10-07 NOTE — Op Note (Signed)
Video Bronchoscopy with Electromagnetic Navigation and Endobronchial Ultrasound Procedure Note  Date of Operation: 10/07/2017  Pre-op Diagnosis: Right middle lobe nodule, left lower lobe nodule, mediastinal lymphadenopathy  Post-op Diagnosis: Same  Surgeon: Baltazar Apo  Assistants: None  Anesthesia: General endotracheal anesthesia  Operation: Flexible video fiberoptic bronchoscopy with electromagnetic navigation and biopsies.  Estimated Blood Loss: Minimal  Complications: None apparent  Indications and History: Lucas Price is a 77 y.o. male with hx hypermetabolic right middle lobe and left lower lobe nodules.  Recommendation was made to seek tissue diagnosis via navigational bronchoscopy as well as mediastinal nodal sampling via endobronchial ultrasound.  The risks, benefits, complications, treatment options and expected outcomes were discussed with the patient.  The possibilities of pneumothorax, pneumonia, reaction to medication, pulmonary aspiration, perforation of a viscus, bleeding, failure to diagnose a condition and creating a complication requiring transfusion or operation were discussed with the patient who freely signed the consent.    Description of Procedure: The patient was seen in the Preoperative Area, was examined and was deemed appropriate to proceed.  The patient was taken to OR 10, identified as Lucas Price and the procedure verified as Flexible Video Fiberoptic Bronchoscopy.  A Time Out was held and the above information confirmed.   Prior to the date of the procedure a high-resolution CT scan of the chest was performed. Utilizing [place] a virtual tracheobronchial tree was generated to allow the creation of distinct navigation pathways to the patient's parenchymal abnormalities. After being taken to the operating room general anesthesia was initiated and the patient  was orally intubated. The video fiberoptic bronchoscope was introduced via the  endotracheal tube and a general inspection was performed which showed normal airways throughout.  There were no endobronchial lesions or abnormal secretions seen. The extendable working channel and locator guide were introduced into the bronchoscope. The distinct navigation pathways prepared prior to this procedure were then utilized to navigate to within 0.5 cm of patient's lesions identified on CT scan. The extendable working channel was secured into place and the locator guide was withdrawn. Under fluoroscopic guidance transbronchial needle brushings, transbronchial Wang needle biopsies, and transbronchial forceps biopsies were performed at each location (right middle lobe, left lower lobe) to be sent for cytology and pathology. A bronchioalveolar lavage was performed in the right middle lobe and then again in the left lower lobe and sent for cytology and microbiology (bacterial, fungal, AFB smears and cultures).   The standard scope was then withdrawn and the endobronchial ultrasound was used to identify and characterize the peritracheal, hilar and bronchial lymph nodes. Inspection showed a small 1 cm 4L node, an enlarged 4R node and several enlarged 7 nodes. Using real-time ultrasound guidance Wang needle biopsies were take from Station 4L, 4R, 7 nodes and were sent for cytology  At the end of the procedure a general airway inspection was performed and there was no evidence of active bleeding. The bronchoscope was removed.  The patient tolerated the procedure well. There was no significant blood loss and there were no obvious complications. A post-procedural chest x-ray is pending.  Samples: 1. Transbronchial needle brushings from right middle lobe nodule, left lower lobe nodule 2. Transbronchial Wang needle biopsies from right middle lobe nodule, left lower lobe nodule 3. Transbronchial forceps biopsies from right middle lobe nodule, left lower lobe nodule 4. Bronchoalveolar lavage from right middle  lobe and left lower lobe 5. Wang needle biopsies from 4L node 6. Wang needle biopsies from 4R node 7.  Wang needle biopsies from 7 node   Plans:  The patient will be discharged from the PACU to home when recovered from anesthesia and after chest x-ray is reviewed. We will review the cytology, pathology and microbiology results with the patient when they become available. Outpatient followup will be with Dr Lamonte Sakai.    Baltazar Apo, MD, PhD 10/07/2017, 12:32 PM  P[facility] Care [phone] or if no answer [phone] .

## 2017-10-07 NOTE — Progress Notes (Signed)
Dr Royce Macadamia in and oked pt to go home with oxygen

## 2017-10-07 NOTE — Anesthesia Procedure Notes (Signed)
Procedure Name: Intubation Date/Time: 10/07/2017 9:51 AM Performed by: Clearnce Sorrel, CRNA Pre-anesthesia Checklist: Patient identified, Emergency Drugs available, Suction available, Patient being monitored and Timeout performed Patient Re-evaluated:Patient Re-evaluated prior to induction Oxygen Delivery Method: Circle system utilized Preoxygenation: Pre-oxygenation with 100% oxygen Induction Type: IV induction Ventilation: Mask ventilation without difficulty Laryngoscope Size: Mac, 4 and Glidescope Grade View: Grade III Tube type: Oral Tube size: 8.5 mm Number of attempts: 2 Airway Equipment and Method: Stylet Placement Confirmation: ETT inserted through vocal cords under direct vision,  positive ETCO2 and breath sounds checked- equal and bilateral Secured at: 22 cm Tube secured with: Tape Dental Injury: Teeth and Oropharynx as per pre-operative assessment

## 2017-10-07 NOTE — Interval H&P Note (Signed)
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  This did not clear after empiric treatment with levofloxacin.  He reports no new issues.  He has been having some mild nonproductive cough.  There is been no pain or dyspnea.   Vitals:   10/07/17 0748  BP: 119/70  Pulse: 87  Temp: 98.6 F (37 C)  SpO2: 96%   Gen: Pleasant, obese gentleman, in no distress,  normal affect  ENT: No lesions,  mouth clear,  oropharynx clear, no postnasal drip, M4 airway  Neck: No JVD, no stridor  Lungs: No use of accessory muscles, decreased at bases but otherwise completely clear  Cardiovascular: RRR, heart sounds normal, no murmur or gallops, no peripheral edema  Abdomen: Prune-belly, positive bowel sounds  Musculoskeletal: No deformities, no cyanosis or clubbing  Neuro: alert, awake, interacting, appropriate, nonfocal  Skin: Warm, no lesions or rashes   CT chest 09/18/17: COMPARISON:  PET-CT dated 08/03/2017.  CT chest dated 07/22/2017.  FINDINGS: Cardiovascular: Cardiomegaly.  No pericardial effusion.  Ectasia of the ascending thoracic aorta, measuring 3.5 cm. Atherosclerotic calcifications of the aortic arch.  Three vessel coronary atherosclerosis.  Mediastinum/Nodes: 10 mm low right paratracheal node. Additional small mediastinal lymph nodes.  Thyroid is mildly heterogeneous.  Lungs/Pleura: 2.0 x 1.3 cm medial left lower lobe nodule along the posterior left perihilar region (series 2/image 93), hypermetabolic on prior PET, suspicious for primary bronchogenic neoplasm. This is grossly unchanged from the prior studies.  Additional 1.4 x 2.0 cm central right middle lobe nodule (series 3/image 100), also grossly unchanged.  6 mm nodule in the medial right lower lobe (series 3/image 108).  Left lower lobe bronchiectasis with patchy opacity at the left lung base (series 3/image  124).  Calcified granuloma in the anterior left upper lobe (series 3/image 36). Additional calcified granuloma in the lateral left upper lobe (series 3/image 56).  Mild patchy/nodular opacity in the medial right lower lobe (series 3/image 122).  No focal consolidation.  No pleural effusion or pneumothorax.  Upper Abdomen: Visualized upper abdomen is grossly unremarkable.  Musculoskeletal: Degenerative changes of the visualized thoracolumbar spine.  IMPRESSION: 2.0 x 1.3 cm medial left lower lobe nodule along the posterior left perihilar region, grossly unchanged.  1.4 x 2.0 cm central right middle lobe nodule, unchanged.  Aortic Atherosclerosis (ICD10-I70.0).   Assessment/plan: We will proceed with navigational bronchoscopy to his right middle lobe and left lower lobe nodules.  Also we will perform endobronchial ultrasound to inspect identify mediastinal lymphadenopathy.  Performed nodal biopsies if possible.  Procedure explained to the patient in full including risks and benefits.  He understands and agrees to proceed.  All questions answered.  No contraindications noted.  Baltazar Apo, MD, PhD 10/07/2017, 8:15 AM Lucas Price [phone] or if no answer [phone]

## 2017-10-07 NOTE — Progress Notes (Signed)
Pt wears 1.5 liters of oxygen at home prn, Dr Lamonte Sakai ok with pt going home on it

## 2017-10-07 NOTE — Anesthesia Preprocedure Evaluation (Addendum)
Anesthesia Evaluation  Patient identified by MRN, date of birth, ID band Patient awake    Reviewed: Allergy & Precautions, NPO status , Patient's Chart, lab work & pertinent test results, reviewed documented beta blocker date and time   Airway Mallampati: III  TM Distance: >3 FB Neck ROM: Full    Dental no notable dental hx. (+) Teeth Intact, Caps   Pulmonary sleep apnea , COPD,  COPD inhaler and oxygen dependent, Current Smoker,  On home O2   Pulmonary exam normal breath sounds clear to auscultation       Cardiovascular hypertension, Pt. on medications and Pt. on home beta blockers Normal cardiovascular exam+ Valvular Problems/Murmurs  Rhythm:Regular Rate:Normal     Neuro/Psych  Neuromuscular disease negative psych ROS   GI/Hepatic Neg liver ROS, GERD  Medicated and Controlled,  Endo/Other  diabetes, Poorly Controlled, Type 2, Insulin DependentObesity  Renal/GU Renal InsufficiencyRenal disease   Bladder neck obstruction    Musculoskeletal Prune belly syndrome   Abdominal (+) + obese,   Peds  Hematology  (+) anemia ,   Anesthesia Other Findings   Reproductive/Obstetrics                            Anesthesia Physical Anesthesia Plan  ASA: III  Anesthesia Plan: General   Post-op Pain Management:    Induction: Intravenous  PONV Risk Score and Plan: 2 and Treatment may vary due to age or medical condition, Ondansetron and Midazolam  Airway Management Planned: Oral ETT  Additional Equipment:   Intra-op Plan:   Post-operative Plan: Extubation in OR  Informed Consent: I have reviewed the patients History and Physical, chart, labs and discussed the procedure including the risks, benefits and alternatives for the proposed anesthesia with the patient or authorized representative who has indicated his/her understanding and acceptance.   Dental advisory given  Plan Discussed with: CRNA,  Anesthesiologist and Surgeon  Anesthesia Plan Comments:         Anesthesia Quick Evaluation

## 2017-10-07 NOTE — Anesthesia Postprocedure Evaluation (Signed)
Anesthesia Post Note  Patient: Doran Heater  Procedure(s) Performed: VIDEO BRONCHOSCOPY WITH ENDOBRONCHIAL NAVIGATION (N/A ) VIDEO BRONCHOSCOPY WITH ENDOBRONCHIAL ULTRASOUND (N/A )     Patient location during evaluation: PACU Anesthesia Type: General Level of consciousness: awake and alert and sedated Pain management: pain level controlled Vital Signs Assessment: post-procedure vital signs reviewed and stable Respiratory status: spontaneous breathing, nonlabored ventilation, respiratory function stable and patient connected to nasal cannula oxygen Cardiovascular status: blood pressure returned to baseline and stable Postop Assessment: no apparent nausea or vomiting Anesthetic complications: no Comments: Ok to D/C with O2. Patient has O2 tank in car.    Last Vitals:  Vitals:   10/07/17 1325 10/07/17 1345  BP:    Pulse: 62 (!) 55  Resp: 19 16  Temp:    SpO2: 92% 96%    Last Pain: There were no vitals filed for this visit.               Lexey Fletes A.

## 2017-10-07 NOTE — Transfer of Care (Signed)
Immediate Anesthesia Transfer of Care Note  Patient: Lucas Price  Procedure(s) Performed: VIDEO BRONCHOSCOPY WITH ENDOBRONCHIAL NAVIGATION (N/A ) VIDEO BRONCHOSCOPY WITH ENDOBRONCHIAL ULTRASOUND (N/A )  Patient Location: PACU  Anesthesia Type:General  Level of Consciousness: awake, alert  and oriented  Airway & Oxygen Therapy: Patient Spontanous Breathing and Patient connected to nasal cannula oxygen  Post-op Assessment: Report given to RN and Post -op Vital signs reviewed and stable  Post vital signs: Reviewed and stable  Last Vitals:  Vitals:   10/07/17 0748 10/07/17 1248  BP: 119/70 (!) 92/45  Pulse: 87 69  Resp:  16  Temp: 37 C 36.5 C  SpO2: 96% 93%    Last Pain: There were no vitals filed for this visit.    Patients Stated Pain Goal: 3 (70/22/02 6691)  Complications: No apparent anesthesia complications

## 2017-10-07 NOTE — Discharge Instructions (Signed)
Flexible Bronchoscopy, Care After These instructions give you information on caring for yourself after your procedure. Your doctor may also give you more specific instructions. Call your doctor if you have any problems or questions after your procedure. Follow these instructions at home:  Do not eat or drink anything for 2 hours after your procedure. If you try to eat or drink before the medicine wears off, food or drink could go into your lungs. You could also burn yourself.  After 2 hours have passed and when you can cough and gag normally, you may eat soft food and drink liquids slowly.  The day after the test, you may eat your normal diet.  You may do your normal activities.  Keep all doctor visits. Get help right away if:  You get more and more short of breath.  You get light-headed.  You feel like you are going to pass out (faint).  You have chest pain.  You have new problems that worry you.  You cough up more than a little blood.  You cough up more blood than before. This information is not intended to replace advice given to you by your health care provider. Make sure you discuss any questions you have with your health care provider. Document Released: 05/25/2009 Document Revised: 01/03/2016 Document Reviewed: 04/01/2013 Elsevier Interactive Patient Education  2017 [place].  Please call our office for any questions or concerns.  [phone].

## 2017-10-08 ENCOUNTER — Encounter: Payer: Self-pay | Admitting: Emergency Medicine

## 2017-10-08 ENCOUNTER — Ambulatory Visit: Payer: PRIVATE HEALTH INSURANCE | Admitting: Emergency Medicine

## 2017-10-08 ENCOUNTER — Encounter (HOSPITAL_COMMUNITY): Payer: Self-pay | Admitting: Emergency Medicine

## 2017-10-08 LAB — ACID FAST SMEAR (AFB): ACID FAST SMEAR - AFSCU2: NEGATIVE

## 2017-10-08 LAB — ACID FAST SMEAR (AFB, MYCOBACTERIA): Acid Fast Smear: NEGATIVE

## 2017-10-09 ENCOUNTER — Telehealth: Payer: Self-pay | Admitting: Emergency Medicine

## 2017-10-09 LAB — CULTURE, RESPIRATORY W GRAM STAIN
Culture: NO GROWTH
Culture: NO GROWTH

## 2017-10-09 LAB — CULTURE, RESPIRATORY

## 2017-10-09 NOTE — Telephone Encounter (Signed)
RB please advise on bronch results.

## 2017-10-09 NOTE — Telephone Encounter (Signed)
I reviewed the results with the patient. All samples from RML, LLL, nodes 4L, 4R, 7 are all negative for malignancy.   We will need to follow with imaging to insure no interval change, indication for re-biopsy.

## 2017-10-09 NOTE — Telephone Encounter (Signed)
RB please advise on bronch results. Thanks!

## 2017-10-09 NOTE — Telephone Encounter (Signed)
I have reviewed the results with Dr Doyne Keel

## 2017-10-16 ENCOUNTER — Encounter: Payer: Self-pay | Admitting: Emergency Medicine

## 2017-10-16 ENCOUNTER — Ambulatory Visit (INDEPENDENT_AMBULATORY_CARE_PROVIDER_SITE_OTHER): Payer: Medicare Other | Admitting: Emergency Medicine

## 2017-10-16 ENCOUNTER — Telehealth: Payer: Self-pay | Admitting: Family Medicine

## 2017-10-16 DIAGNOSIS — R918 Other nonspecific abnormal finding of lung field: Secondary | ICD-10-CM | POA: Diagnosis not present

## 2017-10-16 NOTE — Assessment & Plan Note (Signed)
Suspicious for malignancy but biopsies have all been negative. We will plan to follow radiographically and clinically   We will plan to repeat CT scan chest in 03/2018.  Please call our office for any changes in your breathing, new cough, chest discomfort. Any of these problems would prompt [place] to repeat your chest imaging sooner.  We will follow your bronchoscopy culture information to completion.  We will defer any lab work today, but we may decide to check ANCA, ACE level, RF, etc. at some point in the future as we continue your workup.  Follow with Dr Lamonte Sakai in August after your CT scan to review together.

## 2017-10-16 NOTE — Telephone Encounter (Signed)
Copied from [place] [phone]. Topic: General - Other >> Oct 16, 2017 11:20 AM Synthia Innocent wrote: Reason for CRM: requesting handicap placard. Please advise

## 2017-10-16 NOTE — Progress Notes (Signed)
Subjective:    Patient ID: Lucas Price, male    DOB: 08/23/1940, 77 y.o.   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 He is on Spiriva and Symbicort. He has been followed by Dr. Chase Caller in our office for this and also for possible OSA/obesity hypoventilation syndrome, on BiPAP at night. He had the BCG vaccine.   He underwent a CT scan of his chest 07/22/17 to evaluate for interstitial lung disease.  I have reviewed all the radiology.  The CT scan showed 2 predominant nodular areas, one in the inferior right middle lobe 1.5 x 2.4 cm, another in the medial aspect of the left lower lobe 2.1 x 1.7 cm in the hilar region.  There is some associated narrowing and calcification of the adjacent medial left lower lobe airways.  A PET scan done on 08/03/17 showed that the medial left lower lobe lesion is significantly hypermetabolic.  There is mild hypermetabolism in the right middle lobe nodule as well.  He is referred today to discuss further workup.  He reports some dry cough, increased over last 4 months. No sputum, no hemoptysis. He has some decreased appetite, has lost about 30 lbs over 6 months.   ROV 09/30/17 --this is a follow-up visit for patient with a minimal smoking history, history of OSA and obesity hypoventilation syndrome on BiPAP.  I am following him for an abnormal CT scan of his chest that shows 2 nodular areas: Inferior right middle lobe and medial aspect of the left lower lobe in the perihilar region.  The medial left lower lobe lesion is hypermetabolic by PET scan 70/17/79.  I treated him empirically with Levaquin to see if the areas were changed. Developed thrush, took it for 5 days. His QuantiFERON gold was negative.  Repeat CT scan of the chest done on 09/18/17 was reviewed by me.  This shows that both  nodules persist, are unchanged in size or appearance.  He returns today to discuss next steps.  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 all cytology and pathology is negative. AFB and fungal smears negative. 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    Review of Systems  Past Medical History:  Diagnosis Date  . Anemia 05/26/2014  . Bladder neck obstruction 04/24/2014  . CKD (chronic kidney disease) 05/26/2014  . COPD (chronic obstructive pulmonary disease) ([place]) 05/26/2014  . Diabetes mellitus ([place]) 05/26/2014  . Former smoker 05/26/2014  . GERD (gastroesophageal reflux disease) 05/26/2014  . Glaucoma   . Heart murmur    mild  . HTN (hypertension) 05/26/2014  . Lung nodules   . 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 Recurrent urinary tract infection 09/05/2013  . Renal calculus 03/19/2015  . Retention of urine 09/05/2013  . Sleep apnea    wears Bipap     Family History  Problem Relation Age of Onset  . Heart disease Mother   . Diabetes Mother      Social History   Socioeconomic History  . Marital status: Married    Spouse name: Not on file  . Number of children: Not on file  . Years of education: Not on file  . Highest education level: Not  on file  Social Needs  . Financial resource strain: Not on file  . Food insecurity - worry: Not on file  . Food insecurity - inability: Not on file  . Transportation needs - medical: Not on file  . Transportation needs - non-medical: Not on file  Occupational History  . Not on file  Tobacco Use  . Smoking status: Current Some Day Smoker    Years: 2.00    Types: Cigarettes  . Smokeless tobacco: Former Systems developer    Types: Chew  . Tobacco comment: smokes 4-5 every other day  Substance and Sexual Activity  . Alcohol use: No  . Drug use: No  . Sexual activity: Yes     Partners: Female  Other Topics Concern  . Not on file  Social History Narrative  . 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   No Known Allergies   Outpatient Medications Prior to Visit  Medication Sig Dispense Refill  . allopurinol (ZYLOPRIM) 100 MG tablet Take 1 tablet (100 mg total) daily by mouth. 90 tablet 1  . aspirin EC 81 MG tablet Take 81 mg by mouth daily.     . budesonide-formoterol (SYMBICORT) 160-4.5 MCG/ACT inhaler Inhale 2 puffs into the lungs 2 (two) times daily.    . calcium carbonate (CALCIUM 600) 600 MG TABS tablet Take 600 mg by mouth daily.     . Cholecalciferol (D 10000) 10000 units CAPS Take 10,000 Units by mouth 2 (two) times a week.     . Coenzyme Q-10 200 MG CAPS Take 200 mg by mouth daily.     . Continuous Blood Gluc Sensor ([place]) MISC Per instructions. 1 each 2  . glimepiride (AMARYL) 4 MG tablet Take 1 tablet (4 mg total) by mouth daily with breakfast. 90 tablet 1  . guaiFENesin (MUCINEX) 600 MG 12 hr tablet Take 1,200 mg by mouth 2 (two) times daily.    . insulin glargine (LANTUS) 100 UNIT/ML injection Inject 0.36 mLs (36 Units total) into the skin at bedtime.    . Insulin Syringe-Needle U-100 (RELION INSULIN SYRINGE 1ML/31G) 31G X 5/16" 1 ML MISC 1 Syringe by Does not apply route 3 (three) times daily. 300 each 1  . losartan (COZAAR) 50 MG tablet Take 0.5 tablets (25 mg total) by mouth daily.    . metoprolol (TOPROL-XL) 200 MG 24 hr tablet Take 50 mg by mouth daily.     . OXYGEN Inhale 1.5 L into the lungs.    . sitaGLIPtin (JANUVIA) 100 MG tablet Take 50mg  daily    . Syringe, Disposable, 1 ML MISC Use with insulin needle. 100 each 3  . Tiotropium Bromide Monohydrate (SPIRIVA RESPIMAT) 2.5 MCG/ACT AERS Inhale 2 puffs into the lungs daily. 4 g 3  . torsemide (DEMADEX) 100 MG tablet Take 0.5 tablets (50 mg total) by mouth  daily.    . Travoprost, BAK Free, (TRAVATAN Z) 0.004 % SOLN ophthalmic solution Place 1 drop in both eyes once daily     No facility-administered medications prior to visit.          Objective:   Physical Exam Vitals:   10/16/17 1433 10/16/17 1439  BP:  102/60  Pulse:  66  SpO2:  94%  Height: 5\' 3"  (1.6 m)    Gen: Pleasant, well-nourished, in no distress,  normal affect  ENT: No  lesions,  mouth clear,  oropharynx clear, no postnasal drip  Neck: No JVD, no stridor  Lungs: No use of accessory muscles, clear bilaterally  Cardiovascular: RRR, heart sounds normal, no murmur or gallops, no peripheral edema  Musculoskeletal: No deformities, no cyanosis or clubbing  Neuro: alert, non focal  Skin: Warm, no lesions or rash     Assessment & Plan:  Pulmonary nodules Suspicious for malignancy but biopsies have all been negative. We will plan to follow radiographically and clinically   We will plan to repeat CT scan chest in 03/2018.  Please call our office for any changes in your breathing, new cough, chest discomfort. Any of these problems would prompt [place] to repeat your chest imaging sooner.  We will follow your bronchoscopy culture information to completion.  We will defer any lab work today, but we may decide to check ANCA, ACE level, RF, etc. at some point in the future as we continue your workup.  Follow with Dr Lamonte Sakai in August after your CT scan to review together.   Baltazar Apo, MD, PhD 10/16/2017, 3:04 PM [facility] [phone] or if no answer [phone]

## 2017-10-16 NOTE — Patient Instructions (Addendum)
We will plan to repeat CT scan chest in 03/2018.  Please call our office for any changes in your breathing, new cough, chest discomfort. Any of these problems would prompt [place] to repeat your chest imaging sooner.  We will follow your bronchoscopy culture information to completion.  We will defer any lab work today, but we may decide to check ANCA, ACE level, RF, etc. at some point in the future as we continue your workup.  Follow with Dr Lamonte Sakai in August after your CT scan to review together.

## 2017-10-27 ENCOUNTER — Other Ambulatory Visit: Payer: Self-pay | Admitting: *Deleted

## 2017-10-27 DIAGNOSIS — H35352 Cystoid macular degeneration, left eye: Secondary | ICD-10-CM | POA: Diagnosis not present

## 2017-10-27 DIAGNOSIS — H35372 Puckering of macula, left eye: Secondary | ICD-10-CM | POA: Diagnosis not present

## 2017-10-27 DIAGNOSIS — H35072 Retinal telangiectasis, left eye: Secondary | ICD-10-CM | POA: Diagnosis not present

## 2017-10-27 DIAGNOSIS — R911 Solitary pulmonary nodule: Secondary | ICD-10-CM

## 2017-10-27 DIAGNOSIS — E119 Type 2 diabetes mellitus without complications: Secondary | ICD-10-CM | POA: Diagnosis not present

## 2017-10-27 LAB — HM DIABETES EYE EXAM

## 2017-10-27 NOTE — Telephone Encounter (Signed)
I reiveweid patient records - non-diagnostic bronch and negative colonoscopy. Please  A. Have him do a 3 month Ct chest without contrast early may 2019 B. Give him OV with me early may 2019 C. I will address his questions/concerns at the time of that visit   Dr. Brand Males, M.D., [facility].C.P Pulmonary and Critical Care Medicine Staff Physician, [place] Director - Interstitial Lung Disease  Program  Pulmonary [place] at [place], [place], [address]  Pager: [phone], If no answer or between  15:00h - 7:00h: call [phone] Telephone: [phone]

## 2017-10-27 NOTE — Telephone Encounter (Signed)
Was able to talk to the patient regarding their results.  They verbalized an understanding of what was discussed. No further questions at this time.   Follow up appointment made for Dec 31 2017.   CT order was put in. Nothing further needed

## 2017-11-03 ENCOUNTER — Encounter: Payer: Self-pay | Admitting: Family Medicine

## 2017-11-03 ENCOUNTER — Ambulatory Visit (INDEPENDENT_AMBULATORY_CARE_PROVIDER_SITE_OTHER): Payer: Medicare Other | Admitting: Family Medicine

## 2017-11-03 VITALS — BP 112/68 | HR 75 | Temp 97.5°F | Ht 63.0 in | Wt 182.8 lb

## 2017-11-03 DIAGNOSIS — J449 Chronic obstructive pulmonary disease, unspecified: Secondary | ICD-10-CM | POA: Diagnosis not present

## 2017-11-03 DIAGNOSIS — Z794 Long term (current) use of insulin: Secondary | ICD-10-CM

## 2017-11-03 DIAGNOSIS — R918 Other nonspecific abnormal finding of lung field: Secondary | ICD-10-CM

## 2017-11-03 DIAGNOSIS — I129 Hypertensive chronic kidney disease with stage 1 through stage 4 chronic kidney disease, or unspecified chronic kidney disease: Secondary | ICD-10-CM | POA: Diagnosis not present

## 2017-11-03 DIAGNOSIS — J9611 Chronic respiratory failure with hypoxia: Secondary | ICD-10-CM | POA: Diagnosis not present

## 2017-11-03 DIAGNOSIS — E1122 Type 2 diabetes mellitus with diabetic chronic kidney disease: Secondary | ICD-10-CM | POA: Diagnosis not present

## 2017-11-03 DIAGNOSIS — N183 Chronic kidney disease, stage 3 unspecified: Secondary | ICD-10-CM

## 2017-11-03 LAB — POCT GLYCOSYLATED HEMOGLOBIN (HGB A1C): Hemoglobin A1C: 5.3

## 2017-11-05 DIAGNOSIS — J9611 Chronic respiratory failure with hypoxia: Secondary | ICD-10-CM | POA: Insufficient documentation

## 2017-11-05 NOTE — Progress Notes (Signed)
Lucas Price is a 77 y.o. male is here for follow up.  History of Present Illness:   HPI:   1. Type 2 diabetes mellitus with stage 3 chronic kidney disease, with long-term current use of insulin ([place]) patient.  Patient is a retired Film/video editor.  He is doing very well with maintaining his blood sugars.  He reports no hypoglycemic events, though his wife contradicts this.  We have had discussions in the past regarding decreasing his insulin, but he is adamant that he wants tight control of his diabetes.  He has been following with his cardiologist and nephrologist.  No new concerns.   2. 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  Avoiding excessive salt intake? [x]   YES  []   NO Trying to exercise on a regular basis? []   YES  [x]   NO Review: taking medications as instructed, no medication side effects noted, no TIAs, no chest pain on exertion, no dyspnea on exertion, no swelling of ankles.   Wt Readings from Last 3 Encounters:  11/03/17 182 lb 12.8 oz (82.9 kg)  09/30/17 183 lb (83 kg)  09/22/17 180 lb (81.6 kg)   BP Readings from Last 3 Encounters:  11/03/17 112/68  10/16/17 102/60  10/07/17 (!) 95/51   Lab Results  Component Value Date   CREATININE 1.93 (H) 10/07/2017    3. Pulmonary nodules, followed by pulmonology.  Doing well.   4. Chronic obstructive pulmonary disease, unspecified COPD type ([place]) patient uses 1.5 liters of oxygen via nasal cannula throughout the day.  He never wears this to the office because of the hassle.   5. Chronic respiratory failure with hypoxia ([place]). As above.   6. Morbid obesity ([place]).   Wt Readings from Last 3 Encounters:  11/03/17 182 lb 12.8 oz (82.9 kg)  09/30/17 183 lb (83 kg)  09/22/17 180 lb (81.6 kg)     Review of Systems  Constitutional: Negative for chills, fever, malaise/fatigue and weight loss.  Respiratory: Negative for cough, shortness of breath and wheezing.   Cardiovascular:  Negative for chest pain, palpitations and leg swelling.  Gastrointestinal: Negative for abdominal pain, constipation, diarrhea, nausea and vomiting.  Genitourinary: Negative for dysuria and urgency.  Musculoskeletal: Negative for joint pain and myalgias.  Skin: Negative for rash.  Neurological: Negative for dizziness and headaches.  Psychiatric/Behavioral: Negative for depression, substance abuse and suicidal ideas. The patient is not nervous/anxious.    Health Maintenance Due  Topic Date Due  . OPHTHALMOLOGY EXAM  08/15/1950  . PNA vac Low Risk Adult (1 of 2 - PCV13) 08/16/2007   Depression screen PHQ 2/9 04/29/2017  Decreased Interest 0  Down, Depressed, Hopeless 0  PHQ - 2 Score 0   PMHx, SurgHx, SocialHx, FamHx, Medications, and Allergies were reviewed in the Visit Navigator and updated as appropriate.   Patient Active Problem List   Diagnosis Date Noted  . Chronic respiratory failure with hypoxia ([place]) 11/05/2017  . 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 Abnormal finding on GI tract imaging   . Benign neoplasm of ascending colon   . Pulmonary nodules 09/08/2017  . 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 Renal calculus 03/19/2015  . Anemia in chronic kidney disease (CKD) 05/26/2014  . CKD (chronic kidney disease) stage 3, GFR 30-59 ml/min (HCC) 05/26/2014  . COPD (chronic obstructive pulmonary disease) ([place]) 05/26/2014  . Controlled insulin dependent diabetes mellitus with renal manifestation ([place]) 05/26/2014  . Former smoker 05/26/2014  . GERD (  gastroesophageal reflux disease) 05/26/2014  . 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 Bladder neck obstruction 04/24/2014  . Recurrent urinary tract infection 09/05/2013  . Retention of urine 09/05/2013   Social History   Tobacco Use  . Smoking status: Former Smoker    Years: 2.00  . Smokeless tobacco: Former Systems developer  . Tobacco  comment: smokes 4-5 every other day  Substance Use Topics  . Alcohol use: No  . Drug use: No   Current Medications and Allergies:   Current Outpatient Medications:  .  allopurinol (ZYLOPRIM) 100 MG tablet, Take 1 tablet (100 mg total) daily by mouth., Disp: 90 tablet, Rfl: 1 .  aspirin EC 81 MG tablet, Take 81 mg by mouth daily. , Disp: , Rfl:  .  budesonide-formoterol (SYMBICORT) 160-4.5 MCG/ACT inhaler, Inhale 2 puffs into the lungs 2 (two) times daily., Disp: , Rfl:  .  calcium carbonate (CALCIUM 600) 600 MG TABS tablet, Take 600 mg by mouth daily. , Disp: , Rfl:  .  Cholecalciferol (D 10000) 10000 units CAPS, Take 10,000 Units by mouth 2 (two) times a week. , Disp: , Rfl:  .  Coenzyme Q-10 200 MG CAPS, Take 200 mg by mouth daily. , Disp: , Rfl:  .  Continuous Blood Gluc Sensor ([place]) MISC, Per instructions., Disp: 1 each, Rfl: 2 .  dorzolamide (TRUSOPT) 2 % ophthalmic solution, , Disp: , Rfl:  .  glimepiride (AMARYL) 4 MG tablet, Take 1 tablet (4 mg total) by mouth daily with breakfast., Disp: 90 tablet, Rfl: 1 .  guaiFENesin (MUCINEX) 600 MG 12 hr tablet, Take 1,200 mg by mouth 2 (two) times daily., Disp: , Rfl:  .  insulin glargine (LANTUS) 100 UNIT/ML injection, Inject 0.36 mLs (36 Units total) into the skin at bedtime., Disp: , Rfl:  .  Insulin Syringe-Needle U-100 (RELION INSULIN SYRINGE 1ML/31G) 31G X 5/16" 1 ML MISC, 1 Syringe by Does not apply route 3 (three) times daily., Disp: 300 each, Rfl: 1 .  losartan (COZAAR) 50 MG tablet, Take 0.5 tablets (25 mg total) by mouth daily., Disp: , Rfl:  .  metoprolol (TOPROL-XL) 200 MG 24 hr tablet, Take 50 mg by mouth daily. , Disp: , Rfl:  .  OXYGEN, Inhale 1.5 L into the lungs., Disp: , Rfl:  .  sitaGLIPtin (JANUVIA) 100 MG tablet, Take 50mg  daily, Disp: , Rfl:  .  Syringe, Disposable, 1 ML MISC, Use with insulin needle., Disp: 100 each, Rfl: 3 .  Tiotropium Bromide Monohydrate (SPIRIVA RESPIMAT) 2.5 MCG/ACT AERS,  Inhale 2 puffs into the lungs daily., Disp: 4 g, Rfl: 3 .  torsemide (DEMADEX) 100 MG tablet, Take 0.5 tablets (50 mg total) by mouth daily. (Patient taking differently: Take 25 mg by mouth daily. ), Disp: , Rfl:  .  Travoprost, BAK Free, (TRAVATAN Z) 0.004 % SOLN ophthalmic solution, Place 1 drop in both eyes once daily, Disp: , Rfl:   No Known Allergies   Review of Systems   Pertinent items are noted in the HPI. Otherwise, ROS is negative.  Vitals:   Vitals:   11/03/17 1529  BP: 112/68  Pulse: 75  Temp: (!) 97.5 F (36.4 C)  TempSrc: Oral  SpO2: 90%  Weight: 182 lb 12.8 oz (82.9 kg)  Height: 5\' 3"  (1.6 m)     Body mass index is 32.38 kg/m.  Physical Exam:   Physical Exam  Constitutional: He is oriented to person, place, and time. He appears well-developed and well-nourished. No distress.  HENT:  Head: Normocephalic and atraumatic.  Right Ear: External ear normal.  Left Ear: External ear normal.  Nose: Nose normal.  Mouth/Throat: Oropharynx is clear and moist.  Eyes: Pupils are equal, round, and reactive to light. Conjunctivae and EOM are normal.  Neck: Normal range of motion. Neck supple.  Cardiovascular: Normal rate, regular rhythm, normal heart sounds and intact distal pulses.  Pulmonary/Chest: Effort normal and breath sounds normal.  Abdominal: Soft. Bowel sounds are normal.  Musculoskeletal: Normal range of motion.  Neurological: He is alert and oriented to person, place, and time.  Skin: Skin is warm and dry.  Psychiatric: He has a normal mood and affect. His behavior is normal. Judgment and thought content normal.  Nursing note and vitals reviewed.   Results for orders placed or performed in visit on 11/03/17  POCT glycosylated hemoglobin (Hb A1C)  Result Value Ref Range   Hemoglobin A1C 5.3    Assessment and Plan:   1. Type 2 diabetes mellitus with stage 3 chronic kidney disease, with long-term current use of insulin (HCC) Well controlled.  No signs of  complications, medication side effects, or red flags.  Continue current regimen.  Lipid-lowering therapy was not prescribed due to patient refusal.  - POCT glycosylated hemoglobin (Hb A1C)  2. 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  No signs of complications, medication side effects, or red flags.  Continue current regimen.    3. Pulmonary nodules Recent negative biopsies.  4. Chronic obstructive pulmonary disease, unspecified COPD type (HCC) Wearing oxygen.   5. Chronic respiratory failure with hypoxia (HCC)  6. Morbid obesity ([place]) The patient is asked to make an attempt to improve diet and exercise patterns to aid in medical management of this problem.    . Reviewed expectations re: course of current medical issues. . Discussed self-management of symptoms. . Outlined signs and symptoms indicating need for more acute intervention. . Patient verbalized understanding and all questions were answered. [name] Health Maintenance issues including appropriate healthy diet, exercise, and smoking avoidance were discussed with patient. . See orders for this visit as documented in the electronic medical record. . Patient received an After Visit Summary.   Briscoe Deutscher, DO [place], [facility] 11/05/2017

## 2017-11-06 LAB — FUNGUS CULTURE WITH STAIN

## 2017-11-06 LAB — FUNGUS CULTURE RESULT

## 2017-11-06 LAB — FUNGAL ORGANISM REFLEX

## 2017-11-13 ENCOUNTER — Telehealth: Payer: Self-pay | Admitting: Emergency Medicine

## 2017-11-13 NOTE — Telephone Encounter (Signed)
Attempted to contact pt. No answer, no option to leave a message. Will try back.  

## 2017-11-16 NOTE — Telephone Encounter (Signed)
Attempted to call patient, no answer, message left to call back.  

## 2017-11-17 NOTE — Telephone Encounter (Signed)
ATC pt, no answer. Left message for pt to call back.  

## 2017-11-18 ENCOUNTER — Telehealth: Payer: Self-pay | Admitting: Emergency Medicine

## 2017-11-18 NOTE — Telephone Encounter (Signed)
Patient called and requested copy of pathology report to be sent to him from the bronchoscopy -pr    Patient was returning call from last week. He was requesting a copy of his bronchoscopy results from February 2019. Advised patient that I could either mail them or place them up front. He wishes to have them mailed to him. Verified his address.   Results have been placed in mail. Nothing else needed at time of call.

## 2017-11-18 NOTE — Telephone Encounter (Signed)
We have attempted to contact the pt several times with no success or call back from the pt. Per triage protocol, message will be closed.  

## 2017-11-19 LAB — ACID FAST CULTURE WITH REFLEXED SENSITIVITIES: ACID FAST CULTURE - AFSCU3: NEGATIVE

## 2017-11-19 LAB — ACID FAST CULTURE WITH REFLEXED SENSITIVITIES (MYCOBACTERIA): Acid Fast Culture: NEGATIVE

## 2017-11-23 DIAGNOSIS — H35352 Cystoid macular degeneration, left eye: Secondary | ICD-10-CM | POA: Diagnosis not present

## 2017-11-23 DIAGNOSIS — H35372 Puckering of macula, left eye: Secondary | ICD-10-CM | POA: Diagnosis not present

## 2017-11-23 DIAGNOSIS — E113592 Type 2 diabetes mellitus with proliferative diabetic retinopathy without macular edema, left eye: Secondary | ICD-10-CM | POA: Diagnosis not present

## 2017-11-23 DIAGNOSIS — H4010X1 Unspecified open-angle glaucoma, mild stage: Secondary | ICD-10-CM

## 2017-11-23 DIAGNOSIS — H4010X0 Unspecified open-angle glaucoma, stage unspecified: Secondary | ICD-10-CM

## 2017-11-23 HISTORY — DX: Unspecified open-angle glaucoma, stage unspecified: H40.10X0

## 2017-12-08 DIAGNOSIS — Z09 Encounter for follow-up examination after completed treatment for conditions other than malignant neoplasm: Secondary | ICD-10-CM | POA: Diagnosis not present

## 2017-12-08 DIAGNOSIS — H35372 Puckering of macula, left eye: Secondary | ICD-10-CM | POA: Diagnosis not present

## 2017-12-17 ENCOUNTER — Other Ambulatory Visit: Payer: Self-pay | Admitting: Family Medicine

## 2017-12-23 ENCOUNTER — Inpatient Hospital Stay: Admission: RE | Admit: 2017-12-23 | Payer: Medicare Other | Source: Ambulatory Visit

## 2017-12-30 ENCOUNTER — Ambulatory Visit (INDEPENDENT_AMBULATORY_CARE_PROVIDER_SITE_OTHER): Payer: Medicare Other | Admitting: Internal Medicine

## 2017-12-30 ENCOUNTER — Encounter: Payer: Self-pay | Admitting: Internal Medicine

## 2017-12-30 VITALS — BP 118/68 | HR 71 | Ht 63.0 in | Wt 186.0 lb

## 2017-12-30 DIAGNOSIS — J9611 Chronic respiratory failure with hypoxia: Secondary | ICD-10-CM | POA: Diagnosis not present

## 2017-12-30 DIAGNOSIS — R918 Other nonspecific abnormal finding of lung field: Secondary | ICD-10-CM | POA: Diagnosis not present

## 2017-12-30 DIAGNOSIS — Z8709 Personal history of other diseases of the respiratory system: Secondary | ICD-10-CM

## 2017-12-30 DIAGNOSIS — R0989 Other specified symptoms and signs involving the circulatory and respiratory systems: Secondary | ICD-10-CM

## 2017-12-30 NOTE — Addendum Note (Signed)
Addended by: Lorretta Harp on: 12/30/2017 03:13 PM   Modules accepted: Orders

## 2017-12-30 NOTE — Patient Instructions (Addendum)
Multiple lung nodules on CT  - 2cm Left lower lobe and 2cm RML - non-diagnostic on ENB Feb 2019   - respect your decision to decline clsoe serial CT  - repeat CT chest without contrast in 6 months from 12/30/2017   History of asthma - continue symbicort and spiriva scheduled  Chronic respiratory failure with hypoxia ([place]) - continue o2 at night with bipap as before  Chronic sinus draniage  - refer ENT

## 2017-12-30 NOTE — Progress Notes (Signed)
Subjective:     Patient ID: Lucas Price, male   DOB: 1940/12/17, 77 y.o.   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 He is on Spiriva and Symbicort. He has been followed by Dr. Chase Caller in our office for this and also for possible OSA/obesity hypoventilation syndrome, on BiPAP at night. He had the BCG vaccine.   He reports some dry cough, increased over last 4 months. No sputum, no hemoptysis. He has some decreased appetite, has lost about 30 lbs over 6 months.    IOV 07/13/2017  Chief Complaint  Patient presents with  . Advice Only    Referred by Dr. [name] due to SOB. Denies any complaints of CP but states that he has complaints of mild chronic cough. Pt states that he wears O2 at home, DME: [employer] 49.5L   77 year old male. Immigrant from [place]. Moved to the [place] in 1969. 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 Moved to [facility] for years ago to be with his son who owns [employer] stores in [place]. 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 Starting some 4 years ago when he moved to [place] he started losing weight intentionally and is lost from 225 pounds 285 pounds currently. He tells me that approximately 20 years ago he was diagnosed with restrictive lung disease" and is been on inhaler therapy since then. He says the restriction is on account of his obesity. But at the same time he also tells me that he probably has interstitial lung disease. He is also on inhaler therapy which he says is not helping him. He is not sure if that his asthma or COPD although he is not smoking other than very limited smoking many years ago in his younger days. He's been on BiPAP with oxygen at night for the last few to  several years through [place] [facility] care but he says his sleep study was negative for years ago. He does not have a pulmonologist here currently he has dyspnea on exertion and some cough that is mild to moderate in severity but overall stable. He is here to establish with pulmonologist. He does admit that sometimes when he walks he desaturates into the 60s. With our office he only walked half lap and he says he is impressed that he is able to walk that far  Walking desaturation test on an 85 feet 3 laps o 0 n room air with a full head probe will and he walked with a cane he did he only walked half lap and got dyspneic and wanted to stop. His resting heart rate was 72 and he hit a peak heart rate of 111 and just have her lab. His resting pulse ox was 97% and he dropped to 91% in half lap when he stopped.  Review of the chart also shows he has mild anemia of chronic disease and chronic kidney disease    OV 07/29/2017  Chief Complaint  Patient presents with  . Follow-up    Pt had a HRCT, PFT, ABG all done 12/11 and is going to have a PET scan done 12/24.  States that there is no change in the SOB. Denies any cough or CP.   Lucas Price returns for follow-up.  He is  here to discuss test results.  He had a CT scan high resolution July 22, 2017.  This does not show any interstitial lung disease in my personal visualization.  Agree with the findings of new lung nodules.  There is no air trapping or emphysema to suggest asthma/emphysema.  However his ABG showed some hypoxemia without hypercapnia.  He is on asthma inhalers as baseline and also BiPAP at night at baseline.  He wants to continue these.  The CT scan shows some nonspecific inflammation.  He has some new findings of coronary artery calcification.  He has a slight high IgE to suggest asthma.  Overall the findings and his previous therapy are not making sense  He is complaining of some eye discharge for a month.  He is asking for ciprofloxacin eyedrops.   I referred him to his primary care physician for this.   ROV 09/30/17 - -  -this is a follow-up visit for patient with a minimal smoking history, history of OSA and obesity hypoventilation syndrome on BiPAP.  I am following him for an abnormal CT scan of his chest that shows 2 nodular areas: Inferior right middle lobe and medial aspect of the left lower lobe in the perihilar region.  The medial left lower lobe lesion is hypermetabolic by PET scan 42/68/34.  I treated him empirically with Levaquin to see if the areas were changed. Developed thrush, took it for 5 days. His QuantiFERON gold was negative.  Repeat CT scan of the chest done on 09/18/17 was reviewed by me.  This shows that both nodules persist, are unchanged in size or appearance.  He returns today to discuss next steps.   He underwent a CT scan of his chest 07/22/17 to evaluate for interstitial lung disease.  I have reviewed all the radiology.  The CT scan showed 2 predominant nodular areas, one in the inferior right middle lobe 1.5 x 2.4 cm, another in the medial aspect of the left lower lobe 2.1 x 1.7 cm in the hilar region.  There is some associated narrowing and calcification of the adjacent medial left lower lobe airways.  A PET scan done on 08/03/17 showed that the medial left lower lobe lesion is significantly hypermetabolic.  There is mild hypermetabolism in the right middle lobe nodule as well.  He is referred today to discuss further workup.   ROV 10/16/17 --Lucas Price follows up today after his navigational bronchoscopy bronchoscopy  and endobronchial ultrasound that was performed on 10/07/17 to eval RML and LLL nodules hypermetabolic on PET. all cytology and pathology is negative. AFB and fungal smears negative. 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   OV 12/30/2017  Chief Complaint  Patient presents with  . Follow-up    Pt states he has been doing better since last visit. States he is coughing less and SOB is also  less. Pt was scheduled to have a CT scan but cancelled the scan.     Lucas Price presents for follow-up for multiple issues.  His son for Deno Etienne  whose owner of the local [employer] is here with him.  I am meeting him for the first time  -  coronary artery calcification: [facility] and has been reassured he has had an echocardiogram according to his history  -Multiple lung nodules: 2 cm left lower lobe and 2 cm right middle lobe.  He had navigational bronchoscopy February 2019 by Dr. Lamonte Sakai these were nondiagnostic.  He supposed of  had a follow-up serial CT chest today.  But he is pretty adamant that even though it is nondiagnostic and does not rule out cancer he does not want close serial follow-up.  He does not think he will undergo event radiation therapy or resection of this thing were to grow.  After much discussion he finally agreed to have a follow-up CT in 6 months  History of asthma: He is on Symbicort and Spiriva from before.  He continues this without any change  Chronic respiratory failure with hypoxemia he continues his oxygen BiPAP as before  New issue of chronic sinus drainage.  He wants a ENT referral    has a past medical history of Anemia (05/26/2014), Bladder neck obstruction (04/24/2014), CKD (chronic kidney disease) (05/26/2014), COPD (chronic obstructive pulmonary disease) ([place]) (05/26/2014), Diabetes mellitus ([place]) (05/26/2014), Former smoker (05/26/2014), GERD (gastroesophageal reflux disease) (05/26/2014), Glaucoma, Heart murmur, HTN (hypertension) (05/26/2014), Lung nodules, Open-angle glaucoma (11/23/2017), Prune belly syndrome (05/26/2014), Recurrent urinary tract infection (09/05/2013), Renal calculus (03/19/2015), Retention of urine (09/05/2013), and Sleep apnea.   reports that he quit smoking about 10 years ago. His smoking use included cigarettes. He has a 0.20 pack-year smoking history. He has quit using smokeless tobacco.  Past  Surgical History:  Procedure Laterality Date  . CATARACT EXTRACTION, BILATERAL    . COLONOSCOPY WITH PROPOFOL N/A 09/22/2017   Procedure: COLONOSCOPY WITH PROPOFOL;  Surgeon: Doran Stabler, MD;  Location: [facility];  Service: Gastroenterology;  Laterality: N/A;  . HERNIA REPAIR Bilateral    Inguinal  . VIDEO BRONCHOSCOPY WITH ENDOBRONCHIAL NAVIGATION N/A 10/07/2017   Procedure: VIDEO BRONCHOSCOPY WITH ENDOBRONCHIAL NAVIGATION;  Surgeon: Collene Gobble, MD;  Location: [facility];  Service: Thoracic;  Laterality: N/A;  . VIDEO BRONCHOSCOPY WITH ENDOBRONCHIAL ULTRASOUND N/A 10/07/2017   Procedure: VIDEO BRONCHOSCOPY WITH ENDOBRONCHIAL ULTRASOUND;  Surgeon: Collene Gobble, MD;  Location: [facility];  Service: Thoracic;  Laterality: N/A;    No Known Allergies  Immunization History  Administered Date(s) Administered  . Influenza, High Dose Seasonal PF 04/29/2017    Family History  Problem Relation Age of Onset  . Heart disease Mother   . Diabetes Mother      Current Outpatient Medications:  .  allopurinol (ZYLOPRIM) 100 MG tablet, TAKE 1 TABLET EVERY DAY, Disp: 90 tablet, Rfl: 1 .  aspirin EC 81 MG tablet, Take 81 mg by mouth daily. , Disp: , Rfl:  .  budesonide-formoterol (SYMBICORT) 160-4.5 MCG/ACT inhaler, Inhale 2 puffs into the lungs 2 (two) times daily., Disp: , Rfl:  .  calcium carbonate (CALCIUM 600) 600 MG TABS tablet, Take 600 mg by mouth daily. , Disp: , Rfl:  .  Cholecalciferol (D 10000) 10000 units CAPS, Take 10,000 Units by mouth 2 (two) times a week. , Disp: , Rfl:  .  Coenzyme Q-10 200 MG CAPS, Take 200 mg by mouth daily. , Disp: , Rfl:  .  dorzolamide (TRUSOPT) 2 % ophthalmic solution, , Disp: , Rfl:  .  glimepiride (AMARYL) 4 MG tablet, Take 1 tablet (4 mg total) by mouth daily with breakfast., Disp: 90 tablet, Rfl: 1 .  guaiFENesin (MUCINEX) 600 MG 12 hr tablet, Take 1,200 mg by mouth 2 (two) times daily., Disp: , Rfl:  .  insulin glargine (LANTUS) 100 UNIT/ML  injection, Inject 0.36 mLs (36 Units total) into the skin at bedtime., Disp: , Rfl:  .  Insulin Syringe-Needle U-100 (RELION INSULIN SYRINGE 1ML/31G) 31G X 5/16" 1 ML MISC, 1 Syringe by Does  not apply route 3 (three) times daily., Disp: 300 each, Rfl: 1 .  losartan (COZAAR) 50 MG tablet, Take 0.5 tablets (25 mg total) by mouth daily., Disp: , Rfl:  .  metoprolol (TOPROL-XL) 200 MG 24 hr tablet, Take 50 mg by mouth daily. , Disp: , Rfl:  .  OXYGEN, Inhale 1.5 L into the lungs., Disp: , Rfl:  .  sitaGLIPtin (JANUVIA) 100 MG tablet, Take 50mg  daily, Disp: , Rfl:  .  Syringe, Disposable, 1 ML MISC, Use with insulin needle., Disp: 100 each, Rfl: 3 .  Tiotropium Bromide Monohydrate (SPIRIVA RESPIMAT) 2.5 MCG/ACT AERS, Inhale 2 puffs into the lungs daily., Disp: 4 g, Rfl: 3 .  Torsemide (DEMADEX PO), Take 25 mg by mouth., Disp: , Rfl:  .  Travoprost, BAK Free, (TRAVATAN Z) 0.004 % SOLN ophthalmic solution, Place 1 drop in both eyes once daily, Disp: , Rfl:  .  ofloxacin (OCUFLOX) 0.3 % ophthalmic solution, , Disp: , Rfl:  .  prednisoLONE acetate (PRED FORTE) 1 % ophthalmic suspension, , Disp: , Rfl: 0    Review of Systems     Objective:   Physical Exam  Constitutional: He is oriented to person, place, and time. He appears well-developed and well-nourished. No distress.  HENT:  Head: Normocephalic and atraumatic.  Right Ear: External ear normal.  Left Ear: External ear normal.  Mouth/Throat: Oropharynx is clear and moist. No oropharyngeal exudate.  Eyes: Pupils are equal, round, and reactive to light. Conjunctivae and EOM are normal. Right eye exhibits no discharge. Left eye exhibits no discharge. No scleral icterus.  Neck: Normal range of motion. Neck supple. No JVD present. No tracheal deviation present. No thyromegaly present.  Cardiovascular: Normal rate, regular rhythm and intact distal pulses. Exam reveals no gallop and no friction rub.  No murmur heard. Pulmonary/Chest: Effort normal and  breath sounds normal. No respiratory distress. He has no wheezes. He has no rales. He exhibits no tenderness.  Abdominal: Soft. Bowel sounds are normal. He exhibits no distension and no mass. There is no tenderness. There is no rebound and no guarding.  Musculoskeletal: Normal range of motion. He exhibits no edema or tenderness.  Lymphadenopathy:    He has no cervical adenopathy.  Neurological: He is alert and oriented to person, place, and time. He has normal reflexes. No cranial nerve deficit. Coordination normal.  Skin: Skin is warm and dry. No rash noted. He is not diaphoretic. No erythema. No pallor.  Psychiatric: He has a normal mood and affect. His behavior is normal. Judgment and thought content normal.  Nursing note and vitals reviewed. .  Vitals:   12/30/17 1434  BP: 118/68  Pulse: 71  SpO2: 95%  Weight: 186 lb (84.4 kg)  Height: 5\' 3"  (1.6 m)    Estimated body mass index is 32.95 kg/m as calculated from the following:   Height as of this encounter: 5\' 3"  (1.6 m).   Weight as of this encounter: 186 lb (84.4 kg).     Assessment:       ICD-10-CM   1. Multiple lung nodules on CT R91.8   2. History of asthma Z87.09   3. Chronic respiratory failure with hypoxia (HCC) J96.11   4. Chronic sinus complaints R09.89        Plan:     Multiple lung nodules on CT  - 2cm Left lower lobe and 2cm RML - non-diagnostic on ENB Feb 2019   - respect your decision to decline clsoe serial CT  -  repeat CT chest without contrast in 6 months from 12/30/2017   History of asthma - continue symbicort and spiriva scheduled  Chronic respiratory failure with hypoxia (HCC) - continue o2 at night with bipap as before  Chronic sinus draniage  - refer ENT   Dr. Brand Males, M.D., [facility].C.P Pulmonary and Critical Care Medicine Staff Physician, [place] Director - Interstitial Lung Disease  Program  Pulmonary [place] [facility] [place], [place], [address]  Pager: [phone], If no answer or between  15:00h - 7:00h: call [phone] Telephone: [phone]

## 2017-12-31 ENCOUNTER — Other Ambulatory Visit: Payer: Self-pay | Admitting: Family Medicine

## 2018-01-12 DIAGNOSIS — R0989 Other specified symptoms and signs involving the circulatory and respiratory systems: Secondary | ICD-10-CM | POA: Diagnosis not present

## 2018-01-12 DIAGNOSIS — J310 Chronic rhinitis: Secondary | ICD-10-CM | POA: Insufficient documentation

## 2018-01-12 DIAGNOSIS — J320 Chronic maxillary sinusitis: Secondary | ICD-10-CM | POA: Diagnosis not present

## 2018-01-12 DIAGNOSIS — R918 Other nonspecific abnormal finding of lung field: Secondary | ICD-10-CM | POA: Diagnosis not present

## 2018-01-26 DIAGNOSIS — H35372 Puckering of macula, left eye: Secondary | ICD-10-CM | POA: Diagnosis not present

## 2018-01-26 DIAGNOSIS — H35352 Cystoid macular degeneration, left eye: Secondary | ICD-10-CM | POA: Diagnosis not present

## 2018-01-26 DIAGNOSIS — Z09 Encounter for follow-up examination after completed treatment for conditions other than malignant neoplasm: Secondary | ICD-10-CM | POA: Diagnosis not present

## 2018-02-02 DIAGNOSIS — R0602 Shortness of breath: Secondary | ICD-10-CM | POA: Diagnosis not present

## 2018-02-02 DIAGNOSIS — I2510 Atherosclerotic heart disease of native coronary artery without angina pectoris: Secondary | ICD-10-CM | POA: Diagnosis not present

## 2018-02-02 DIAGNOSIS — I10 Essential (primary) hypertension: Secondary | ICD-10-CM | POA: Diagnosis not present

## 2018-02-02 DIAGNOSIS — J449 Chronic obstructive pulmonary disease, unspecified: Secondary | ICD-10-CM | POA: Diagnosis not present

## 2018-02-08 DIAGNOSIS — R0602 Shortness of breath: Secondary | ICD-10-CM | POA: Diagnosis not present

## 2018-02-08 DIAGNOSIS — I2510 Atherosclerotic heart disease of native coronary artery without angina pectoris: Secondary | ICD-10-CM | POA: Diagnosis not present

## 2018-02-09 LAB — BASIC METABOLIC PANEL
BUN: 42 — AB (ref 4–21)
Creatinine: 2.2 — AB (ref 0.6–1.3)
Glucose: 42
Potassium: 5 (ref 3.4–5.3)
Sodium: 139 (ref 137–147)

## 2018-02-09 LAB — LIPID PANEL
CHOLESTEROL: 125 (ref 0–200)
HDL: 35 (ref 35–70)
LDL CALC: 21
TRIGLYCERIDES: 106 (ref 40–160)

## 2018-02-09 LAB — HEPATIC FUNCTION PANEL: AST: 18 (ref 14–40)

## 2018-03-09 ENCOUNTER — Encounter: Payer: Self-pay | Admitting: Family Medicine

## 2018-03-09 ENCOUNTER — Ambulatory Visit (INDEPENDENT_AMBULATORY_CARE_PROVIDER_SITE_OTHER): Payer: Medicare Other | Admitting: Family Medicine

## 2018-03-09 VITALS — BP 112/68 | HR 71 | Temp 98.7°F | Ht 63.0 in | Wt 181.0 lb

## 2018-03-09 DIAGNOSIS — Z794 Long term (current) use of insulin: Secondary | ICD-10-CM

## 2018-03-09 DIAGNOSIS — B351 Tinea unguium: Secondary | ICD-10-CM

## 2018-03-09 DIAGNOSIS — E1122 Type 2 diabetes mellitus with diabetic chronic kidney disease: Secondary | ICD-10-CM | POA: Diagnosis not present

## 2018-03-09 DIAGNOSIS — N183 Chronic kidney disease, stage 3 unspecified: Secondary | ICD-10-CM

## 2018-03-09 LAB — POCT GLYCOSYLATED HEMOGLOBIN (HGB A1C): Hemoglobin A1C: 5.5 % (ref 4.0–5.6)

## 2018-03-09 NOTE — Progress Notes (Signed)
Lucas Price is a 77 y.o. male is here for follow up.  History of Present Illness:   HPI:   Current symptoms: no polyuria or polydipsia, no chest pain, dyspnea or TIA's, no numbness, tingling or pain in extremities.   Taking medication compliantly without noted sided effects [x]   YES  []   NO  Episodes of hypoglycemia? []   YES  [x]   NO Maintaining a diabetic diet? [x]   YES  []   NO  Lab Results  Component Value Date   HGBA1C 5.5 03/09/2018    No results found for: Derl Barrow  Lab Results  Component Value Date   CHOL 125 02/09/2018   HDL 35 02/09/2018   LDLCALC 21 02/09/2018   TRIG 106 02/09/2018     Wt Readings from Last 3 Encounters:  03/09/18 181 lb (82.1 kg)  12/30/17 186 lb (84.4 kg)  11/03/17 182 lb 12.8 oz (82.9 kg)   BP Readings from Last 3 Encounters:  03/09/18 112/68  12/30/17 118/68  11/03/17 112/68   Lab Results  Component Value Date   CREATININE 2.2 (A) 02/09/2018   There are no preventive care reminders to display for this patient.   Depression screen PHQ 2/9 04/29/2017  Decreased Interest 0  Down, Depressed, Hopeless 0  PHQ - 2 Score 0   PMHx, SurgHx, SocialHx, FamHx, Medications, and Allergies were reviewed in the Visit Navigator and updated as appropriate.   Patient Active Problem List   Diagnosis Date Noted  . 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 Chronic respiratory failure with hypoxia ([place]) 11/05/2017  . 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 Abnormal finding on GI tract imaging   . Benign neoplasm of ascending colon   . Pulmonary nodules 09/08/2017  . 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 Renal calculus 03/19/2015  . Anemia in chronic kidney disease (CKD) 05/26/2014  . CKD (chronic kidney disease) stage 3, GFR 30-59 ml/min (HCC) 05/26/2014  . COPD (chronic obstructive pulmonary disease) ([place]) 05/26/2014  . Controlled insulin dependent diabetes mellitus with renal manifestation ([place]) 05/26/2014  . Former smoker 05/26/2014    . GERD (gastroesophageal reflux disease) 05/26/2014  . Hypertension in stage 3 chronic kidney disease due to type 2 diabetes mellitus ([place]) 05/26/2014  . 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 Bladder neck obstruction 04/24/2014  . Recurrent urinary tract infection 09/05/2013  . Retention of urine 09/05/2013   Social History   Tobacco Use  . Smoking status: Former Smoker    Packs/day: 0.10    Years: 2.00    Pack years: 0.20    Types: Cigarettes    Last attempt to quit: 12/31/2007    Years since quitting: 10.1  . Smokeless tobacco: Former Systems developer  . Tobacco comment: smokes 4-5 every other day  Substance Use Topics  . Alcohol use: No  . Drug use: No   Current Medications and Allergies:   Current Outpatient Medications:  .  allopurinol (ZYLOPRIM) 100 MG tablet, TAKE 1 TABLET EVERY DAY, Disp: 90 tablet, Rfl: 1 .  aspirin EC 81 MG tablet, Take 81 mg by mouth daily. , Disp: , Rfl:  .  budesonide-formoterol (SYMBICORT) 160-4.5 MCG/ACT inhaler, Inhale 2 puffs into the lungs 2 (two) times daily., Disp: , Rfl:  .  calcium carbonate (CALCIUM 600) 600 MG TABS tablet, Take 600 mg by mouth daily. , Disp: , Rfl:  .  Cholecalciferol (D 10000) 10000 units CAPS, Take 10,000 Units by mouth 2 (two) times a week. , Disp: , Rfl:  .  Coenzyme  Q-10 200 MG CAPS, Take 200 mg by mouth daily. , Disp: , Rfl:  .  dorzolamide (TRUSOPT) 2 % ophthalmic solution, , Disp: , Rfl:  .  glimepiride (AMARYL) 4 MG tablet, TAKE 1 TABLET DAILY WITH BREAKFAST., Disp: 90 tablet, Rfl: 1 .  guaiFENesin (MUCINEX) 600 MG 12 hr tablet, Take 1,200 mg by mouth 2 (two) times daily., Disp: , Rfl:  .  insulin glargine (LANTUS) 100 UNIT/ML injection, Inject 0.36 mLs (36 Units total) into the skin at bedtime., Disp: , Rfl:  .  Insulin Syringe-Needle U-100 (RELION INSULIN SYRINGE 1ML/31G) 31G X 5/16" 1 ML MISC, 1 Syringe by Does not apply route 3 (three) times daily., Disp: 300 each, Rfl: 1 .  losartan (COZAAR) 50 MG tablet, Take 0.5 tablets  (25 mg total) by mouth daily., Disp: , Rfl:  .  metoprolol (TOPROL-XL) 200 MG 24 hr tablet, Take 50 mg by mouth daily. , Disp: , Rfl:  .  ofloxacin (OCUFLOX) 0.3 % ophthalmic solution, , Disp: , Rfl:  .  OXYGEN, Inhale 1.5 L into the lungs., Disp: , Rfl:  .  prednisoLONE acetate (PRED FORTE) 1 % ophthalmic suspension, , Disp: , Rfl: 0 .  sitaGLIPtin (JANUVIA) 100 MG tablet, Take 50mg  daily, Disp: , Rfl:  .  Syringe, Disposable, 1 ML MISC, Use with insulin needle., Disp: 100 each, Rfl: 3 .  Tiotropium Bromide Monohydrate (SPIRIVA RESPIMAT) 2.5 MCG/ACT AERS, Inhale 2 puffs into the lungs daily., Disp: 4 g, Rfl: 3 .  Torsemide (DEMADEX PO), Take 25 mg by mouth., Disp: , Rfl:  .  Travoprost, BAK Free, (TRAVATAN Z) 0.004 % SOLN ophthalmic solution, Place 1 drop in both eyes once daily, Disp: , Rfl:   No Known Allergies Review of Systems   Pertinent items are noted in the HPI. Otherwise, ROS is negative.  Vitals:   Vitals:   03/09/18 1310  BP: 112/68  Pulse: 71  Temp: 98.7 F (37.1 C)  TempSrc: Oral  SpO2: 92%  Weight: 181 lb (82.1 kg)  Height: 5\' 3"  (1.6 m)     Body mass index is 32.06 kg/m.  Physical Exam:   Physical Exam  Constitutional: He is oriented to person, place, and time. He appears well-developed and well-nourished. No distress.  HENT:  Head: Normocephalic and atraumatic.  Eyes: Pupils are equal, round, and reactive to light. Conjunctivae and EOM are normal.  Neck: Normal range of motion. Neck supple.  Cardiovascular: Normal rate, regular rhythm and intact distal pulses.  Pulmonary/Chest: Effort normal.  Abdominal: Soft. Bowel sounds are normal.  Musculoskeletal: Normal range of motion.  Feet:  Right Foot:  Skin Integrity: Negative for ulcer.  Left Foot:  Skin Integrity: Negative for ulcer.  Neurological: He is alert and oriented to person, place, and time.  Skin: Skin is warm and dry.  Psychiatric: He has a normal mood and affect. His behavior is normal.  Judgment and thought content normal.  Nursing note and vitals reviewed.  Diabetic Foot Exam - Simple   Simple Foot Form Diabetic Foot exam was performed with the following findings:  Yes 03/10/2018  6:36 AM  Visual Inspection No deformities, no ulcerations, no other skin breakdown bilaterally:  Yes Sensation Testing Intact to touch and monofilament testing bilaterally:  Yes Pulse Check Posterior Tibialis and Dorsalis pulse intact bilaterally:  Yes Comments Long, thick, yellow nails.     Results for orders placed or performed in visit on 03/09/18  POCT glycosylated hemoglobin (Hb A1C)  Result Value Ref Range  Hemoglobin A1C 5.5 4.0 - 5.6 %   HbA1c POC (<> result, manual entry)  4.0 - 5.6 %   HbA1c, POC (prediabetic range)  5.7 - 6.4 %   HbA1c, POC (controlled diabetic range)  0.0 - 7.0 %    Assessment and Plan:   Lucas Price was seen today for follow-up.  Diagnoses and all orders for this visit:  Type 2 diabetes mellitus with stage 3 chronic kidney disease, with long-term current use of insulin ([place]) Comments: Doing well. Continue current treatment.  Orders: -     POCT glycosylated hemoglobin (Hb A1C) -     Ambulatory referral to Podiatry  Onychomycosis of all toenails Comments: Toenails too long and concern for ulceration if no intervention today. Will refer to Podiatry for f/u but nails trimmed today.    . Reviewed expectations re: course of current medical issues. . Discussed self-management of symptoms. . Outlined signs and symptoms indicating need for more acute intervention. . Patient verbalized understanding and all questions were answered. [name] Health Maintenance issues including appropriate healthy diet, exercise, and smoking avoidance were discussed with patient. . See orders for this visit as documented in the electronic medical record. . Patient received an After Visit Summary.  Briscoe Deutscher, DO [place] 03/10/2018  Future Appointments  Date  Time Provider [place]  06/09/2018  1:40 PM Briscoe Deutscher, DO [facility]

## 2018-03-10 ENCOUNTER — Encounter: Payer: Self-pay | Admitting: Family Medicine

## 2018-03-30 DIAGNOSIS — E113492 Type 2 diabetes mellitus with severe nonproliferative diabetic retinopathy without macular edema, left eye: Secondary | ICD-10-CM | POA: Diagnosis not present

## 2018-03-30 DIAGNOSIS — H35352 Cystoid macular degeneration, left eye: Secondary | ICD-10-CM | POA: Diagnosis not present

## 2018-03-30 DIAGNOSIS — H35372 Puckering of macula, left eye: Secondary | ICD-10-CM | POA: Diagnosis not present

## 2018-03-30 DIAGNOSIS — H35071 Retinal telangiectasis, right eye: Secondary | ICD-10-CM | POA: Diagnosis not present

## 2018-03-30 DIAGNOSIS — H35072 Retinal telangiectasis, left eye: Secondary | ICD-10-CM | POA: Diagnosis not present

## 2018-03-31 ENCOUNTER — Ambulatory Visit (INDEPENDENT_AMBULATORY_CARE_PROVIDER_SITE_OTHER): Payer: Medicare Other | Admitting: Family Medicine

## 2018-03-31 ENCOUNTER — Encounter: Payer: Self-pay | Admitting: Family Medicine

## 2018-03-31 VITALS — BP 122/78 | HR 72 | Temp 98.1°F | Wt 182.4 lb

## 2018-03-31 VITALS — BP 122/70 | HR 72 | Temp 98.1°F | Ht 63.0 in | Wt 182.4 lb

## 2018-03-31 DIAGNOSIS — Z794 Long term (current) use of insulin: Secondary | ICD-10-CM

## 2018-03-31 DIAGNOSIS — S90811A Abrasion, right foot, initial encounter: Secondary | ICD-10-CM

## 2018-03-31 DIAGNOSIS — Z23 Encounter for immunization: Secondary | ICD-10-CM | POA: Diagnosis not present

## 2018-03-31 DIAGNOSIS — IMO0001 Reserved for inherently not codable concepts without codable children: Secondary | ICD-10-CM

## 2018-03-31 DIAGNOSIS — S91312A Laceration without foreign body, left foot, initial encounter: Secondary | ICD-10-CM | POA: Diagnosis not present

## 2018-03-31 DIAGNOSIS — E1129 Type 2 diabetes mellitus with other diabetic kidney complication: Secondary | ICD-10-CM

## 2018-03-31 MED ORDER — MUPIROCIN 2 % EX OINT
1.0000 | TOPICAL_OINTMENT | Freq: Two times a day (BID) | CUTANEOUS | 0 refills | Status: DC
Start: 2018-03-31 — End: 2018-05-04

## 2018-03-31 MED ORDER — DOXYCYCLINE HYCLATE 100 MG PO TABS: 100.0000 mg | ORAL_TABLET | Freq: Two times a day (BID) | ORAL | 0 refills | Status: DC

## 2018-03-31 NOTE — Progress Notes (Signed)
Lucas Price is a 77 y.o. male here for an acute visit.  History of Present Illness:   HPI: Skin tear to left heel. Happened an hour ago by hitting a piece of furniture. Bled. Hx of controlled DM. Here for evaluation and bandaging.   PMHx, SurgHx, SocialHx, Medications, and Allergies were reviewed in the Visit Navigator and updated as appropriate.  Current Medications:   .  allopurinol (ZYLOPRIM) 100 MG tablet, TAKE 1 TABLET EVERY DAY, Disp: 90 tablet, Rfl: 1 .  aspirin EC 81 MG tablet, Take 81 mg by mouth daily. , Disp: , Rfl:  .  budesonide-formoterol (SYMBICORT) 160-4.5 MCG/ACT inhaler, Inhale 2 puffs into the lungs 2 (two) times daily., Disp: 3 Inhaler, Rfl: 3 .  calcium carbonate (CALCIUM 600) 600 MG TABS tablet, Take 600 mg by mouth daily. , Disp: , Rfl:  .  Cholecalciferol (D 10000) 10000 units CAPS, Take 10,000 Units by mouth 2 (two) times a week. , Disp: , Rfl:  .  Coenzyme Q-10 200 MG CAPS, Take 200 mg by mouth daily. , Disp: , Rfl:  .  dorzolamide (TRUSOPT) 2 % ophthalmic solution, , Disp: , Rfl:  .  doxycycline (VIBRA-TABS) 100 MG tablet, Take 1 tablet (100 mg total) by mouth 2 (two) times daily., Disp: 20 tablet, Rfl: 0 .  glimepiride (AMARYL) 4 MG tablet, TAKE 1 TABLET DAILY WITH BREAKFAST., Disp: 90 tablet, Rfl: 1 .  guaiFENesin (MUCINEX) 600 MG 12 hr tablet, Take 1,200 mg by mouth 2 (two) times daily., Disp: , Rfl:  .  insulin glargine (LANTUS) 100 UNIT/ML injection, Inject 0.36 mLs (36 Units total) into the skin at bedtime., Disp: , Rfl:  .  Insulin Syringe-Needle U-100 (RELION INSULIN SYRINGE 1ML/31G) 31G X 5/16" 1 ML MISC, 1 Syringe by Does not apply route 3 (three) times daily., Disp: 300 each, Rfl: 1 .  losartan (COZAAR) 50 MG tablet, Take 0.5 tablets (25 mg total) by mouth daily., Disp: , Rfl:  .  metoprolol (TOPROL-XL) 200 MG 24 hr tablet, Take 50 mg by mouth daily. , Disp: , Rfl:  .  mupirocin ointment (BACTROBAN) 2 %, Apply 1 application topically 2 (two) times  daily., Disp: 22 g, Rfl: 0 .  ofloxacin (OCUFLOX) 0.3 % ophthalmic solution, , Disp: , Rfl:  .  OXYGEN, Inhale 1.5 L into the lungs., Disp: , Rfl:  .  prednisoLONE acetate (PRED FORTE) 1 % ophthalmic suspension, , Disp: , Rfl: 0 .  sitaGLIPtin (JANUVIA) 100 MG tablet, Take 50mg  daily, Disp: , Rfl:  .  Syringe, Disposable, 1 ML MISC, Use with insulin needle., Disp: 100 each, Rfl: 3 .  Tiotropium Bromide Monohydrate (SPIRIVA RESPIMAT) 2.5 MCG/ACT AERS, Inhale 2 puffs into the lungs daily., Disp: 4 g, Rfl: 3 .  Torsemide (DEMADEX PO), Take 25 mg by mouth., Disp: , Rfl:  .  Travoprost, BAK Free, (TRAVATAN Z) 0.004 % SOLN ophthalmic solution, Place 1 drop in both eyes once daily, Disp: , Rfl:    No Known Allergies   Review of Systems:   Pertinent items are noted in the HPI. Otherwise, ROS is negative.  Vitals:   Vitals:   03/31/18 1332  BP: 122/78  Pulse: 72  Temp: 98.1 F (36.7 C)  TempSrc: Oral  SpO2: 98%  Weight: 182 lb 6.4 oz (82.7 kg)     Body mass index is 32.31 kg/m.  Physical Exam:   Physical Exam  Constitutional: He is oriented to person, place, and time. He appears well-developed  and well-nourished. No distress.  HENT:  Head: Normocephalic and atraumatic.  Right Ear: External ear normal.  Left Ear: External ear normal.  Nose: Nose normal.  Mouth/Throat: Oropharynx is clear and moist.  Eyes: Pupils are equal, round, and reactive to light. Conjunctivae and EOM are normal.  Neck: Normal range of motion. Neck supple.  Cardiovascular: Normal rate, regular rhythm, normal heart sounds and intact distal pulses.  Pulmonary/Chest: Effort normal and breath sounds normal.  Abdominal: Soft. Bowel sounds are normal.  Musculoskeletal: Normal range of motion.  Neurological: He is alert and oriented to person, place, and time.  Skin: Skin is warm and dry.  Skin tear to heel. Flap closure. No active bleeding or sign of infection.   Psychiatric: He has a normal mood and affect.  His behavior is normal. Judgment and thought content normal.  Nursing note and vitals reviewed.   Results for orders placed or performed in visit on 03/09/18  POCT glycosylated hemoglobin (Hb A1C)  Result Value Ref Range   Hemoglobin A1C 5.5 4.0 - 5.6 %   HbA1c POC (<> result, manual entry)  4.0 - 5.6 %   HbA1c, POC (prediabetic range)  5.7 - 6.4 %   HbA1c, POC (controlled diabetic range)  0.0 - 7.0 %    Assessment and Plan:   Diagnoses and all orders for this visit:  Tear of skin of left heel, initial encounter Comments: Acute. No red flags. Safety net Abx provided. Discussed offloading pressure. Bandaged and aftercare reviewed.  Orders: -     doxycycline (VIBRA-TABS) 100 MG tablet; Take 1 tablet (100 mg total) by mouth 2 (two) times daily. -     mupirocin ointment (BACTROBAN) 2 %; Apply 1 application topically 2 (two) times daily. 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 Reviewed expectations re: course of current medical issues. . Discussed self-management of symptoms. . Outlined signs and symptoms indicating need for more acute intervention. . Patient verbalized understanding and all questions were answered. [name] Health Maintenance issues including appropriate healthy diet, exercise, and smoking avoidance were discussed with patient. . See orders for this visit as documented in the electronic medical record. . Patient received an After Visit Summary.   Briscoe Deutscher, DO [place], [facility] 04/03/2018

## 2018-03-31 NOTE — Progress Notes (Deleted)
Patient: Lucas Price MRN: [id number] DOB: [birth year] PCP: Briscoe Deutscher, DO     Subjective:  No chief complaint on file.   HPI: The patient is a 78 y.o. male who presents today for injury to right heell   Review of Systems  Allergies Patient has No Known Allergies.  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  Surgical History Patient  has a past surgical history that includes Cataract extraction, bilateral; Hernia repair (Bilateral); Colonoscopy with propofol (N/A, 09/22/2017); Video bronchoscopy with endobronchial navigation (N/A, 10/07/2017); and Video bronchoscopy with endobronchial ultrasound (N/A, 10/07/2017).  Family History Pateint's family history includes Diabetes in his mother; Heart disease in his mother.  Social History Patient  reports that he quit smoking about 10 years ago. His smoking use included cigarettes. He has a 0.20 pack-year smoking history. He has quit using smokeless tobacco. He reports that he does not drink alcohol or use drugs.    Objective: There were no vitals filed for this visit.  There is no height or weight on file to calculate BMI.  Physical Exam     Assessment/plan:      No follow-ups on file.     @AWME @ 03/31/2018

## 2018-04-01 ENCOUNTER — Telehealth: Payer: Self-pay | Admitting: Internal Medicine

## 2018-04-01 MED ORDER — BUDESONIDE-FORMOTEROL FUMARATE 160-4.5 MCG/ACT IN AERO: 2.0000 | INHALATION_SPRAY | Freq: Two times a day (BID) | RESPIRATORY_TRACT | 3 refills | Status: AC

## 2018-04-01 NOTE — Telephone Encounter (Signed)
Called and spoke to pt.  Pt is requesting Rx for Symbicort to be sent to [facility]. Rx for symbicort has been sent to preferred pharmacy. Nothing further is needed.

## 2018-04-03 ENCOUNTER — Encounter: Payer: Self-pay | Admitting: Family Medicine

## 2018-04-05 ENCOUNTER — Other Ambulatory Visit: Payer: Self-pay | Admitting: Family Medicine

## 2018-04-06 ENCOUNTER — Telehealth: Payer: Self-pay | Admitting: Internal Medicine

## 2018-04-06 NOTE — Telephone Encounter (Signed)
Called and spoke with patient, advised him that MR wanted patient to have his CT scan 6 months after his OV in May. Advised patient that this would be scheduled closer to that time. Nothing further needed.

## 2018-04-07 ENCOUNTER — Telehealth: Payer: Self-pay | Admitting: Internal Medicine

## 2018-04-07 DIAGNOSIS — R911 Solitary pulmonary nodule: Secondary | ICD-10-CM

## 2018-04-07 NOTE — Telephone Encounter (Signed)
His 6 month scan is due in noivember but if he wants to have it earlier that is fine with me. Can do it next fwe weeks and return to see me or APP

## 2018-04-07 NOTE — Telephone Encounter (Signed)
Called patient, patient is requesting to have his CT done sooner than requested in MD Ramaswamy's AVS from 12/30/2017. Informed patient we would route this message to MD [facility] and call him back.   MR please advise as to whether patient can have CT sooner than October. Patient would like done ASAP.

## 2018-04-07 NOTE — Telephone Encounter (Signed)
Called and spoke with Patient.  Patient is insisting on talking with Dr. Chase Caller.  Patient would like CT chest done now.  CT chest is ordered for November.  Explained to Patient when CT is due and that Dr. Chase Caller is not in the office at this time.  I explained that I will send him a message and once he gets back in touch with [place], someone will call him back about rescheduling CT chest.  Will route to Dr. Chase Caller

## 2018-04-07 NOTE — Telephone Encounter (Signed)
Pt is returning call. Pt is requesting a call back from the nurse. Cb is [phone].

## 2018-04-07 NOTE — Telephone Encounter (Signed)
Order placed for Chest CT. Patient aware [facility] will be calling to schedule. Nothing further needed at this time.

## 2018-04-09 ENCOUNTER — Telehealth: Payer: Self-pay | Admitting: Family Medicine

## 2018-04-09 ENCOUNTER — Other Ambulatory Visit: Payer: Self-pay | Admitting: Family Medicine

## 2018-04-09 MED ORDER — SITAGLIPTIN PHOSPHATE 100 MG PO TABS: ORAL_TABLET | ORAL | 0 refills | Status: DC

## 2018-04-09 NOTE — Telephone Encounter (Signed)
See note.   Copied from [place] [phone]. Topic: General - Other >> Apr 09, 2018  5:06 PM Keene Breath wrote: Reason for CRM: Pharmacy called to get clarification on prescription for sitaGLIPtin (JANUVIA) 100 MG tablet, should prescription be for 50 mg 1 a day or 100 mg 1/2 a day.  Please advise.  CB# [phone].

## 2018-04-09 NOTE — Telephone Encounter (Signed)
See note

## 2018-04-09 NOTE — Telephone Encounter (Signed)
Januvia Refill Last refill per historic provider PCPWallace LOV 03/31/18 [facility]  [facility] A1C 03/09/18

## 2018-04-09 NOTE — Telephone Encounter (Signed)
Copied from [place] [phone]. Topic: Quick Communication - Rx Refill/Question >> Apr 09, 2018  2:26 PM Waldemar Dickens, Sade R wrote: Medication: sitaGLIPtin (JANUVIA) 100 MG tablet  Has the patient contacted their pharmacy? No Preferred Pharmacy (with phone number or street name): [place], [place] [phone] (Phone) [phone] (Fax)

## 2018-04-09 NOTE — Telephone Encounter (Signed)
Please advise.  Historical provider. 

## 2018-04-13 ENCOUNTER — Other Ambulatory Visit: Payer: Self-pay

## 2018-04-13 MED ORDER — SITAGLIPTIN PHOSPHATE 100 MG PO TABS: ORAL_TABLET | ORAL | 0 refills | Status: DC

## 2018-04-13 MED ORDER — SITAGLIPTIN PHOSPHATE 100 MG PO TABS: 100.0000 mg | ORAL_TABLET | Freq: Every day | ORAL | 0 refills | Status: DC

## 2018-04-13 NOTE — Telephone Encounter (Signed)
Sent in new prescription stating for patient to take half tablet daily per previous prescriptions.

## 2018-04-13 NOTE — Addendum Note (Signed)
Addended by: Durwin Glaze on: 04/13/2018 11:08 AM   Modules accepted: Orders

## 2018-04-14 ENCOUNTER — Encounter: Payer: Self-pay | Admitting: Podiatry

## 2018-04-14 ENCOUNTER — Ambulatory Visit (INDEPENDENT_AMBULATORY_CARE_PROVIDER_SITE_OTHER): Payer: Medicare Other | Admitting: Podiatry

## 2018-04-14 VITALS — BP 130/63 | HR 89

## 2018-04-14 DIAGNOSIS — M79674 Pain in right toe(s): Secondary | ICD-10-CM | POA: Diagnosis not present

## 2018-04-14 DIAGNOSIS — M79675 Pain in left toe(s): Secondary | ICD-10-CM | POA: Diagnosis not present

## 2018-04-14 DIAGNOSIS — E119 Type 2 diabetes mellitus without complications: Secondary | ICD-10-CM | POA: Diagnosis not present

## 2018-04-14 DIAGNOSIS — B351 Tinea unguium: Secondary | ICD-10-CM | POA: Diagnosis not present

## 2018-04-14 NOTE — Progress Notes (Signed)
This patient presents to the office with chief complaint of long thick nails and diabetic feet.  This patient  says there  is  no pain and discomfort in his  feet.  This patient says there are long thick painful nails.  These nails are painful walking and wearing shoes.  Patient has no history of infection or drainage from both feet.  Patient is unable to  self treat his own nails . This patient presents  to the office today for treatment of the  long nails and a foot evaluation due to history of  Diabetes. Patient says he has been scratching the back of right foot/leg and is wearing an ace bandage.  General Appearance  Alert, conversant and in no acute stress.  Vascular  Dorsalis pedis   pulses are palpable  bilaterally. Posterior tibial pulses are absent  B/L. Capillary return is within normal limits  bilaterally. Temperature is within normal limits  bilaterally.  Neurologic  Senn-Weinstein monofilament wire test within normal limits  bilaterally. Muscle power within normal limits bilaterally.  Nails Thick disfigured discolored nails with subungual debris  from hallux to fifth toes bilaterally. No evidence of bacterial infection or drainage bilaterally.  Orthopedic  No limitations of motion of motion feet .  No crepitus or effusions noted.  No bony pathology or digital deformities noted.  Skin  normotropic skin with no porokeratosis noted bilaterally.  No signs of infections or ulcers noted.     Onychomycosis  Diabetes with no foot complications  IE  Debride nails x 10.  A diabetic foot exam was performed and there is no evidence of any vascular or neurologic pathology.   RTC 3 months.   Gardiner Barefoot DPM

## 2018-04-19 ENCOUNTER — Telehealth: Payer: Self-pay | Admitting: Family Medicine

## 2018-04-19 NOTE — Telephone Encounter (Signed)
Copied from [place] [phone]. Topic: Quick Communication - Rx Refill/Question >> Apr 19, 2018  1:43 PM Mcneil, Jacinto Reap wrote: Pt requests that the Rx be sent to [facility] instead of [facility]  Medication: sitaGLIPtin (JANUVIA) 100 MG tablet  Has the patient contacted their pharmacy? yes   Preferred Pharmacy (with phone number or street name): [place], [place] [phone] (Phone) [phone] (Fax)  Agent: Please be advised that RX refills may take up to 3 business days. We ask that you follow-up with your pharmacy.

## 2018-04-20 ENCOUNTER — Ambulatory Visit (INDEPENDENT_AMBULATORY_CARE_PROVIDER_SITE_OTHER)
Admission: RE | Admit: 2018-04-20 | Discharge: 2018-04-20 | Disposition: A | Discharge status: Home / Self Care | Payer: Medicare Other | Source: Ambulatory Visit | Attending: Internal Medicine | Admitting: Internal Medicine

## 2018-04-20 ENCOUNTER — Other Ambulatory Visit: Payer: Self-pay

## 2018-04-20 DIAGNOSIS — R918 Other nonspecific abnormal finding of lung field: Secondary | ICD-10-CM | POA: Diagnosis not present

## 2018-04-20 DIAGNOSIS — R911 Solitary pulmonary nodule: Secondary | ICD-10-CM | POA: Diagnosis not present

## 2018-04-20 MED ORDER — SITAGLIPTIN PHOSPHATE 100 MG PO TABS: 100.0000 mg | ORAL_TABLET | Freq: Every day | ORAL | 0 refills | Status: DC

## 2018-04-20 NOTE — Telephone Encounter (Signed)
Refill sent to mail order. 

## 2018-04-20 NOTE — Telephone Encounter (Signed)
Januvia refill Last Refill:04/13/18 # 90 with 0 refill Last OV: 03/31/18 PCP: Dr. [name] Pharmacy:[facility] Refill sent to [place]. Wants it sent to [facility] instead.

## 2018-04-21 ENCOUNTER — Other Ambulatory Visit: Payer: Medicare Other

## 2018-04-26 ENCOUNTER — Other Ambulatory Visit: Payer: Self-pay | Admitting: Family Medicine

## 2018-04-26 ENCOUNTER — Other Ambulatory Visit: Payer: Self-pay

## 2018-04-26 MED ORDER — SITAGLIPTIN PHOSPHATE 100 MG PO TABS: ORAL_TABLET | ORAL | 0 refills | Status: DC

## 2018-04-27 ENCOUNTER — Telehealth: Payer: Self-pay | Admitting: Internal Medicine

## 2018-04-27 NOTE — Telephone Encounter (Signed)
Attempted to call patient. I did not receive an answer at time of call. I have left a voicemail message for pt to return call. X1

## 2018-04-28 ENCOUNTER — Other Ambulatory Visit: Payer: Self-pay | Admitting: Internal Medicine

## 2018-04-28 DIAGNOSIS — R918 Other nonspecific abnormal finding of lung field: Secondary | ICD-10-CM

## 2018-04-28 NOTE — Telephone Encounter (Signed)
Patient advised of these results and recs nothing further needed at this time

## 2018-04-28 NOTE — Telephone Encounter (Signed)
LMTCB for pt and in the meantime will ask MR about results  MR- please advise on ct chest 04/20/18  Thanks

## 2018-04-28 NOTE — Telephone Encounter (Signed)
Pt is returning call. Cb is [phone].

## 2018-04-28 NOTE — Telephone Encounter (Signed)
All chronic changes - present unchanged since Dec 2018. The radiologist are recommedning continued surveillance. He can do CT chest wth contrast in 6 months and return for followup     SIGNATURE    Dr. Brand Males, M.D., F.C.C.P,  Pulmonary and Critical Care Medicine Staff Physician, [place] Director - Interstitial Lung Disease  Program  Pulmonary [place] [facility] [place], [place], [address]  Pager: [phone], If no answer or between  15:00h - 7:00h: call [phone] Telephone: [phone]  2:54 PM 04/28/2018     Lungs/Pleura:  No pneumothorax.  No right pleural effusion.  Trace dependent left pleural effusion/mild left pleural thickening is decreased since 07/22/2017 and stable since 09/18/2017.   Stable volume loss in the left hemithorax.   Medial left infrahilar 2.6 x 1.8 cm solid pulmonary nodule (series 3/image 91) measured 2.7 x 1.9 cm on 09/18/2017 chest CT and 2.9 x 2.0 cm on 07/22/2017 high-resolution chest CT, stable to slightly decreased, and potentially representing complete atelectasis of the left lower lobe as previously mentioned.   Irregular medial inferior right middle lobe 2.1 x 1.5 cm solid pulmonary nodule (series 3/image 100), previously 2.0 x 1.4 cm on 09/18/2017 and 2.4 x 1.5 cm on 07/22/2017, not appreciably changed.   Scattered subcentimeter pulmonary nodules in right lower lobe measuring up to 0.8 cm (series 3/image 101) are all stable since 07/22/2017.   Parenchymal bands at the left lung base are stable and compatible with postinfectious/postinflammatory scarring.   No acute consolidative airspace disease or new significant pulmonary nodules. .  IMPRESSION: 1. Indeterminate bilateral pulmonary nodularity is stable since 07/22/2017. Dominant medial left infrahilar 2.6 cm solid pulmonary nodule may represent complete atelectasis of the left lower lobe,  as previously described. Given the aggressive morphology of the right middle lobe nodule and hypermetabolism demonstrated within these nodules on prior PET-CT, continued chest CT surveillance is warranted in 6 months. 2. Mild mediastinal adenopathy is stable since 07/22/2017 chest CT, most suggestive of benign etiology. 3. Left main and two-vessel coronary atherosclerosis.  Aortic Atherosclerosis (ICD10-I70.0).   Electronically Signed   By: Ilona Sorrel M.D.   On: 04/20/2018 16:38

## 2018-04-28 NOTE — Telephone Encounter (Signed)
Spoke with the pt and notified we are waiting on MR to review the results, and will then call him back  Please advise thanks!

## 2018-05-03 ENCOUNTER — Telehealth: Payer: Self-pay | Admitting: Family Medicine

## 2018-05-03 NOTE — Telephone Encounter (Signed)
Called insurance cancelled order not needed.

## 2018-05-03 NOTE — Telephone Encounter (Signed)
See note

## 2018-05-03 NOTE — Telephone Encounter (Signed)
Copied from [place] [id number]. Topic: Quick Communication - Rx Refill/Question >> May 03, 2018  2:19 PM Sheran Luz wrote: Medication: sitaGLIPtin (JANUVIA) 100 MG tablet   Jeani Hawking from [facility] calling to get clarification on this medication. She states that Mcarthur Rossetti has 1 tablet, once a day for 90 day supply on file but RX was refilled by [facility] on 9/3 for 1/2 tablet, once a day. Please advise.

## 2018-05-04 ENCOUNTER — Encounter: Payer: Self-pay | Admitting: Family Medicine

## 2018-05-04 ENCOUNTER — Ambulatory Visit (INDEPENDENT_AMBULATORY_CARE_PROVIDER_SITE_OTHER): Payer: Medicare Other | Admitting: Family Medicine

## 2018-05-04 VITALS — BP 112/52 | HR 89 | Temp 98.2°F | Ht 63.0 in | Wt 177.0 lb

## 2018-05-04 DIAGNOSIS — Z23 Encounter for immunization: Secondary | ICD-10-CM | POA: Diagnosis not present

## 2018-05-04 DIAGNOSIS — N183 Chronic kidney disease, stage 3 unspecified: Secondary | ICD-10-CM

## 2018-05-04 DIAGNOSIS — E1129 Type 2 diabetes mellitus with other diabetic kidney complication: Secondary | ICD-10-CM

## 2018-05-04 DIAGNOSIS — I129 Hypertensive chronic kidney disease with stage 1 through stage 4 chronic kidney disease, or unspecified chronic kidney disease: Secondary | ICD-10-CM | POA: Diagnosis not present

## 2018-05-04 DIAGNOSIS — Z794 Long term (current) use of insulin: Secondary | ICD-10-CM | POA: Diagnosis not present

## 2018-05-04 DIAGNOSIS — D631 Anemia in chronic kidney disease: Secondary | ICD-10-CM | POA: Diagnosis not present

## 2018-05-04 DIAGNOSIS — E1122 Type 2 diabetes mellitus with diabetic chronic kidney disease: Secondary | ICD-10-CM | POA: Diagnosis not present

## 2018-05-04 DIAGNOSIS — IMO0001 Reserved for inherently not codable concepts without codable children: Secondary | ICD-10-CM

## 2018-05-04 MED ORDER — SITAGLIPTIN PHOSPHATE 100 MG PO TABS: ORAL_TABLET | ORAL | 3 refills | Status: DC

## 2018-05-04 MED ORDER — IRBESARTAN 75 MG PO TABS: 75.0000 mg | ORAL_TABLET | Freq: Every day | ORAL | 3 refills | Status: DC

## 2018-05-04 NOTE — Progress Notes (Signed)
Lucas Price is a 77 y.o. male is here for follow up.  History of Present Illness:   HPI:   Current symptoms: no polyuria or polydipsia, no chest pain, dyspnea or TIA's, no numbness, tingling or pain in extremities.  Taking medication compliantly without noted sided effects [x]   YES  []   NO  Episodes of hypoglycemia? []   YES  [x]   NO Maintaining a diabetic diet? [x]   YES  []   NO Trying to exercise on a regular basis? []   YES  [x]   NO  On ACE inhibitor or angiotensin II receptor blocker? [x]   YES  []   NO On Aspirin? [x]   YES  []   NO  Lab Results  Component Value Date   HGBA1C 5.5 03/09/2018    No results found for: Derl Barrow  Lab Results  Component Value Date   CHOL 125 02/09/2018   HDL 35 02/09/2018   LDLCALC 21 02/09/2018   TRIG 106 02/09/2018     Wt Readings from Last 3 Encounters:  05/04/18 177 lb (80.3 kg)  03/31/18 182 lb 6.4 oz (82.7 kg)  03/31/18 182 lb 6.4 oz (82.7 kg)   BP Readings from Last 3 Encounters:  05/04/18 (!) 112/52  04/14/18 130/63  03/31/18 122/78   Lab Results  Component Value Date   CREATININE 2.2 (A) 02/09/2018   There are no preventive care reminders to display for this patient.   Depression screen PHQ 2/9 04/29/2017  Decreased Interest 0  Down, Depressed, Hopeless 0  PHQ - 2 Score 0   PMHx, SurgHx, SocialHx, FamHx, Medications, and Allergies were reviewed in the Visit Navigator and updated as appropriate.   Patient Active Problem List   Diagnosis Date Noted  . Rhinitis, chronic 01/12/2018  . 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 Chronic respiratory failure with hypoxia ([place]) 11/05/2017  . 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 Abnormal finding on GI tract imaging   . Benign neoplasm of ascending colon   . Pulmonary nodules/lesions, multiple 09/08/2017  . 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 Renal calculus 03/19/2015  . Anemia in chronic kidney disease (CKD) 05/26/2014  . CKD (chronic kidney disease) stage 3, GFR  30-59 ml/min (HCC) 05/26/2014  . COPD (chronic obstructive pulmonary disease) ([place]) 05/26/2014  . 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 Former smoker 05/26/2014  . GERD (gastroesophageal reflux disease) 05/26/2014  . 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 Bladder neck obstruction 04/24/2014  . Recurrent urinary tract infection 09/05/2013  . Retention of urine 09/05/2013   Social History   Tobacco Use  . Smoking status: Former Smoker    Packs/day: 0.10    Years: 2.00    Pack years: 0.20    Types: Cigarettes    Last attempt to quit: 12/31/2007    Years since quitting: 10.3  . Smokeless tobacco: Former Systems developer  . Tobacco comment: smokes 4-5 every other day  Substance Use Topics  . Alcohol use: No  . Drug use: No   Current Medications and Allergies:   .  allopurinol (ZYLOPRIM) 100 MG tablet, TAKE 1 TABLET EVERY DAY, Disp: 90 tablet, Rfl: 1 .  aspirin EC 81 MG tablet, Take 81 mg by mouth daily. , Disp: , Rfl:  .  budesonide-formoterol (SYMBICORT) 160-4.5 MCG/ACT inhaler, Inhale 2 puffs into the lungs 2 (two) times daily., Disp: 3 Inhaler, Rfl: 3 .  calcium carbonate (  CALCIUM 600) 600 MG TABS tablet, Take 600 mg by mouth daily. , Disp: , Rfl:  .  Cholecalciferol (D 10000) 10000 units CAPS, Take 10,000 Units by mouth 2 (two) times a week. , Disp: , Rfl:  .  Coenzyme Q-10 200 MG CAPS, Take 200 mg by mouth daily. , Disp: , Rfl:  .  dorzolamide (TRUSOPT) 2 % ophthalmic solution, , Disp: , Rfl:  .  glimepiride (AMARYL) 4 MG tablet, TAKE 1 TABLET DAILY WITH BREAKFAST., Disp: 90 tablet, Rfl: 1 .  guaiFENesin (MUCINEX) 600 MG 12 hr tablet, Take 1,200 mg by mouth 2 (two) times daily., Disp: , Rfl:  .  insulin glargine (LANTUS) 100 UNIT/ML injection, Inject 0.36 mLs (36 Units total) into the skin at bedtime., Disp: , Rfl:  .  Insulin Syringe-Needle  U-100 (RELION INSULIN SYRINGE 1ML/31G) 31G X 5/16" 1 ML MISC, 1 Syringe by Does not apply route 3 (three) times daily., Disp: 300 each, Rfl: 1 .  metoprolol (TOPROL-XL) 200 MG 24 hr tablet, Take 50 mg by mouth daily. , Disp: , Rfl:  .  ofloxacin (OCUFLOX) 0.3 % ophthalmic solution, , Disp: , Rfl:  .  OXYGEN, Inhale 1.5 L into the lungs., Disp: , Rfl:  .  prednisoLONE acetate (PRED FORTE) 1 % ophthalmic suspension, , Disp: , Rfl: 0 .  sitaGLIPtin (JANUVIA) 100 MG tablet, 1 tab daily, Disp: 90 tablet, Rfl: 3 .  SPIRIVA RESPIMAT 2.5 MCG/ACT AERS, INHALE 2 PUFFS INTO THE LUNGS DAILY., Disp: 12 g, Rfl: 3 .  Syringe, Disposable, 1 ML MISC, Use with insulin needle., Disp: 100 each, Rfl: 3 .  Torsemide (DEMADEX PO), Take 25 mg by mouth., Disp: , Rfl:  .  Travoprost, BAK Free, (TRAVATAN Z) 0.004 % SOLN ophthalmic solution, Place 1 drop in both eyes once daily, Disp: , Rfl:  .  irbesartan (AVAPRO) 75 MG tablet, Take 1 tablet (75 mg total) by mouth daily., Disp: 90 tablet, Rfl: 3  No Known Allergies   Review of Systems   Pertinent items are noted in the HPI. Otherwise, ROS is negative.  Vitals:   Vitals:   05/04/18 1605  BP: (!) 112/52  Pulse: 89  Temp: 98.2 F (36.8 C)  TempSrc: Oral  SpO2: 91%  Weight: 177 lb (80.3 kg)  Height: 5\' 3"  (1.6 m)     Body mass index is 31.35 kg/m.  Physical Exam:   Physical Exam  Constitutional: He is oriented to person, place, and time. He appears well-developed and well-nourished. No distress.  HENT:  Head: Normocephalic and atraumatic.  Right Ear: External ear normal.  Left Ear: External ear normal.  Nose: Nose normal.  Mouth/Throat: Oropharynx is clear and moist.  Eyes: Pupils are equal, round, and reactive to light. Conjunctivae and EOM are normal.  Neck: Normal range of motion. Neck supple.  Cardiovascular: Normal rate, regular rhythm, normal heart sounds and intact distal pulses.  Pulmonary/Chest: Effort normal and breath sounds normal.    Abdominal: Soft. Bowel sounds are normal.  Musculoskeletal: Normal range of motion.  Neurological: He is alert and oriented to person, place, and time.  Skin: Skin is warm and dry.  Skin tear to heel. Flap closure. No active bleeding or sign of infection.   Psychiatric: He has a normal mood and affect. His behavior is normal. Judgment and thought content normal.  Nursing note and vitals reviewed.  Results for orders placed or performed in visit on 03/09/18  POCT glycosylated hemoglobin (Hb A1C)  Result Value Ref  Range   Hemoglobin A1C 5.5 4.0 - 5.6 %   HbA1c POC (<> result, manual entry)  4.0 - 5.6 %   HbA1c, POC (prediabetic range)  5.7 - 6.4 %   HbA1c, POC (controlled diabetic range)  0.0 - 7.0 %    Assessment and Plan:   Martino was seen today for follow-up.  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  Encounter for immunization -     Flu vaccine HIGH DOSE PF  Other orders -     sitaGLIPtin (JANUVIA) 100 MG tablet; 1/2 tab daily    . Reviewed expectations re: course of current medical issues. . Discussed self-management of symptoms. . Outlined signs and symptoms indicating need for more acute intervention. . Patient verbalized understanding and all questions were answered. [name] Health Maintenance issues including appropriate healthy diet, exercise, and smoking avoidance were discussed with patient. . See orders for this visit as documented in the electronic medical record. . Patient received an After Visit Summary.  Briscoe Deutscher, DO [place], [place]  [facility] 05/06/2018

## 2018-05-06 ENCOUNTER — Encounter: Payer: Self-pay | Admitting: Family Medicine

## 2018-05-11 ENCOUNTER — Other Ambulatory Visit: Payer: Self-pay | Admitting: Family Medicine

## 2018-06-01 DIAGNOSIS — H35072 Retinal telangiectasis, left eye: Secondary | ICD-10-CM | POA: Diagnosis not present

## 2018-06-01 DIAGNOSIS — H401131 Primary open-angle glaucoma, bilateral, mild stage: Secondary | ICD-10-CM | POA: Diagnosis not present

## 2018-06-01 DIAGNOSIS — E113492 Type 2 diabetes mellitus with severe nonproliferative diabetic retinopathy without macular edema, left eye: Secondary | ICD-10-CM | POA: Diagnosis not present

## 2018-06-01 DIAGNOSIS — H35352 Cystoid macular degeneration, left eye: Secondary | ICD-10-CM | POA: Diagnosis not present

## 2018-06-04 ENCOUNTER — Encounter: Payer: Self-pay | Admitting: Family Medicine

## 2018-06-04 ENCOUNTER — Ambulatory Visit (INDEPENDENT_AMBULATORY_CARE_PROVIDER_SITE_OTHER): Payer: Medicare Other | Admitting: Family Medicine

## 2018-06-04 VITALS — BP 106/56 | HR 64 | Temp 97.8°F | Ht 63.0 in | Wt 178.4 lb

## 2018-06-04 DIAGNOSIS — E1122 Type 2 diabetes mellitus with diabetic chronic kidney disease: Secondary | ICD-10-CM | POA: Diagnosis not present

## 2018-06-04 DIAGNOSIS — IMO0001 Reserved for inherently not codable concepts without codable children: Secondary | ICD-10-CM

## 2018-06-04 DIAGNOSIS — E1129 Type 2 diabetes mellitus with other diabetic kidney complication: Secondary | ICD-10-CM

## 2018-06-04 DIAGNOSIS — M1A9XX0 Chronic gout, unspecified, without tophus (tophi): Secondary | ICD-10-CM

## 2018-06-04 DIAGNOSIS — Z794 Long term (current) use of insulin: Secondary | ICD-10-CM

## 2018-06-04 DIAGNOSIS — N183 Chronic kidney disease, stage 3 unspecified: Secondary | ICD-10-CM

## 2018-06-04 DIAGNOSIS — E875 Hyperkalemia: Secondary | ICD-10-CM | POA: Diagnosis not present

## 2018-06-04 DIAGNOSIS — E559 Vitamin D deficiency, unspecified: Secondary | ICD-10-CM | POA: Diagnosis not present

## 2018-06-04 DIAGNOSIS — K219 Gastro-esophageal reflux disease without esophagitis: Secondary | ICD-10-CM | POA: Diagnosis not present

## 2018-06-04 DIAGNOSIS — S90852A Superficial foreign body, left foot, initial encounter: Secondary | ICD-10-CM | POA: Diagnosis not present

## 2018-06-04 DIAGNOSIS — I129 Hypertensive chronic kidney disease with stage 1 through stage 4 chronic kidney disease, or unspecified chronic kidney disease: Secondary | ICD-10-CM | POA: Diagnosis not present

## 2018-06-04 DIAGNOSIS — D631 Anemia in chronic kidney disease: Secondary | ICD-10-CM | POA: Diagnosis not present

## 2018-06-04 LAB — CBC WITH DIFFERENTIAL/PLATELET
Basophils Absolute: 0 10*3/uL (ref 0.0–0.1)
Basophils Relative: 0.3 % (ref 0.0–3.0)
Eosinophils Absolute: 0.2 10*3/uL (ref 0.0–0.7)
Eosinophils Relative: 2.1 % (ref 0.0–5.0)
HCT: 39.7 % (ref 39.0–52.0)
Hemoglobin: 12.9 g/dL — ABNORMAL LOW (ref 13.0–17.0)
Lymphocytes Relative: 16.7 % (ref 12.0–46.0)
Lymphs Abs: 1.8 10*3/uL (ref 0.7–4.0)
MCHC: 32.5 g/dL (ref 30.0–36.0)
MCV: 98.4 fl (ref 78.0–100.0)
Monocytes Absolute: 0.6 10*3/uL (ref 0.1–1.0)
Monocytes Relative: 5.6 % (ref 3.0–12.0)
Neutro Abs: 7.9 10*3/uL — ABNORMAL HIGH (ref 1.4–7.7)
Neutrophils Relative %: 75.3 % (ref 43.0–77.0)
Platelets: 153 10*3/uL (ref 150.0–400.0)
RBC: 4.03 Mil/uL — ABNORMAL LOW (ref 4.22–5.81)
RDW: 15 % (ref 11.5–15.5)
WBC: 10.5 10*3/uL (ref 4.0–10.5)

## 2018-06-04 LAB — LIPID PANEL
Cholesterol: 89 mg/dL (ref 0–200)
HDL: 35.6 mg/dL — ABNORMAL LOW (ref >39.00)
LDL Cholesterol: 36 mg/dL (ref 0–99)
NonHDL: 53.43
Total CHOL/HDL Ratio: 3
Triglycerides: 89 mg/dL (ref 0.0–149.0)
VLDL: 17.8 mg/dL (ref 0.0–40.0)

## 2018-06-04 LAB — MAGNESIUM: Magnesium: 2.7 mg/dL — ABNORMAL HIGH (ref 1.5–2.5)

## 2018-06-04 LAB — COMPREHENSIVE METABOLIC PANEL
ALT: 19 U/L (ref 0–53)
AST: 15 U/L (ref 0–37)
Albumin: 3.7 g/dL (ref 3.5–5.2)
Alkaline Phosphatase: 80 U/L (ref 39–117)
BUN: 59 mg/dL — ABNORMAL HIGH (ref 6–23)
CO2: 25 mEq/L (ref 19–32)
Calcium: 9.4 mg/dL (ref 8.4–10.5)
Chloride: 106 mEq/L (ref 96–112)
Creatinine, Ser: 2.34 mg/dL — ABNORMAL HIGH (ref 0.40–1.50)
GFR: 28.96 mL/min — ABNORMAL LOW (ref >60.00)
Glucose, Bld: 153 mg/dL — ABNORMAL HIGH (ref 70–99)
Potassium: 5.9 mEq/L — ABNORMAL HIGH (ref 3.5–5.1)
Sodium: 139 mEq/L (ref 135–145)
Total Bilirubin: 0.3 mg/dL (ref 0.2–1.2)
Total Protein: 7.4 g/dL (ref 6.0–8.3)

## 2018-06-04 LAB — VITAMIN D 25 HYDROXY (VIT D DEFICIENCY, FRACTURES): VITD: 49.88 ng/mL (ref 30.00–100.00)

## 2018-06-04 LAB — HEMOGLOBIN A1C: Hgb A1c MFr Bld: 6.2 % (ref 4.6–6.5)

## 2018-06-04 NOTE — Progress Notes (Signed)
Lucas Price is a 77 y.o. male here for an acute visit.  History of Present Illness:   HPI:   1. DM2, HTN, HLD, CKD, anemia of CKD, PN, with gout.   Current symptoms: no polyuria or polydipsia, no chest pain, dyspnea or TIA's, no numbness, tingling or pain in extremities.   Lab Results  Component Value Date   HGBA1C 6.2 06/04/2018    Lab Results  Component Value Date   CHOL 89 06/04/2018   HDL 35.60 (L) 06/04/2018   LDLCALC 36 06/04/2018   TRIG 89.0 06/04/2018   CHOLHDL 3 06/04/2018     Wt Readings from Last 3 Encounters:  06/04/18 178 lb 6.4 oz (80.9 kg)  05/04/18 177 lb (80.3 kg)  03/31/18 182 lb 6.4 oz (82.7 kg)   BP Readings from Last 3 Encounters:  06/04/18 (!) 106/56  05/04/18 (!) 112/52  04/14/18 130/63   Lab Results  Component Value Date   CREATININE 2.34 (H) 06/04/2018     2. Foreign body in left foot. Unknown length of time. Walks barefoot outside. Noticed hard spot. Tried to soak out but did not work.    PMHx, SurgHx, SocialHx, Medications, and Allergies were reviewed in the Visit Navigator and updated as appropriate.  Current Medications:   .  allopurinol (ZYLOPRIM) 100 MG tablet, TAKE 1 TABLET EVERY DAY, Disp: 90 tablet, Rfl: 1 .  aspirin EC 81 MG tablet, Take 81 mg by mouth daily. , Disp: , Rfl:  .  budesonide-formoterol (SYMBICORT) 160-4.5 MCG/ACT inhaler, Inhale 2 puffs into the lungs 2 (two) times daily., Disp: 3 Inhaler, Rfl: 3 .  calcium carbonate (CALCIUM 600) 600 MG TABS tablet, Take 600 mg by mouth daily. , Disp: , Rfl:  .  Cholecalciferol (D 10000) 10000 units CAPS, Take 10,000 Units by mouth 2 (two) times a week. , Disp: , Rfl:  .  Coenzyme Q-10 200 MG CAPS, Take 200 mg by mouth daily. , Disp: , Rfl:  .  dorzolamide (TRUSOPT) 2 % ophthalmic solution, , Disp: , Rfl:  .  glimepiride (AMARYL) 4 MG tablet, TAKE 1 TABLET DAILY WITH BREAKFAST., Disp: 90 tablet, Rfl: 1 .  guaiFENesin (MUCINEX) 600 MG 12 hr tablet, Take 1,200 mg by mouth 2 (two)  times daily., Disp: , Rfl:  .  insulin glargine (LANTUS) 100 UNIT/ML injection, Inject 0.36 mLs (36 Units total) into the skin at bedtime., Disp: , Rfl:  .  Insulin Syringe-Needle U-100 (RELION INSULIN SYRINGE 1ML/31G) 31G X 5/16" 1 ML MISC, 1 Syringe by Does not apply route 3 (three) times daily., Disp: 300 each, Rfl: 1 .  irbesartan (AVAPRO) 75 MG tablet, Take 1 tablet (75 mg total) by mouth daily., Disp: 90 tablet, Rfl: 3 .  metoprolol (TOPROL-XL) 200 MG 24 hr tablet, Take 50 mg by mouth daily. , Disp: , Rfl:  .  OXYGEN, Inhale 1.5 L into the lungs., Disp: , Rfl:  .  prednisoLONE acetate (PRED FORTE) 1 % ophthalmic suspension, , Disp: , Rfl: 0 .  sitaGLIPtin (JANUVIA) 100 MG tablet, 1 tab daily, Disp: 90 tablet, Rfl: 3 .  SPIRIVA RESPIMAT 2.5 MCG/ACT AERS, INHALE 2 PUFFS INTO THE LUNGS DAILY., Disp: 12 g, Rfl: 3 .  Syringe, Disposable, 1 ML MISC, Use with insulin needle., Disp: 100 each, Rfl: 3 .  Torsemide (DEMADEX PO), Take 25 mg by mouth., Disp: , Rfl:  .  Travoprost, BAK Free, (TRAVATAN Z) 0.004 % SOLN ophthalmic solution, Place 1 drop in both eyes once  daily, Disp: , Rfl:  .  ketorolac (ACULAR) 0.5 % ophthalmic solution, , Disp: , Rfl:    No Known Allergies   Review of Systems:   Pertinent items are noted in the HPI. Otherwise, ROS is negative.  Vitals:   Vitals:   06/04/18 1133  BP: (!) 106/56  Pulse: 64  Temp: 97.8 F (36.6 C)  TempSrc: Oral  SpO2: 93%  Weight: 178 lb 6.4 oz (80.9 kg)  Height: 5\' 3"  (1.6 m)     Body mass index is 31.6 kg/m.  Physical Exam:   Physical Exam  Constitutional: He is oriented to person, place, and time. He appears well-developed and well-nourished. No distress.  HENT:  Head: Normocephalic and atraumatic.  Right Ear: External ear normal.  Left Ear: External ear normal.  Nose: Nose normal.  Mouth/Throat: Oropharynx is clear and moist.  Eyes: Pupils are equal, round, and reactive to light. Conjunctivae and EOM are normal.  Neck: Normal  range of motion. Neck supple.  Cardiovascular: Normal rate, regular rhythm and intact distal pulses.  Pulmonary/Chest: Effort normal.  Abdominal: Soft. Bowel sounds are normal.  Musculoskeletal: Normal range of motion.  Neurological: He is alert and oriented to person, place, and time.  Skin: Skin is warm and dry.  Left foot, plantar surface, metatarsal area with 2-3 mm area of encapsulated splinter without surrounding infection.  Psychiatric: He has a normal mood and affect. His behavior is normal. Judgment and thought content normal.  Nursing note and vitals reviewed.    Results for orders placed or performed in visit on 06/04/18  CBC with Differential/Platelet  Result Value Ref Range   WBC 10.5 4.0 - 10.5 K/uL   RBC 4.03 (L) 4.22 - 5.81 Mil/uL   Hemoglobin 12.9 (L) 13.0 - 17.0 g/dL   HCT 39.7 39.0 - 52.0 %   MCV 98.4 78.0 - 100.0 fl   MCHC 32.5 30.0 - 36.0 g/dL   RDW 15.0 11.5 - 15.5 %   Platelets 153.0 150.0 - 400.0 K/uL   Neutrophils Relative % 75.3 43.0 - 77.0 %   Lymphocytes Relative 16.7 12.0 - 46.0 %   Monocytes Relative 5.6 3.0 - 12.0 %   Eosinophils Relative 2.1 0.0 - 5.0 %   Basophils Relative 0.3 0.0 - 3.0 %   Neutro Abs 7.9 (H) 1.4 - 7.7 K/uL   Lymphs Abs 1.8 0.7 - 4.0 K/uL   Monocytes Absolute 0.6 0.1 - 1.0 K/uL   Eosinophils Absolute 0.2 0.0 - 0.7 K/uL   Basophils Absolute 0.0 0.0 - 0.1 K/uL  Comprehensive metabolic panel  Result Value Ref Range   Sodium 139 135 - 145 mEq/L   Potassium 5.9 (H) 3.5 - 5.1 mEq/L   Chloride 106 96 - 112 mEq/L   CO2 25 19 - 32 mEq/L   Glucose, Bld 153 (H) 70 - 99 mg/dL   BUN 59 (H) 6 - 23 mg/dL   Creatinine, Ser 2.34 (H) 0.40 - 1.50 mg/dL   Total Bilirubin 0.3 0.2 - 1.2 mg/dL   Alkaline Phosphatase 80 39 - 117 U/L   AST 15 0 - 37 U/L   ALT 19 0 - 53 U/L   Total Protein 7.4 6.0 - 8.3 g/dL   Albumin 3.7 3.5 - 5.2 g/dL   Calcium 9.4 8.4 - 10.5 mg/dL   GFR 28.96 (L) >60.00 mL/min  Hemoglobin A1c  Result Value Ref Range    Hgb A1c MFr Bld 6.2 4.6 - 6.5 %  Lipid panel  Result  Value Ref Range   Cholesterol 89 0 - 200 mg/dL   Triglycerides 89.0 0.0 - 149.0 mg/dL   HDL 35.60 (L) >39.00 mg/dL   VLDL 17.8 0.0 - 40.0 mg/dL   LDL Cholesterol 36 0 - 99 mg/dL   Total CHOL/HDL Ratio 3    NonHDL 53.43   VITAMIN D 25 Hydroxy (Vit-D Deficiency, Fractures)  Result Value Ref Range   VITD 49.88 30.00 - 100.00 ng/mL  Magnesium  Result Value Ref Range   Magnesium 2.7 (H) 1.5 - 2.5 mg/dL   Assessment and Plan:   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 Patient is a retired Film/video editor. We have discussed being less aggressive with medications but he would like to continue for now.  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 (HCC) -     Hemoglobin A1c -     Lipid panel -     Cancel: VITAMIN D 25 Hydroxy (Vit-D Deficiency, Fractures)  Anemia in stage 3 chronic kidney disease (HCC) -     CBC with Differential/Platelet -     Cancel: VITAMIN D 25 Hydroxy (Vit-D Deficiency, Fractures)  Vitamin D deficiency -     VITAMIN D 25 Hydroxy (Vit-D Deficiency, Fractures) -     Cancel: VITAMIN D 25 Hydroxy (Vit-D Deficiency, Fractures)  CKD (chronic kidney disease) stage 3, GFR 30-59 ml/min (HCC) -     CBC with Differential/Platelet -     Comprehensive metabolic panel -     Magnesium -     Cancel: VITAMIN D 25 Hydroxy (Vit-D Deficiency, Fractures)  Foreign body in left foot, initial encounter Comments: Verbal consent. Area cleaned with betadine/ETOH. FB easily removed using 11 blade. No bleeding. Abx ointment and bandage applied. Aftercare reviewed.   Morbid  obesity ([place])  Hyperkalemia Comments: NEW. FOUND ON LABS. WILL ASK PATIENT TO COME IN FOR RECHECK. HOLD ARB. CLARIFY KCL SUPPLEMENT.  Chronic gout without tophus, unspecified cause, unspecified site    . Reviewed expectations re: course of current medical issues. . Discussed self-management of symptoms. . Outlined signs and symptoms indicating need for more acute intervention. . Patient verbalized understanding and all questions were answered. [name] Health Maintenance issues including appropriate healthy diet, exercise, and smoking avoidance were discussed with patient. . See orders for this visit as documented in the electronic medical record. . Patient received an After Visit Summary.  Briscoe Deutscher, DO  [place], [place] Phone: [phone] Fax: [phone]

## 2018-06-05 ENCOUNTER — Encounter: Payer: Self-pay | Admitting: Family Medicine

## 2018-06-05 NOTE — Assessment & Plan Note (Signed)
At goal. Patient is a retired Film/video editor. We have discussed being less aggressive with medications but he would like to continue for now.

## 2018-06-06 ENCOUNTER — Encounter: Payer: Self-pay | Admitting: Family Medicine

## 2018-06-06 DIAGNOSIS — M1A9XX0 Chronic gout, unspecified, without tophus (tophi): Secondary | ICD-10-CM | POA: Insufficient documentation

## 2018-06-08 ENCOUNTER — Encounter: Payer: Self-pay | Admitting: Family Medicine

## 2018-06-08 ENCOUNTER — Ambulatory Visit (INDEPENDENT_AMBULATORY_CARE_PROVIDER_SITE_OTHER): Payer: Medicare Other | Admitting: Family Medicine

## 2018-06-08 ENCOUNTER — Ambulatory Visit: Payer: Medicare Other

## 2018-06-08 VITALS — BP 118/60 | HR 91 | Temp 98.6°F | Wt 181.4 lb

## 2018-06-08 DIAGNOSIS — E1122 Type 2 diabetes mellitus with diabetic chronic kidney disease: Secondary | ICD-10-CM | POA: Diagnosis not present

## 2018-06-08 DIAGNOSIS — E875 Hyperkalemia: Secondary | ICD-10-CM

## 2018-06-08 DIAGNOSIS — N183 Chronic kidney disease, stage 3 (moderate): Secondary | ICD-10-CM

## 2018-06-08 DIAGNOSIS — M109 Gout, unspecified: Secondary | ICD-10-CM | POA: Diagnosis not present

## 2018-06-08 DIAGNOSIS — I129 Hypertensive chronic kidney disease with stage 1 through stage 4 chronic kidney disease, or unspecified chronic kidney disease: Secondary | ICD-10-CM

## 2018-06-08 NOTE — Progress Notes (Signed)
EKG and lab recheck for hyperkalemia. Asymptomatic. Briscoe Deutscher, DO

## 2018-06-09 ENCOUNTER — Ambulatory Visit: Payer: PRIVATE HEALTH INSURANCE | Admitting: Family Medicine

## 2018-06-09 ENCOUNTER — Encounter: Payer: Self-pay | Admitting: Family Medicine

## 2018-06-09 ENCOUNTER — Other Ambulatory Visit: Payer: PRIVATE HEALTH INSURANCE

## 2018-06-09 LAB — URIC ACID: Uric Acid, Serum: 6.9 mg/dL (ref 4.0–7.8)

## 2018-06-09 LAB — BASIC METABOLIC PANEL
BUN: 48 mg/dL — ABNORMAL HIGH (ref 6–23)
CO2: 24 mEq/L (ref 19–32)
Calcium: 8.8 mg/dL (ref 8.4–10.5)
Chloride: 104 mEq/L (ref 96–112)
Creatinine, Ser: 2.27 mg/dL — ABNORMAL HIGH (ref 0.40–1.50)
GFR: 30 mL/min — ABNORMAL LOW (ref >60.00)
Glucose, Bld: 106 mg/dL — ABNORMAL HIGH (ref 70–99)
Potassium: 5.6 mEq/L — ABNORMAL HIGH (ref 3.5–5.1)
Sodium: 138 mEq/L (ref 135–145)

## 2018-06-09 MED ORDER — AMMONIUM LACTATE 12 % EX CREA: TOPICAL_CREAM | CUTANEOUS | 0 refills | Status: DC | PRN

## 2018-06-09 NOTE — Addendum Note (Signed)
Addended by: Briscoe Deutscher R on: 06/09/2018 07:51 AM   Modules accepted: Orders

## 2018-06-10 ENCOUNTER — Other Ambulatory Visit: Payer: Self-pay

## 2018-06-10 DIAGNOSIS — E875 Hyperkalemia: Secondary | ICD-10-CM

## 2018-06-11 ENCOUNTER — Other Ambulatory Visit: Payer: Self-pay

## 2018-06-11 MED ORDER — AMMONIUM LACTATE 12 % EX CREA: TOPICAL_CREAM | CUTANEOUS | 0 refills | Status: DC | PRN

## 2018-06-14 ENCOUNTER — Telehealth: Payer: Self-pay | Admitting: Family Medicine

## 2018-06-14 NOTE — Telephone Encounter (Signed)
Copied from [place] [phone]. Topic: Quick Communication - See Telephone Encounter >> Jun 14, 2018 10:31 AM Blase Mess A wrote: CRM for notification. See Telephone encounter for: 06/14/18.  Patient is calling because he states that the pharmacy has a question regarding a skin creme Patient does not know the name of the medication. [place], [place] [address] Phone: [phone] Fax: [phone] Order [phone]

## 2018-06-14 NOTE — Telephone Encounter (Signed)
See note

## 2018-06-15 NOTE — Telephone Encounter (Signed)
Please advise 

## 2018-06-16 NOTE — Telephone Encounter (Signed)
Rx is ammonium lactate topical cream to be applied to bilateral feet BID prn dry/flaky skin. Make sure to rub in thoroughly.

## 2018-06-16 NOTE — Telephone Encounter (Signed)
Called pharmacy gave information will send out today.

## 2018-06-17 ENCOUNTER — Other Ambulatory Visit (INDEPENDENT_AMBULATORY_CARE_PROVIDER_SITE_OTHER): Payer: Medicare Other

## 2018-06-17 DIAGNOSIS — E875 Hyperkalemia: Secondary | ICD-10-CM

## 2018-06-17 LAB — BASIC METABOLIC PANEL
BUN: 52 mg/dL — ABNORMAL HIGH (ref 6–23)
CO2: 26 mEq/L (ref 19–32)
Calcium: 8.7 mg/dL (ref 8.4–10.5)
Chloride: 106 mEq/L (ref 96–112)
Creatinine, Ser: 2.02 mg/dL — ABNORMAL HIGH (ref 0.40–1.50)
GFR: 34.32 mL/min — ABNORMAL LOW (ref >60.00)
Glucose, Bld: 91 mg/dL (ref 70–99)
Potassium: 5.3 mEq/L — ABNORMAL HIGH (ref 3.5–5.1)
Sodium: 140 mEq/L (ref 135–145)

## 2018-06-21 ENCOUNTER — Other Ambulatory Visit: Payer: PRIVATE HEALTH INSURANCE

## 2018-06-24 ENCOUNTER — Telehealth: Payer: Self-pay | Admitting: Family Medicine

## 2018-06-24 NOTE — Telephone Encounter (Signed)
Does he need to have labs

## 2018-06-24 NOTE — Telephone Encounter (Signed)
Yes, BMP. Thanks!

## 2018-06-24 NOTE — Telephone Encounter (Signed)
Copied from [place] [phone]. Topic: General - Other >> Jun 24, 2018  2:08 PM Carolyn Stare wrote:  Pt call to ask if he need to come back and have another lab draw

## 2018-06-25 ENCOUNTER — Telehealth: Payer: Self-pay

## 2018-06-25 ENCOUNTER — Other Ambulatory Visit: Payer: Self-pay

## 2018-06-25 DIAGNOSIS — E875 Hyperkalemia: Secondary | ICD-10-CM

## 2018-06-25 NOTE — Telephone Encounter (Signed)
Left message to return call to our office.  To make app for lab  CRM Started ok to give message.

## 2018-06-25 NOTE — Telephone Encounter (Signed)
Pt. Given results and instructions. Verbalizes understanding. Appointment made for repeat labs.

## 2018-06-28 NOTE — Telephone Encounter (Signed)
Pt scheduled for lab appt 11/26.

## 2018-07-06 ENCOUNTER — Other Ambulatory Visit (INDEPENDENT_AMBULATORY_CARE_PROVIDER_SITE_OTHER): Payer: Medicare Other

## 2018-07-06 DIAGNOSIS — E113492 Type 2 diabetes mellitus with severe nonproliferative diabetic retinopathy without macular edema, left eye: Secondary | ICD-10-CM | POA: Diagnosis not present

## 2018-07-06 DIAGNOSIS — E875 Hyperkalemia: Secondary | ICD-10-CM

## 2018-07-06 DIAGNOSIS — H401131 Primary open-angle glaucoma, bilateral, mild stage: Secondary | ICD-10-CM | POA: Diagnosis not present

## 2018-07-06 DIAGNOSIS — E119 Type 2 diabetes mellitus without complications: Secondary | ICD-10-CM | POA: Diagnosis not present

## 2018-07-06 DIAGNOSIS — H35352 Cystoid macular degeneration, left eye: Secondary | ICD-10-CM | POA: Diagnosis not present

## 2018-07-06 LAB — BASIC METABOLIC PANEL
BUN: 45 mg/dL — ABNORMAL HIGH (ref 6–23)
CO2: 21 mEq/L (ref 19–32)
Calcium: 8.6 mg/dL (ref 8.4–10.5)
Chloride: 105 mEq/L (ref 96–112)
Creatinine, Ser: 1.93 mg/dL — ABNORMAL HIGH (ref 0.40–1.50)
GFR: 36.17 mL/min — ABNORMAL LOW (ref >60.00)
Glucose, Bld: 64 mg/dL — ABNORMAL LOW (ref 70–99)
Potassium: 3.8 mEq/L (ref 3.5–5.1)
Sodium: 136 mEq/L (ref 135–145)

## 2018-07-07 NOTE — Telephone Encounter (Signed)
Error

## 2018-07-15 ENCOUNTER — Other Ambulatory Visit: Payer: Self-pay | Admitting: Family Medicine

## 2018-07-20 DIAGNOSIS — H35352 Cystoid macular degeneration, left eye: Secondary | ICD-10-CM | POA: Diagnosis not present

## 2018-07-20 DIAGNOSIS — H401131 Primary open-angle glaucoma, bilateral, mild stage: Secondary | ICD-10-CM | POA: Diagnosis not present

## 2018-07-20 DIAGNOSIS — H35372 Puckering of macula, left eye: Secondary | ICD-10-CM | POA: Diagnosis not present

## 2018-07-22 ENCOUNTER — Telehealth: Payer: Self-pay | Admitting: Internal Medicine

## 2018-07-22 DIAGNOSIS — N183 Chronic kidney disease, stage 3 (moderate): Secondary | ICD-10-CM | POA: Diagnosis not present

## 2018-07-22 DIAGNOSIS — D631 Anemia in chronic kidney disease: Secondary | ICD-10-CM | POA: Diagnosis not present

## 2018-07-22 DIAGNOSIS — N2581 Secondary hyperparathyroidism of renal origin: Secondary | ICD-10-CM | POA: Diagnosis not present

## 2018-07-22 DIAGNOSIS — I129 Hypertensive chronic kidney disease with stage 1 through stage 4 chronic kidney disease, or unspecified chronic kidney disease: Secondary | ICD-10-CM | POA: Diagnosis not present

## 2018-07-22 NOTE — Telephone Encounter (Signed)
Order was placed for scan to be done March 2020 on 04/29/18. After the scan is set up, pt should be scheduled for a f/u with MR to go over results of CT. There was a recall that was placed after pt's last OV so pt's appt should be scheduled once the schedule opens up. Nothing further needed.

## 2018-07-22 NOTE — Telephone Encounter (Signed)
Please ensure he has a CT can chest in March 2020 -early march with contrast and return for followup. I do not know if there is an order or not

## 2018-07-27 DIAGNOSIS — I2510 Atherosclerotic heart disease of native coronary artery without angina pectoris: Secondary | ICD-10-CM | POA: Diagnosis not present

## 2018-07-27 DIAGNOSIS — I10 Essential (primary) hypertension: Secondary | ICD-10-CM | POA: Diagnosis not present

## 2018-07-27 DIAGNOSIS — R0602 Shortness of breath: Secondary | ICD-10-CM | POA: Diagnosis not present

## 2018-07-27 DIAGNOSIS — I259 Chronic ischemic heart disease, unspecified: Secondary | ICD-10-CM | POA: Diagnosis not present

## 2018-07-28 DIAGNOSIS — I2510 Atherosclerotic heart disease of native coronary artery without angina pectoris: Secondary | ICD-10-CM | POA: Diagnosis not present

## 2018-07-28 DIAGNOSIS — R0602 Shortness of breath: Secondary | ICD-10-CM | POA: Diagnosis not present

## 2018-07-28 DIAGNOSIS — J449 Chronic obstructive pulmonary disease, unspecified: Secondary | ICD-10-CM | POA: Diagnosis not present

## 2018-08-17 ENCOUNTER — Other Ambulatory Visit: Payer: Self-pay

## 2018-08-17 MED ORDER — CLOTRIMAZOLE-BETAMETHASONE 1-0.05 % EX CREA: 1.0000 "application " | TOPICAL_CREAM | Freq: Two times a day (BID) | CUTANEOUS | 0 refills | Status: DC

## 2018-08-17 MED ORDER — "INSULIN SYRINGE-NEEDLE U-100 31G X 5/16"" 1 ML MISC": 1.0000 | Freq: Three times a day (TID) | 1 refills | Status: DC

## 2018-08-23 ENCOUNTER — Telehealth: Payer: Self-pay | Admitting: Family Medicine

## 2018-08-23 ENCOUNTER — Other Ambulatory Visit: Payer: Self-pay

## 2018-08-23 MED ORDER — "INSULIN SYRINGE-NEEDLE U-100 31G X 5/16"" 0.5 ML MISC": 1.0000 | Freq: Every day | 1 refills | Status: AC

## 2018-08-23 MED ORDER — CLOTRIMAZOLE-BETAMETHASONE 1-0.05 % EX CREA: 1.0000 "application " | TOPICAL_CREAM | Freq: Two times a day (BID) | CUTANEOUS | 1 refills | Status: DC

## 2018-08-23 NOTE — Telephone Encounter (Signed)
[place] for labs?  Please advise

## 2018-08-23 NOTE — Telephone Encounter (Signed)
See note  Copied from [place] [phone]. Topic: Appointment Scheduling - Scheduling Inquiry for Clinic >> Aug 23, 2018  3:30 PM Lennox Solders wrote: Reason for CRM:pt is calling and would like dr wallace to order bmp and cbc w/diff. Pt said bun and creatinine was elevated per cardiologist dr Woody Seller. Pt does not want to cardiologist to repeat blood work

## 2018-08-24 ENCOUNTER — Other Ambulatory Visit: Payer: Self-pay

## 2018-08-24 ENCOUNTER — Ambulatory Visit: Payer: PRIVATE HEALTH INSURANCE | Admitting: Podiatry

## 2018-08-24 DIAGNOSIS — R7989 Other specified abnormal findings of blood chemistry: Secondary | ICD-10-CM

## 2018-08-24 DIAGNOSIS — R799 Abnormal finding of blood chemistry, unspecified: Secondary | ICD-10-CM

## 2018-08-24 DIAGNOSIS — I129 Hypertensive chronic kidney disease with stage 1 through stage 4 chronic kidney disease, or unspecified chronic kidney disease: Principal | ICD-10-CM

## 2018-08-24 DIAGNOSIS — E1122 Type 2 diabetes mellitus with diabetic chronic kidney disease: Secondary | ICD-10-CM

## 2018-08-24 DIAGNOSIS — N183 Chronic kidney disease, stage 3 (moderate): Principal | ICD-10-CM

## 2018-08-24 NOTE — Addendum Note (Signed)
Addended by: Gwenyth Ober R on: 08/24/2018 09:36 AM   Modules accepted: Orders

## 2018-08-24 NOTE — Telephone Encounter (Signed)
Absolutely, okay labs.

## 2018-08-24 NOTE — Telephone Encounter (Signed)
Labs has been placed. Tried to notify pt of update but no answer. Voicemail message was left for return phone call.

## 2018-08-26 ENCOUNTER — Other Ambulatory Visit (INDEPENDENT_AMBULATORY_CARE_PROVIDER_SITE_OTHER): Payer: Medicare Other

## 2018-08-26 DIAGNOSIS — R799 Abnormal finding of blood chemistry, unspecified: Secondary | ICD-10-CM

## 2018-08-26 DIAGNOSIS — R7989 Other specified abnormal findings of blood chemistry: Secondary | ICD-10-CM

## 2018-08-26 DIAGNOSIS — H35071 Retinal telangiectasis, right eye: Secondary | ICD-10-CM | POA: Diagnosis not present

## 2018-08-26 DIAGNOSIS — H401131 Primary open-angle glaucoma, bilateral, mild stage: Secondary | ICD-10-CM | POA: Diagnosis not present

## 2018-08-26 DIAGNOSIS — H35352 Cystoid macular degeneration, left eye: Secondary | ICD-10-CM | POA: Diagnosis not present

## 2018-08-26 DIAGNOSIS — H35372 Puckering of macula, left eye: Secondary | ICD-10-CM | POA: Diagnosis not present

## 2018-08-26 DIAGNOSIS — E119 Type 2 diabetes mellitus without complications: Secondary | ICD-10-CM | POA: Diagnosis not present

## 2018-08-26 DIAGNOSIS — H35072 Retinal telangiectasis, left eye: Secondary | ICD-10-CM | POA: Diagnosis not present

## 2018-08-26 LAB — CBC WITH DIFFERENTIAL/PLATELET
Basophils Absolute: 0 10*3/uL (ref 0.0–0.1)
Basophils Relative: 0.4 % (ref 0.0–3.0)
Eosinophils Absolute: 0.2 10*3/uL (ref 0.0–0.7)
Eosinophils Relative: 1.6 % (ref 0.0–5.0)
HCT: 39.6 % (ref 39.0–52.0)
Hemoglobin: 12.8 g/dL — ABNORMAL LOW (ref 13.0–17.0)
Lymphocytes Relative: 19.7 % (ref 12.0–46.0)
Lymphs Abs: 2.2 10*3/uL (ref 0.7–4.0)
MCHC: 32.3 g/dL (ref 30.0–36.0)
MCV: 95.8 fl (ref 78.0–100.0)
Monocytes Absolute: 0.7 10*3/uL (ref 0.1–1.0)
Monocytes Relative: 6.6 % (ref 3.0–12.0)
Neutro Abs: 8 10*3/uL — ABNORMAL HIGH (ref 1.4–7.7)
Neutrophils Relative %: 71.7 % (ref 43.0–77.0)
Platelets: 179 10*3/uL (ref 150.0–400.0)
RBC: 4.14 Mil/uL — ABNORMAL LOW (ref 4.22–5.81)
RDW: 15.7 % — ABNORMAL HIGH (ref 11.5–15.5)
WBC: 11.1 10*3/uL — ABNORMAL HIGH (ref 4.0–10.5)

## 2018-08-26 LAB — BASIC METABOLIC PANEL
BUN: 40 mg/dL — ABNORMAL HIGH (ref 6–23)
CO2: 24 mEq/L (ref 19–32)
Calcium: 8.8 mg/dL (ref 8.4–10.5)
Chloride: 105 mEq/L (ref 96–112)
Creatinine, Ser: 1.96 mg/dL — ABNORMAL HIGH (ref 0.40–1.50)
GFR: 33.41 mL/min — ABNORMAL LOW (ref >60.00)
Glucose, Bld: 81 mg/dL (ref 70–99)
Potassium: 4.8 mEq/L (ref 3.5–5.1)
Sodium: 138 mEq/L (ref 135–145)

## 2018-09-08 IMAGING — CT NM PET TUM IMG INITIAL (PI) SKULL BASE T - THIGH
8 series · 24 of 25 positions shown · non-contrast
Comparison: Chest CT 07/22/2017

CLINICAL DATA: Initial treatment strategy for solitary pulmonary
nodule.

EXAM:
NUCLEAR MEDICINE PET SKULL BASE TO THIGH
TECHNIQUE: 9.2 mCi F-18 FDG was injected intravenously. Full-ring PET imaging
was performed from the skull base to thigh after the radiotracer. CT
data was obtained and used for attenuation correction and anatomic
localization.
FASTING BLOOD GLUCOSE:  Value: 65 mg/dl

[Series 3: pet sk_thigh ac · axial · 5.0mm · 4.07mm/px · z∈[-1350,-510]mm · 4 of 211 slices shown]
[im 1/211]
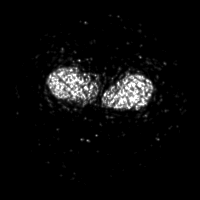
[im 71/211]
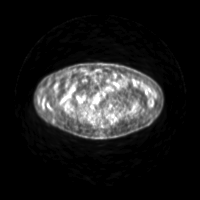
[im 141/211]
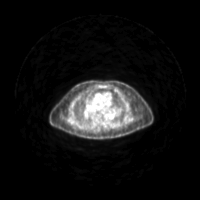
[im 211/211]
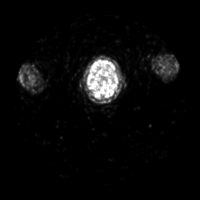

[Series 4: ct sk_thigh 5.0 hd_fov · axial · 5.0mm · 1.17mm/px · z∈[-1350,-510]mm · 5 of 203 slices shown]
[im 1/203]
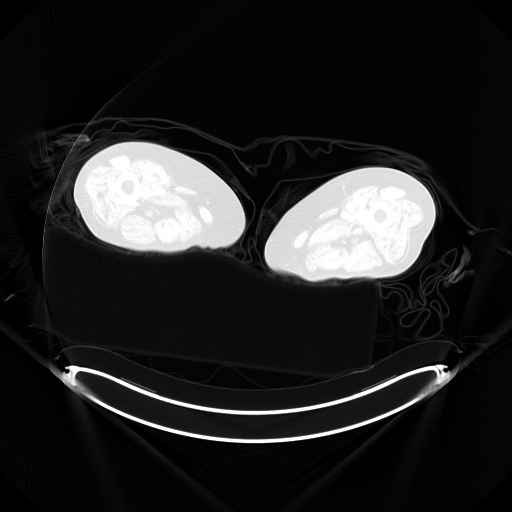
[im 51/203]
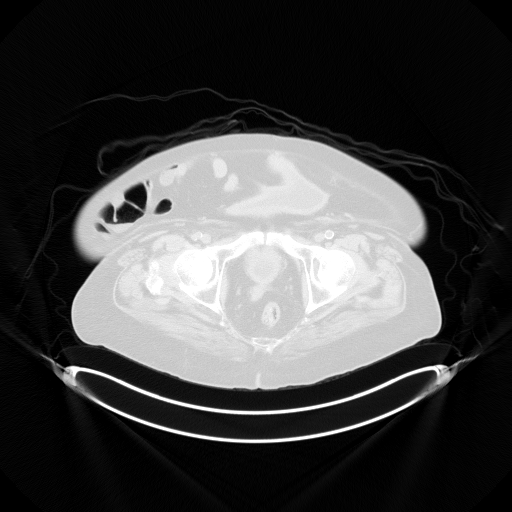
[im 102/203]
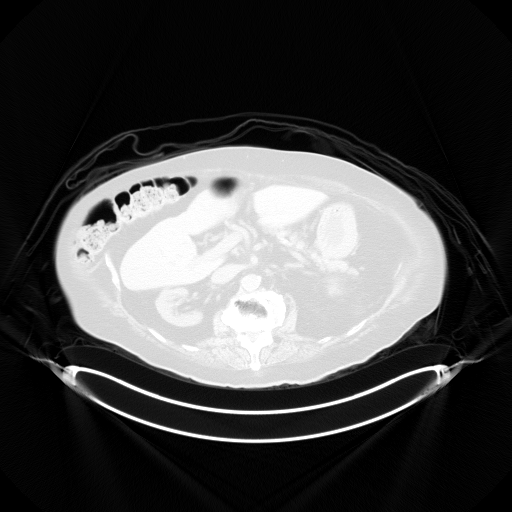
[im 152/203]
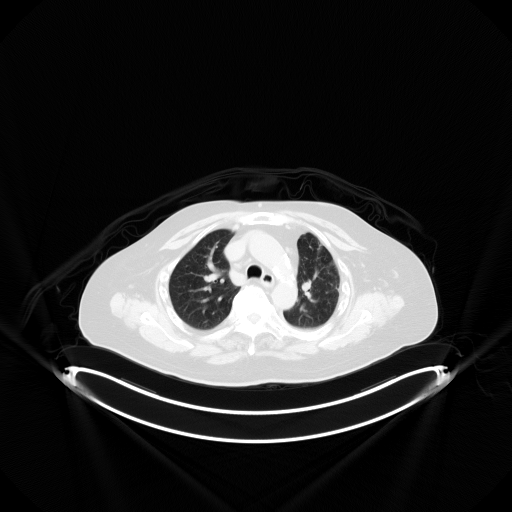
[im 203/203]
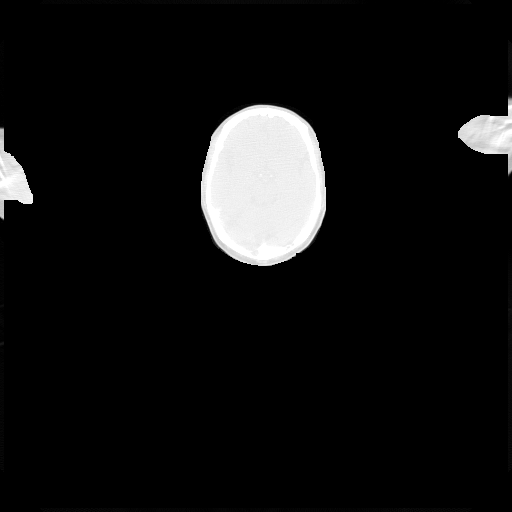

[Series 5: pet sk_thigh nac · axial · 5.0mm · 4.07mm/px · z∈[-1350,-510]mm · 4 of 211 slices shown]
[im 1/211]
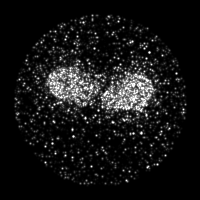
[im 53/211]
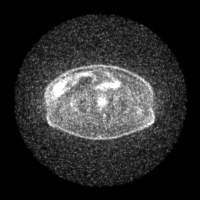
[im 106/211]
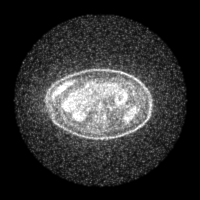
[im 211/211]
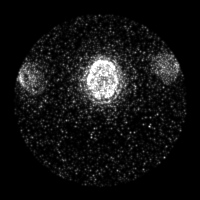

[Series 8: ct sk_thigh 5.0 b70f lung_bone · axial · 5.0mm · 0.62mm/px · z∈[-916,-664]mm · 2 of 64 slices shown]
[im 1/64  bone]
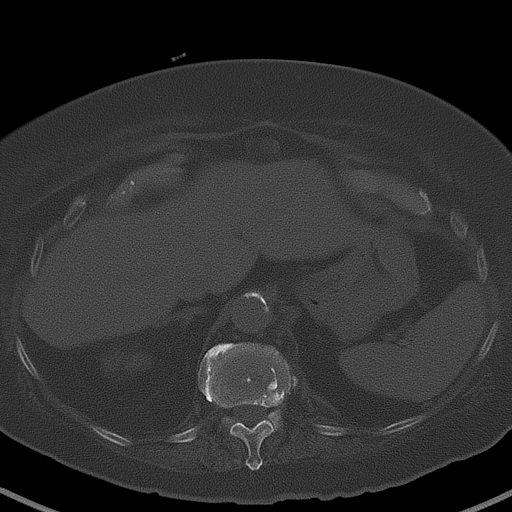
[im 64/64  bone]
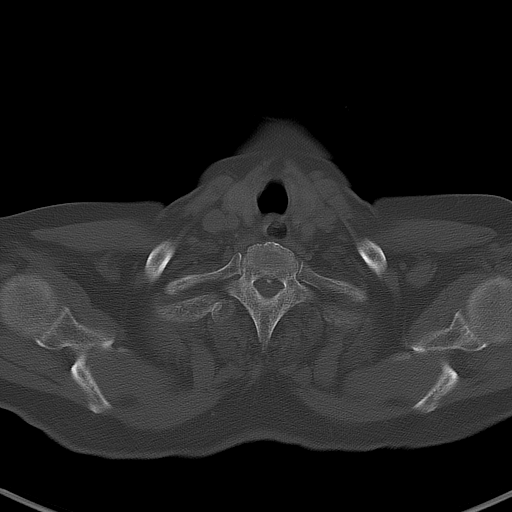

[Series 603: mip range · coronal · 1.74mm/px · 1 of 32 slices shown]
[im 1/32]
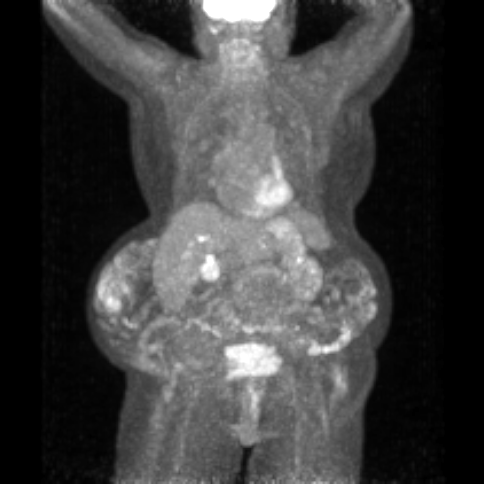

[Series 604: range-ct sk_thigh 5.0 hd_fov-cor-<alpha range> · 2 of 94 slices shown]
[im 1/94]
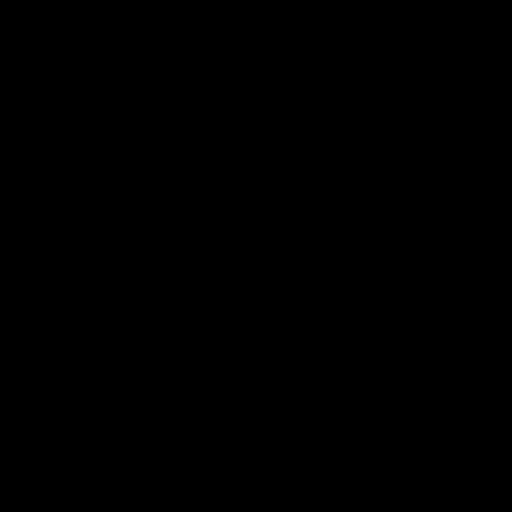
[im 94/94]
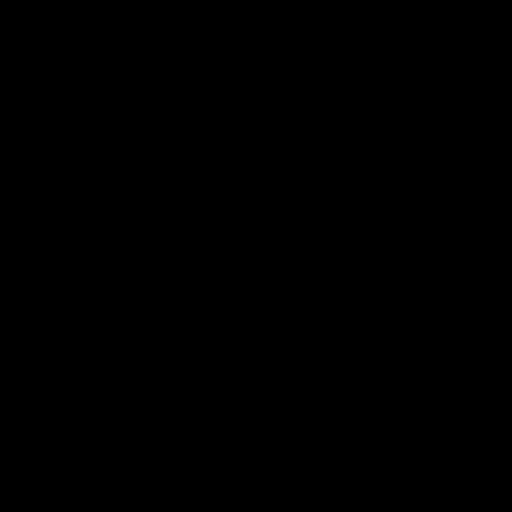

[Series 605: range-ct sk_thigh 5.0 hd_fov-tra-<alpha range> · 5 of 202 slices shown]
[im 1/202]
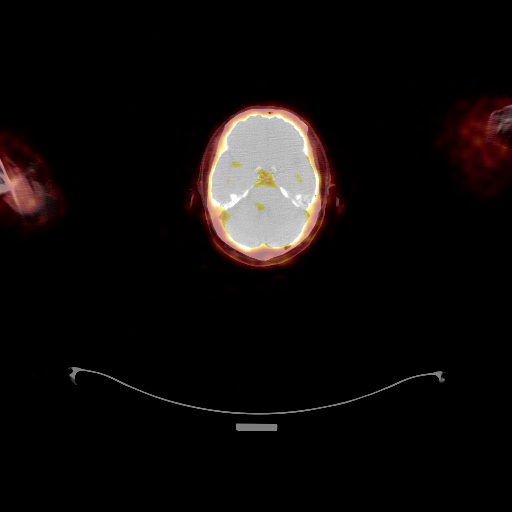
[im 51/202]
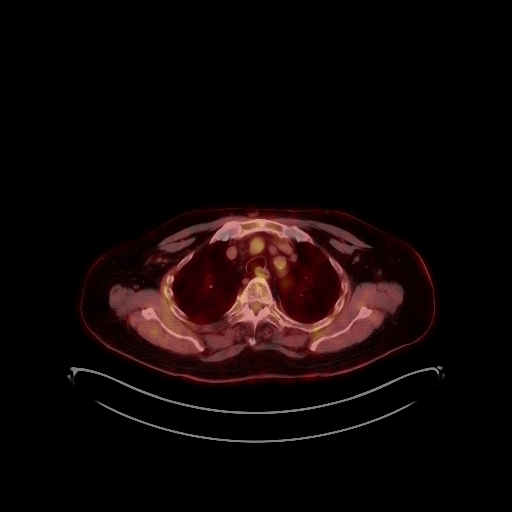
[im 101/202]
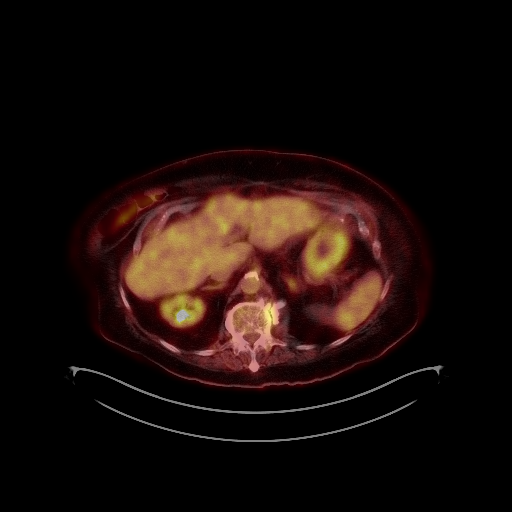
[im 151/202]
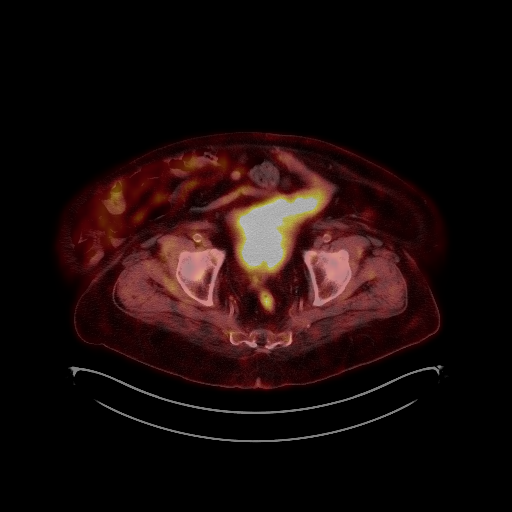
[im 202/202]
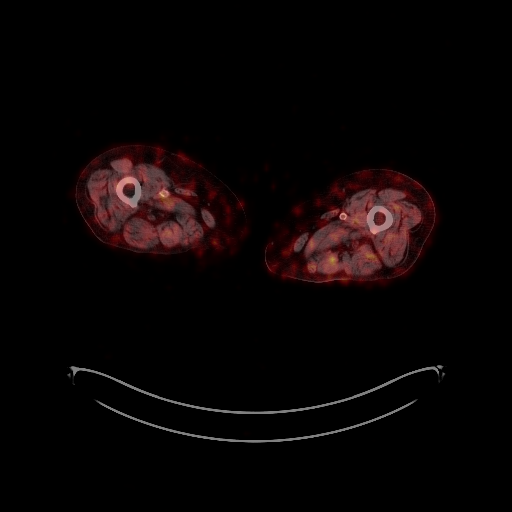

[Series 1032: results mm oncology reading · 5.0mm · 0.72mm/px · 1 of 7 slices shown]
[im 1/7]
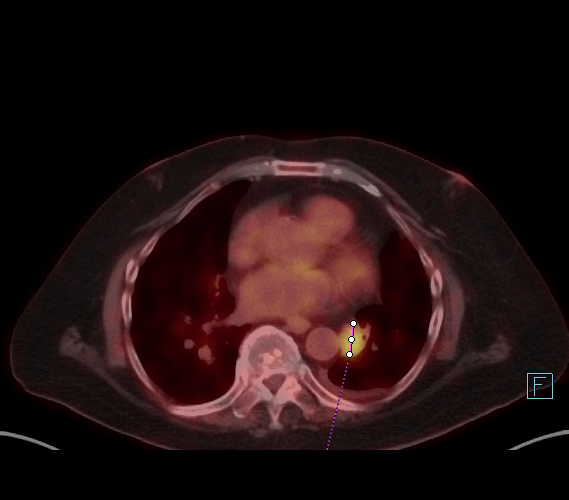

[24 of 25 positions shown; findings below may reference images not displayed]

FINDINGS: NECK

Fairly symmetric and likely physiologic activity along the palatine
tonsils. Mildly asymmetric appearance of the left ariepiglottic fold
without significant accentuated metabolic activity. No abnormal
nodal activity in the neck.

CHEST

Left lower lobe infrahilar nodule has hypermetabolic activity
spanning a 2.3 by 1.5 cm excursion, and a maximum SUV of 6.9.

The area of nodularity inferiorly in the right middle lobe measures
2.1 by 1.6 cm today and has maximum standard uptake value of 3.1.

There is atelectasis of the left lung base and posteromedially in
the right lower lobe. The left basilar region of volume loss has a
maximum SUV of 2.8.

Trace left pleural effusion. Coronary, aortic arch, and branch
vessel atherosclerotic vascular disease.

A 1.3 cm right lower paratracheal lymph node on image 63/4 has a
maximum SUV of 3.2. Background mediastinal blood pool activity 2.6.

ABDOMEN/PELVIS

No abnormal hypermetabolic activity within the liver, pancreas,
adrenal glands, or spleen. No hypermetabolic lymph nodes in the
abdomen or pelvis.

Layering small gallstones in the gallbladder. Nonobstructive left
nephrolithiasis.

Prominent laxity of the abdominal wall resulting in a splayed
appearance of the anterior abdominal contents including bowel an
urinary bladder. Prominent abdominal wall muscular atrophy.

There is some accentuated metabolic activity in a region of
potential mild wall thickening in the ascending colon shown on image
131/4, maximum SUV 8.1.

SKELETON

No focal hypermetabolic activity to suggest skeletal metastasis.
Degenerative activity in the upper lumbar spine. Potential lumbar
impingement due to spondylosis and degenerative disc disease.
IMPRESSION: 1. Central left lower lobe pulmonary nodule measuring 2.3 by 1.5 cm,
maximum SUV 6.9 compatible with malignancy.
2. Dominant nodularity inferiorly in the right middle lobe measuring
2.1 by 1.6 cm has low-grade metabolic activity with maximum SUV of
3.1, malignant versus inflammatory.
3. Low-grade activity is also present within the region of
atelectatic lung at the left lung base, SUV 2.8.
4. A 1.3 cm right lower paratracheal lymph node is mildly above
background mediastinal blood pool activity with maximum SUV 3.2.
5. Accentuated metabolic activity in the right colon, maximum SUV
8.1, questionable wall thickening in this vicinity. This could be
due to peristalsis or a colon mass. Correlation with the patient's
colon cancer screening history is recommended. If screening is not
up-to-date, appropriate screening should be considered.
6. Other imaging findings of potential clinical significance: Trace
left pleural effusion. Aortic Atherosclerosis (MFZ7D-ME3.3).
Coronary atherosclerosis. Cholelithiasis. Nonobstructive left
nephrolithiasis. Lumbar spondylosis and degenerative disc disease.
Prominent laxity of the abdominal wall musculature

## 2018-09-14 ENCOUNTER — Ambulatory Visit (INDEPENDENT_AMBULATORY_CARE_PROVIDER_SITE_OTHER): Payer: Medicare Other | Admitting: Family Medicine

## 2018-09-14 ENCOUNTER — Encounter: Payer: Self-pay | Admitting: Family Medicine

## 2018-09-14 VITALS — BP 108/60 | HR 73 | Temp 98.6°F | Ht 63.0 in | Wt 178.8 lb

## 2018-09-14 DIAGNOSIS — E1122 Type 2 diabetes mellitus with diabetic chronic kidney disease: Secondary | ICD-10-CM | POA: Diagnosis not present

## 2018-09-14 DIAGNOSIS — Z794 Long term (current) use of insulin: Secondary | ICD-10-CM | POA: Diagnosis not present

## 2018-09-14 DIAGNOSIS — I129 Hypertensive chronic kidney disease with stage 1 through stage 4 chronic kidney disease, or unspecified chronic kidney disease: Secondary | ICD-10-CM

## 2018-09-14 DIAGNOSIS — Q794 Prune belly syndrome: Secondary | ICD-10-CM | POA: Diagnosis not present

## 2018-09-14 DIAGNOSIS — N183 Chronic kidney disease, stage 3 unspecified: Secondary | ICD-10-CM

## 2018-09-14 DIAGNOSIS — E1129 Type 2 diabetes mellitus with other diabetic kidney complication: Secondary | ICD-10-CM | POA: Diagnosis not present

## 2018-09-14 DIAGNOSIS — J9611 Chronic respiratory failure with hypoxia: Secondary | ICD-10-CM

## 2018-09-14 DIAGNOSIS — D631 Anemia in chronic kidney disease: Secondary | ICD-10-CM

## 2018-09-14 DIAGNOSIS — IMO0001 Reserved for inherently not codable concepts without codable children: Secondary | ICD-10-CM

## 2018-09-14 NOTE — Progress Notes (Signed)
Lucas Price is a 78 y.o. male is here for follow up.  History of Present Illness:   Lonell Grandchild, CMA acting as scribe for Dr. Briscoe Deutscher.   HPI:   Swelling of legs. Wife has ordered pump for legs and he will try to use some to see if any improvement. At baseline. No weight gain. No stasis changes. Denies CP, dizziness.   Patient had some issues with walking back from waiting room today. His oxygen dropped to 70s but came back up to 94. He does have oxygen at home but feels like he is becoming dependent. He has been doing some CP rehab on himself at home. Discussed safety re: hypoxia and strain, he will d/w Dr. Chase Caller.   Checking BG regularly. Takes Amaryl when he has a large meal. No hypoglycemia.   He will be seeing podiatry soon for toenails.   There are no preventive care reminders to display for this patient. Depression screen [facility] 2/9 06/05/2018 04/29/2017  Decreased Interest 0 0  Down, Depressed, Hopeless 0 0  PHQ - 2 Score 0 0   PMHx, SurgHx, SocialHx, FamHx, Medications, and Allergies were reviewed in the Visit Navigator and updated as appropriate.   Patient Active Problem List   Diagnosis Date Noted  . Morbid obesity ([place]) 06/06/2018  . 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 Rhinitis, chronic 01/12/2018  . 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 Chronic respiratory failure with hypoxia ([place]), on O2 at home, followed by Pulmonology 11/05/2017  . 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 Abnormal finding on GI tract imaging   . Benign neoplasm of ascending colon   . Pulmonary nodules/lesions, multiple 09/08/2017  . 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 Renal calculus 03/19/2015  . Anemia in chronic kidney disease (CKD) 05/26/2014  . CKD (chronic kidney disease) stage 3, GFR 30-59 ml/min ([place]), followed by Renal 05/26/2014  . COPD (chronic obstructive pulmonary disease) ([place]) 05/26/2014  . Insulin dependent diabetes with renal manifestation ([place]), on  glimepiride, Lantus, Januvia 05/26/2014  . Former smoker 05/26/2014  . GERD (gastroesophageal reflux disease) 05/26/2014  . 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 Bladder neck obstruction 04/24/2014  . Recurrent urinary tract infection 09/05/2013  . Retention of urine 09/05/2013   Social History   Tobacco Use  . Smoking status: Former Smoker    Packs/day: 0.10    Years: 2.00    Pack years: 0.20    Types: Cigarettes    Last attempt to quit: 12/31/2007    Years since quitting: 10.7  . Smokeless tobacco: Former Systems developer  . Tobacco comment: smokes 4-5 every other day  Substance Use Topics  . Alcohol use: No  . Drug use: No   Current Medications and Allergies   .  allopurinol (ZYLOPRIM) 100 MG tablet, TAKE 1 TABLET EVERY DAY, Disp: 90 tablet, Rfl: 1 .  ammonium lactate (LAC-HYDRIN) 12 % cream, Apply topically as needed for dry skin., Disp: 385 g, Rfl: 0 .  aspirin EC 81 MG tablet, Take 81 mg by mouth daily. , Disp: , Rfl:  .  budesonide-formoterol (SYMBICORT) 160-4.5 MCG/ACT inhaler, Inhale 2 puffs into the lungs 2 (two) times daily., Disp: 3 Inhaler, Rfl: 3 .  calcium carbonate (CALCIUM 600) 600 MG TABS tablet, Take 600 mg by mouth daily. , Disp: , Rfl:  .  Cholecalciferol (D 10000) 10000 units CAPS, Take 10,000 Units by mouth 2 (two)  times a week. , Disp: , Rfl:  .  clotrimazole-betamethasone (LOTRISONE) cream, Apply 1 application topically 2 (two) times daily., Disp: 45 g, Rfl: 1 .  Coenzyme Q-10 200 MG CAPS, Take 200 mg by mouth daily. , Disp: , Rfl:  .  dorzolamide (TRUSOPT) 2 % ophthalmic solution, , Disp: , Rfl:  .  glimepiride (AMARYL) 4 MG tablet, TAKE 1 TABLET DAILY WITH BREAKFAST., Disp: 90 tablet, Rfl: 1 .  guaiFENesin (MUCINEX) 600 MG 12 hr tablet, Take 1,200 mg by mouth 2 (two) times daily., Disp: , Rfl:  .  Insulin Syringe-Needle U-100 (BD INSULIN SYRINGE ULTRAFINE) 31G X 5/16"  0.5 ML MISC, 1 each by Does not apply route daily., Disp: 100 each, Rfl: 1 .  Insulin Syringe-Needle U-100 (RELION INSULIN SYRINGE 1ML/31G) 31G X 5/16" 1 ML MISC, 1 Syringe by Does not apply route 3 (three) times daily., Disp: 300 each, Rfl: 1 .  irbesartan (AVAPRO) 75 MG tablet, Take 1 tablet (75 mg total) by mouth daily., Disp: 90 tablet, Rfl: 3 .  ketorolac (ACULAR) 0.5 % ophthalmic solution, , Disp: , Rfl:  .  LANTUS 100 UNIT/ML injection, INJECT  50 UNITS SUBCUTANEOUSLY AT BEDTIME, Disp: 50 mL, Rfl: 0 .  metoprolol (TOPROL-XL) 200 MG 24 hr tablet, Take 50 mg by mouth daily. , Disp: , Rfl:  .  OXYGEN, Inhale 1.5 L into the lungs., Disp: , Rfl:  .  prednisoLONE acetate (PRED FORTE) 1 % ophthalmic suspension, , Disp: , Rfl: 0 .  sitaGLIPtin (JANUVIA) 100 MG tablet, 1 tab daily, Disp: 90 tablet, Rfl: 3 .  SPIRIVA RESPIMAT 2.5 MCG/ACT AERS, INHALE 2 PUFFS INTO THE LUNGS DAILY., Disp: 12 g, Rfl: 3 .  Syringe, Disposable, 1 ML MISC, Use with insulin needle., Disp: 100 each, Rfl: 3 .  Torsemide (DEMADEX PO), Take 25 mg by mouth., Disp: , Rfl:  .  Travoprost, BAK Free, (TRAVATAN Z) 0.004 % SOLN ophthalmic solution, Place 1 drop in both eyes once daily, Disp: , Rfl:   No Known Allergies   Review of Systems   Pertinent items are noted in the HPI. Otherwise, a complete ROS is negative.  Vitals   Vitals:   09/14/18 1416  BP: 108/60  Pulse: 73  Temp: 98.6 F (37 C)  TempSrc: Oral  SpO2: 97%  Weight: 178 lb 12.8 oz (81.1 kg)  Height: 5\' 3"  (1.6 m)     Body mass index is 31.67 kg/m.  Physical Exam   Physical Exam Vitals signs and nursing note reviewed.  Constitutional:      General: He is not in acute distress.    Appearance: He is well-developed. He is not diaphoretic.  HENT:     Head: Normocephalic and atraumatic.     Right Ear: External ear normal.     Left Ear: External ear normal.     Nose: Nose normal.     Mouth/Throat:     Mouth: Mucous membranes are moist.  Eyes:      Conjunctiva/sclera: Conjunctivae normal.     Pupils: Pupils are equal, round, and reactive to light.  Neck:     Musculoskeletal: Neck supple.  Cardiovascular:     Rate and Rhythm: Normal rate and regular rhythm.  Pulmonary:     Effort: Pulmonary effort is normal. No respiratory distress.     Breath sounds: Decreased breath sounds present.  Abdominal:     General: Bowel sounds are normal.     Palpations: Abdomen is soft.  Comments: Prune belly.   Musculoskeletal: Normal range of motion.  Skin:    General: Skin is warm.  Neurological:     Mental Status: He is alert.  Psychiatric:        Behavior: Behavior normal.    Assessment and Plan   Orvis was seen today for follow-up.  Diagnoses and all orders for this visit:  Anemia in stage 3 chronic kidney disease ([place]) -     CBC with Differential/Platelet -     Magnesium -     Phosphorus  Chronic respiratory failure with hypoxia ([place]), on O2 at home, followed by Pulmonology Comments: Encouraged use of O2 of dips below 85%RA and d/w Pulmonology.   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 Labs today.  Orders: -     Hemoglobin A1c  Prune belly syndrome    . Orders and follow up as documented in [place], reviewed diet, exercise and weight control, cardiovascular risk and specific lipid/LDL goals reviewed, reviewed medications and side effects in detail.  . Reviewed expectations re: course of current medical issues. . Outlined signs and symptoms indicating need for more acute intervention. . Patient verbalized understanding and all questions were answered. . Patient received an After Visit Summary.  CMA served as Education administrator during this visit. History, Physical, and Plan performed by medical provider. The above documentation has been  reviewed and is accurate and complete. Briscoe Deutscher, D.O.  Briscoe Deutscher, DO [place], [place] 09/17/2018

## 2018-09-15 LAB — COMPREHENSIVE METABOLIC PANEL
ALT: 19 U/L (ref 0–53)
AST: 19 U/L (ref 0–37)
Albumin: 3.7 g/dL (ref 3.5–5.2)
Alkaline Phosphatase: 82 U/L (ref 39–117)
BUN: 51 mg/dL — ABNORMAL HIGH (ref 6–23)
CO2: 23 mEq/L (ref 19–32)
Calcium: 8.9 mg/dL (ref 8.4–10.5)
Chloride: 105 mEq/L (ref 96–112)
Creatinine, Ser: 1.95 mg/dL — ABNORMAL HIGH (ref 0.40–1.50)
GFR: 33.61 mL/min — ABNORMAL LOW (ref >60.00)
Glucose, Bld: 110 mg/dL — ABNORMAL HIGH (ref 70–99)
Potassium: 4.3 mEq/L (ref 3.5–5.1)
Sodium: 139 mEq/L (ref 135–145)
Total Bilirubin: 0.3 mg/dL (ref 0.2–1.2)
Total Protein: 7.3 g/dL (ref 6.0–8.3)

## 2018-09-15 LAB — CBC WITH DIFFERENTIAL/PLATELET
Basophils Absolute: 0.1 10*3/uL (ref 0.0–0.1)
Basophils Relative: 1.2 % (ref 0.0–3.0)
Eosinophils Absolute: 0.2 10*3/uL (ref 0.0–0.7)
Eosinophils Relative: 1.9 % (ref 0.0–5.0)
HCT: 38.3 % — ABNORMAL LOW (ref 39.0–52.0)
Hemoglobin: 12.4 g/dL — ABNORMAL LOW (ref 13.0–17.0)
Lymphocytes Relative: 15.3 % (ref 12.0–46.0)
Lymphs Abs: 1.5 10*3/uL (ref 0.7–4.0)
MCHC: 32.4 g/dL (ref 30.0–36.0)
MCV: 96 fl (ref 78.0–100.0)
Monocytes Absolute: 0.6 10*3/uL (ref 0.1–1.0)
Monocytes Relative: 6.3 % (ref 3.0–12.0)
Neutro Abs: 7.5 10*3/uL (ref 1.4–7.7)
Neutrophils Relative %: 75.3 % (ref 43.0–77.0)
Platelets: 153 10*3/uL (ref 150.0–400.0)
RBC: 3.99 Mil/uL — ABNORMAL LOW (ref 4.22–5.81)
RDW: 15.9 % — ABNORMAL HIGH (ref 11.5–15.5)
WBC: 9.9 10*3/uL (ref 4.0–10.5)

## 2018-09-15 LAB — MAGNESIUM: Magnesium: 2.3 mg/dL (ref 1.5–2.5)

## 2018-09-15 LAB — HEMOGLOBIN A1C: Hgb A1c MFr Bld: 5.7 % (ref 4.6–6.5)

## 2018-09-15 LAB — PHOSPHORUS: Phosphorus: 5.3 mg/dL — ABNORMAL HIGH (ref 2.3–4.6)

## 2018-09-17 ENCOUNTER — Telehealth: Payer: Self-pay | Admitting: Family Medicine

## 2018-09-17 NOTE — Telephone Encounter (Signed)
Called patient l/m to let know that you are out of office will send message to get results as soon as we can.

## 2018-09-17 NOTE — Telephone Encounter (Signed)
See note  Copied from [place] [phone]. Topic: Quick Communication - Lab Results (Clinic Use ONLY) >> Sep 17, 2018  1:52 PM Lennox Solders wrote: Pt is calling and would like blood work results from 09-14-2018

## 2018-09-20 ENCOUNTER — Telehealth: Payer: Self-pay | Admitting: Family Medicine

## 2018-09-20 NOTE — Telephone Encounter (Signed)
Contact patient and advise on AWV update

## 2018-09-20 NOTE — Telephone Encounter (Signed)
Copied from [employer] [id number]. Topic: Quick Communication - See Telephone Encounter >> Sep 20, 2018 12:41 PM Blase Mess A wrote: CRM for notification. See Telephone encounter for: 09/20/18.  Patient is calling to reschedule AWV please advise [phone]

## 2018-09-21 ENCOUNTER — Telehealth: Payer: Self-pay | Admitting: Family Medicine

## 2018-09-21 NOTE — Telephone Encounter (Signed)
Called patient mailed copy of wife labs after checking address.

## 2018-09-21 NOTE — Telephone Encounter (Signed)
Pt would like wifes Lab results sent to him. I told him he would have to come in and sign a paper for that..the patient stated he has already done that and did not understand. Please advise.

## 2018-09-22 ENCOUNTER — Ambulatory Visit: Payer: PRIVATE HEALTH INSURANCE

## 2018-09-22 DIAGNOSIS — E113212 Type 2 diabetes mellitus with mild nonproliferative diabetic retinopathy with macular edema, left eye: Secondary | ICD-10-CM | POA: Diagnosis not present

## 2018-09-22 DIAGNOSIS — H35352 Cystoid macular degeneration, left eye: Secondary | ICD-10-CM | POA: Diagnosis not present

## 2018-09-22 DIAGNOSIS — H5213 Myopia, bilateral: Secondary | ICD-10-CM | POA: Diagnosis not present

## 2018-09-22 DIAGNOSIS — H401134 Primary open-angle glaucoma, bilateral, indeterminate stage: Secondary | ICD-10-CM | POA: Diagnosis not present

## 2018-09-22 LAB — HM DIABETES EYE EXAM

## 2018-09-28 ENCOUNTER — Encounter: Payer: Self-pay | Admitting: Podiatry

## 2018-09-28 ENCOUNTER — Ambulatory Visit (INDEPENDENT_AMBULATORY_CARE_PROVIDER_SITE_OTHER): Payer: Medicare Other | Admitting: Podiatry

## 2018-09-28 DIAGNOSIS — B351 Tinea unguium: Secondary | ICD-10-CM

## 2018-09-28 DIAGNOSIS — M79674 Pain in right toe(s): Secondary | ICD-10-CM

## 2018-09-28 DIAGNOSIS — M79675 Pain in left toe(s): Secondary | ICD-10-CM

## 2018-09-28 DIAGNOSIS — E119 Type 2 diabetes mellitus without complications: Secondary | ICD-10-CM

## 2018-09-28 NOTE — Progress Notes (Signed)
Complaint:  Visit Type: Patient returns to my office for continued preventative foot care services. Complaint: Patient states" my nails have grown long and thick and become painful to walk and wear shoes" Patient has been diagnosed with DM with no foot complications. The patient presents for preventative foot care services. No changes to ROS  Podiatric Exam: Vascular: dorsalis pedis and posterior tibial pulses are palpable bilateral. Capillary return is immediate. Temperature gradient is WNL. Skin turgor WNL  Sensorium: Normal Semmes Weinstein monofilament test. Normal tactile sensation bilaterally. Nail Exam: Pt has thick disfigured discolored nails with subungual debris noted bilateral entire nail hallux through fifth toenails Ulcer Exam: There is no evidence of ulcer or pre-ulcerative changes or infection. Orthopedic Exam: Muscle tone and strength are WNL. No limitations in general ROM. No crepitus or effusions noted. Foot type and digits show no abnormalities. Bony prominences are unremarkable. Skin: No Porokeratosis. No infection or ulcers  Diagnosis:  Onychomycosis, , Pain in right toe, pain in left toes  Treatment & Plan Procedures and Treatment: Consent by patient was obtained for treatment procedures.   Debridement of mycotic and hypertrophic toenails, 1 through 5 bilateral and clearing of subungual debris. No ulceration, no infection noted.  Return Visit-Office Procedure: Patient instructed to return to the office for a follow up visit 4 months for continued evaluation and treatment.    Nashay Brickley DPM 

## 2018-10-05 ENCOUNTER — Telehealth: Payer: Self-pay | Admitting: Family Medicine

## 2018-10-05 ENCOUNTER — Other Ambulatory Visit: Payer: Self-pay

## 2018-10-05 MED ORDER — ALLOPURINOL 100 MG PO TABS: 100.0000 mg | ORAL_TABLET | Freq: Every day | ORAL | 1 refills | Status: DC

## 2018-10-05 NOTE — Telephone Encounter (Signed)
Filled out taken to reception called patient and let them know.

## 2018-10-05 NOTE — Telephone Encounter (Signed)
Copied from [place] [id number]. Topic: General - Other >> Oct 05, 2018 11:28 AM Lucas Price wrote: Reason for CRM: pt is requesting a new update handicap placard. Pt says that his has expired. Pt would like a call when ready for pick up.

## 2018-10-07 ENCOUNTER — Other Ambulatory Visit: Payer: Self-pay | Admitting: Family Medicine

## 2018-10-07 ENCOUNTER — Telehealth: Payer: Self-pay | Admitting: Family Medicine

## 2018-10-07 MED ORDER — ALLOPURINOL 100 MG PO TABS: 100.0000 mg | ORAL_TABLET | Freq: Every day | ORAL | 1 refills | Status: DC

## 2018-10-07 NOTE — Telephone Encounter (Signed)
Copied from [place] [phone]. Topic: Quick Communication - Rx Refill/Question >> Oct 07, 2018 11:05 AM Scherrie Gerlach wrote: Medication: allopurinol (ZYLOPRIM) 100 MG tablet  [facility] called to ask if we would send this Rx to them.  Called [facility] and the Rx is still at [facility].  Called the pt and left message.  [facility] states the pt called them and prefers this sent to them. 84 day  [place], [place] [phone] (Phone) [phone] (Fax)

## 2018-10-07 NOTE — Telephone Encounter (Signed)
Copied from [place]. Topic: Quick Communication - Rx Refill/Question >> Oct 07, 2018 11:38 AM Alanda Slim E wrote: Medication: allopurinol (ZYLOPRIM) 100 MG tablet   Has the patient contacted their pharmacy? Yes -[facility] needs to be to mail order pharmacy - please resend to [place]   Preferred Pharmacy (with phone number or street name): [place], [place] [phone] (Phone) [phone] (Fax)    Agent: Please be advised that RX refills may take up to 3 business days. We ask that you follow-up with your pharmacy.

## 2018-10-15 ENCOUNTER — Other Ambulatory Visit: Payer: Self-pay | Admitting: *Deleted

## 2018-10-15 MED ORDER — ALLOPURINOL 100 MG PO TABS: 100.0000 mg | ORAL_TABLET | Freq: Every day | ORAL | 1 refills | Status: DC

## 2018-10-21 ENCOUNTER — Other Ambulatory Visit: Payer: Self-pay | Admitting: Family Medicine

## 2018-10-21 ENCOUNTER — Other Ambulatory Visit: Payer: Self-pay

## 2018-10-21 MED ORDER — METOPROLOL SUCCINATE ER 200 MG PO TB24: ORAL_TABLET | ORAL | 3 refills | Status: DC

## 2018-10-21 NOTE — Telephone Encounter (Signed)
Copied from [place] [id number]. Topic: Quick Communication - Rx Refill/Question >> Oct 21, 2018  1:56 PM Bea Graff, NT wrote: Medication: metoprolol (TOPROL-XL) 200 MG 24 hr tablet  90 day rx  Has the patient contacted their pharmacy? Yes.   (Agent: If no, request that the patient contact the pharmacy for the refill.) (Agent: If yes, when and what did the pharmacy advise?)  Preferred Pharmacy (with phone number or street name): [place], [place] [phone] (Phone) [phone] (Fax)    Agent: Please be advised that RX refills may take up to 3 business days. We ask that you follow-up with your pharmacy.

## 2018-10-21 NOTE — Telephone Encounter (Signed)
See note

## 2018-10-21 NOTE — Telephone Encounter (Signed)
Requested medication (s) are due for refill today:  yes  Requested medication (s) are on the active medication list:  yes  Future visit scheduled:  yes  Last Refill: historical provider  Requested Prescriptions  Pending Prescriptions Disp Refills   metoprolol (TOPROL-XL) 200 MG 24 hr tablet      Sig: Take 0.5 tablets (100 mg total) by mouth daily.     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

## 2018-10-25 ENCOUNTER — Encounter: Payer: Self-pay | Admitting: Family Medicine

## 2018-10-25 NOTE — Progress Notes (Deleted)
Patient: Lucas Price MRN: [id number] DOB: [birth year] PCP: Briscoe Deutscher, DO     Subjective:  No chief complaint on file.   HPI: The patient is a 78 y.o. male who presents today for ***  Review of Systems  Allergies Patient has No Known Allergies.  Past Medical History Patient  has a past medical history of Anemia (05/26/2014), Bladder neck obstruction (04/24/2014), Chronic respiratory failure with hypoxia ([place]), CKD (chronic kidney disease) (05/26/2014), COPD (chronic obstructive pulmonary disease) ([place]) (05/26/2014), Coronary artery calcification seen on CAT scan, Diabetes mellitus ([place]) (05/26/2014), Former smoker (05/26/2014), GERD (gastroesophageal reflux disease) (05/26/2014), Glaucoma, Heart murmur, HTN (hypertension) (05/26/2014), Ischemic heart disease, Lung nodules, Open-angle glaucoma (11/23/2017), Prune belly syndrome (05/26/2014), Recurrent urinary tract infection (09/05/2013), Renal calculus (03/19/2015), Retention of urine (09/05/2013), Sleep apnea, and SOB (shortness of breath).  Surgical History Patient  has a past surgical history that includes Cataract extraction, bilateral; Hernia repair (Bilateral); Colonoscopy with propofol (N/A, 09/22/2017); Video bronchoscopy with endobronchial navigation (N/A, 10/07/2017); Video bronchoscopy with endobronchial ultrasound (N/A, 10/07/2017); and Bladder neck reconstruction.  Family History Pateint's family history includes Diabetes in his mother; Heart disease in his mother.  Social History Patient  reports that he quit smoking about 10 years ago. His smoking use included cigarettes. He has a 0.20 pack-year smoking history. He has quit using smokeless tobacco. He reports that he does not drink alcohol or use drugs.    Objective: There were no vitals filed for this visit.  There is no height or weight on file to calculate BMI.  Physical Exam     Assessment/plan:      No follow-ups on file.     @AWME @ 10/25/2018

## 2018-10-26 ENCOUNTER — Ambulatory Visit: Payer: PRIVATE HEALTH INSURANCE | Admitting: Family Medicine

## 2018-10-26 ENCOUNTER — Other Ambulatory Visit: Payer: Self-pay

## 2018-10-26 ENCOUNTER — Ambulatory Visit (INDEPENDENT_AMBULATORY_CARE_PROVIDER_SITE_OTHER): Payer: Medicare Other | Admitting: Family Medicine

## 2018-10-26 ENCOUNTER — Encounter: Payer: Self-pay | Admitting: Family Medicine

## 2018-10-26 VITALS — BP 118/58 | HR 95 | Temp 97.5°F | Ht 63.0 in | Wt 175.4 lb

## 2018-10-26 DIAGNOSIS — E1129 Type 2 diabetes mellitus with other diabetic kidney complication: Secondary | ICD-10-CM | POA: Diagnosis not present

## 2018-10-26 DIAGNOSIS — J9611 Chronic respiratory failure with hypoxia: Secondary | ICD-10-CM | POA: Diagnosis not present

## 2018-10-26 DIAGNOSIS — I129 Hypertensive chronic kidney disease with stage 1 through stage 4 chronic kidney disease, or unspecified chronic kidney disease: Secondary | ICD-10-CM | POA: Diagnosis not present

## 2018-10-26 DIAGNOSIS — R300 Dysuria: Secondary | ICD-10-CM | POA: Diagnosis not present

## 2018-10-26 DIAGNOSIS — N183 Chronic kidney disease, stage 3 unspecified: Secondary | ICD-10-CM

## 2018-10-26 DIAGNOSIS — Z8619 Personal history of other infectious and parasitic diseases: Secondary | ICD-10-CM

## 2018-10-26 DIAGNOSIS — D631 Anemia in chronic kidney disease: Secondary | ICD-10-CM

## 2018-10-26 DIAGNOSIS — E1122 Type 2 diabetes mellitus with diabetic chronic kidney disease: Secondary | ICD-10-CM | POA: Diagnosis not present

## 2018-10-26 DIAGNOSIS — IMO0001 Reserved for inherently not codable concepts without codable children: Secondary | ICD-10-CM

## 2018-10-26 DIAGNOSIS — Z794 Long term (current) use of insulin: Secondary | ICD-10-CM | POA: Diagnosis not present

## 2018-10-26 LAB — COMPREHENSIVE METABOLIC PANEL
ALT: 22 U/L (ref 0–53)
AST: 15 U/L (ref 0–37)
Albumin: 3.8 g/dL (ref 3.5–5.2)
Alkaline Phosphatase: 83 U/L (ref 39–117)
BUN: 47 mg/dL — ABNORMAL HIGH (ref 6–23)
CO2: 24 mEq/L (ref 19–32)
Calcium: 8.9 mg/dL (ref 8.4–10.5)
Chloride: 106 mEq/L (ref 96–112)
Creatinine, Ser: 1.89 mg/dL — ABNORMAL HIGH (ref 0.40–1.50)
GFR: 34.83 mL/min — ABNORMAL LOW (ref >60.00)
Glucose, Bld: 75 mg/dL (ref 70–99)
Potassium: 4.9 mEq/L (ref 3.5–5.1)
Sodium: 139 mEq/L (ref 135–145)
Total Bilirubin: 0.4 mg/dL (ref 0.2–1.2)
Total Protein: 7 g/dL (ref 6.0–8.3)

## 2018-10-26 LAB — CBC WITH DIFFERENTIAL/PLATELET
Basophils Absolute: 0 10*3/uL (ref 0.0–0.1)
Basophils Relative: 0.4 % (ref 0.0–3.0)
Eosinophils Absolute: 0.1 10*3/uL (ref 0.0–0.7)
Eosinophils Relative: 1.4 % (ref 0.0–5.0)
HCT: 40.3 % (ref 39.0–52.0)
Hemoglobin: 13.4 g/dL (ref 13.0–17.0)
Lymphocytes Relative: 15.8 % (ref 12.0–46.0)
Lymphs Abs: 1.5 10*3/uL (ref 0.7–4.0)
MCHC: 33.2 g/dL (ref 30.0–36.0)
MCV: 94.4 fl (ref 78.0–100.0)
Monocytes Absolute: 0.7 10*3/uL (ref 0.1–1.0)
Monocytes Relative: 6.9 % (ref 3.0–12.0)
Neutro Abs: 7.3 10*3/uL (ref 1.4–7.7)
Neutrophils Relative %: 75.5 % (ref 43.0–77.0)
Platelets: 149 10*3/uL — ABNORMAL LOW (ref 150.0–400.0)
RBC: 4.26 Mil/uL (ref 4.22–5.81)
RDW: 15.5 % (ref 11.5–15.5)
WBC: 9.6 10*3/uL (ref 4.0–10.5)

## 2018-10-26 LAB — POC URINALSYSI DIPSTICK (AUTOMATED)
Bilirubin, UA: NEGATIVE
Blood, UA: NEGATIVE
Glucose, UA: NEGATIVE
Ketones, UA: NEGATIVE
Leukocytes, UA: NEGATIVE
Nitrite, UA: NEGATIVE
Protein, UA: NEGATIVE
Spec Grav, UA: 1.015 (ref 1.010–1.025)
Urobilinogen, UA: 2 E.U./dL — AB
pH, UA: 6 (ref 5.0–8.0)

## 2018-10-26 LAB — MICROALBUMIN / CREATININE URINE RATIO
Creatinine,U: 34.1 mg/dL
Microalb Creat Ratio: 7.4 mg/g (ref 0.0–30.0)
Microalb, Ur: 2.5 mg/dL — ABNORMAL HIGH (ref 0.0–1.9)

## 2018-10-26 LAB — MAGNESIUM: Magnesium: 2.3 mg/dL (ref 1.5–2.5)

## 2018-10-26 LAB — PHOSPHORUS: Phosphorus: 4.4 mg/dL (ref 2.3–4.6)

## 2018-10-26 MED ORDER — SITAGLIPTIN PHOSPHATE 50 MG PO TABS: ORAL_TABLET | ORAL | 3 refills | Status: DC

## 2018-10-26 MED ORDER — METOPROLOL SUCCINATE ER 50 MG PO TB24: ORAL_TABLET | ORAL | 0 refills | Status: DC

## 2018-10-26 MED ORDER — CEPHALEXIN 250 MG PO CAPS: 250.0000 mg | ORAL_CAPSULE | Freq: Three times a day (TID) | ORAL | 0 refills | Status: DC

## 2018-10-26 MED ORDER — NYSTATIN 100000 UNIT/ML MT SUSP: 5.0000 mL | Freq: Four times a day (QID) | OROMUCOSAL | 0 refills | Status: DC

## 2018-10-26 NOTE — Progress Notes (Signed)
Lucas Price is a 78 y.o. male here for an acute visit.  History of Present Illness:   HPI: Patient with history of chronic kidney disease and diabetes presents with chief complaint of dysuria times a few days.  History of UTIs that presented this way.  Denies any back pain, fever, nausea, vomiting.  No prostatitis symptoms.  No recent antibiotic use.  Blood sugars are controlled well.  Patient is due for follow-up labs for hyperkalemia and chronic kidney disease.  Patient with chronic respiratory failure on O2 at home.  We discussed precautions during the next several weeks regarding the coronavirus.  PMHx, SurgHx, SocialHx, Medications, and Allergies were reviewed in the Visit Navigator and updated as appropriate.  Current Medications   .  allopurinol (ZYLOPRIM) 100 MG tablet, Take 1 tablet (100 mg total) by mouth daily., Disp: 90 tablet, Rfl: 1 .  ammonium lactate (LAC-HYDRIN) 12 % cream, Apply topically as needed for dry skin., Disp: 385 g, Rfl: 0 .  aspirin EC 81 MG tablet, Take 81 mg by mouth daily. , Disp: , Rfl:  .  budesonide-formoterol (SYMBICORT) 160-4.5 MCG/ACT inhaler, Inhale 2 puffs into the lungs 2 (two) times daily., Disp: 3 Inhaler, Rfl: 3 .  calcium carbonate (CALCIUM 600) 600 MG TABS tablet, Take 600 mg by mouth daily. , Disp: , Rfl:  .  Cholecalciferol (D 10000) 10000 units CAPS, Take 10,000 Units by mouth 2 (two) times a week. , Disp: , Rfl:  .  clotrimazole-betamethasone (LOTRISONE) cream, Apply 1 application topically 2 (two) times daily., Disp: 45 g, Rfl: 1 .  Coenzyme Q-10 200 MG CAPS, Take 200 mg by mouth daily. , Disp: , Rfl:  .  dorzolamide (TRUSOPT) 2 % ophthalmic solution, , Disp: , Rfl:  .  glimepiride (AMARYL) 4 MG tablet, TAKE 1 TABLET DAILY WITH BREAKFAST., Disp: 90 tablet, Rfl: 1 .  guaiFENesin (MUCINEX) 600 MG 12 hr tablet, Take 1,200 mg by mouth 2 (two) times daily., Disp: , Rfl:  .  Insulin Syringe-Needle U-100 (BD INSULIN SYRINGE ULTRAFINE) 31G X  5/16" 0.5 ML MISC, 1 each by Does not apply route daily., Disp: 100 each, Rfl: 1 .  Insulin Syringe-Needle U-100 (RELION INSULIN SYRINGE 1ML/31G) 31G X 5/16" 1 ML MISC, 1 Syringe by Does not apply route 3 (three) times daily., Disp: 300 each, Rfl: 1 .  irbesartan (AVAPRO) 75 MG tablet, Take 1 tablet (75 mg total) by mouth daily., Disp: 90 tablet, Rfl: 3 .  ketorolac (ACULAR) 0.5 % ophthalmic solution, , Disp: , Rfl:  .  LANTUS 100 UNIT/ML injection, INJECT  50 UNITS SUBCUTANEOUSLY AT BEDTIME, Disp: 50 mL, Rfl: 0 .  metoprolol (TOPROL-XL) 200 MG 24 hr tablet, Take 1/2 tablet po daily, Disp: 60 tablet, Rfl: 3 .  OXYGEN, Inhale 1.5 L into the lungs., Disp: , Rfl:  .  prednisoLONE acetate (PRED FORTE) 1 % ophthalmic suspension, , Disp: , Rfl: 0 .  sitaGLIPtin (JANUVIA) 100 MG tablet, 1 tab daily, Disp: 90 tablet, Rfl: 3 .  SPIRIVA RESPIMAT 2.5 MCG/ACT AERS, INHALE 2 PUFFS INTO THE LUNGS DAILY., Disp: 12 g, Rfl: 3 .  Syringe, Disposable, 1 ML MISC, Use with insulin needle., Disp: 100 each, Rfl: 3 .  Torsemide (DEMADEX PO), Take 25 mg by mouth., Disp: , Rfl:  .  Travoprost, BAK Free, (TRAVATAN Z) 0.004 % SOLN ophthalmic solution, Place 1 drop in both eyes once daily, Disp: , Rfl:    No Known Allergies   Review of Systems  Pertinent items are noted in the HPI. Otherwise, ROS is negative.  Vitals   Vitals:   10/26/18 1141  BP: (!) 118/58  Pulse: 95  Temp: (!) 97.5 F (36.4 C)  TempSrc: Oral  SpO2: 96%  Weight: 175 lb 6.4 oz (79.6 kg)  Height: 5\' 3"  (1.6 m)     Body mass index is 31.07 kg/m.  Physical Exam   Physical Exam Vitals signs and nursing note reviewed.  Constitutional:      General: He is not in acute distress.    Appearance: He is well-developed. He is not diaphoretic.  HENT:     Head: Normocephalic and atraumatic.     Right Ear: External ear normal.     Left Ear: External ear normal.     Nose: Nose normal.     Mouth/Throat:     Mouth: Mucous membranes are moist.    Eyes:     Conjunctiva/sclera: Conjunctivae normal.     Pupils: Pupils are equal, round, and reactive to light.  Neck:     Musculoskeletal: Neck supple.  Cardiovascular:     Rate and Rhythm: Normal rate and regular rhythm.  Pulmonary:     Effort: Pulmonary effort is normal. No respiratory distress.     Breath sounds: Decreased breath sounds present.  Abdominal:     General: Bowel sounds are normal.     Palpations: Abdomen is soft.     Comments: Prune belly.   Musculoskeletal: Normal range of motion.  Skin:    General: Skin is warm.  Neurological:     Mental Status: He is alert.  Psychiatric:        Behavior: Behavior normal.      Results for orders placed or performed in visit on 10/26/18  POCT Urinalysis Dipstick (Automated)  Result Value Ref Range   Color, UA Urine    Clarity, UA yellow    Glucose, UA Negative Negative   Bilirubin, UA Negative    Ketones, UA Negative    Spec Grav, UA 1.015 1.010 - 1.025   Blood, UA Negative    pH, UA 6.0 5.0 - 8.0   Protein, UA Negative Negative   Urobilinogen, UA 2.0 (A) 0.2 or 1.0 E.U./dL   Nitrite, UA Negative    Leukocytes, UA Negative Negative    Assessment and Plan   [name] was seen today for dysuria.  Diagnoses and all orders for this visit:  Dysuria -     POCT Urinalysis Dipstick (Automated) -     Urine Culture -     cephALEXin (KEFLEX) 250 MG capsule; Take 1 capsule (250 mg total) by mouth 3 (three) times daily. 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    . Reviewed expectations re: course of current medical issues. . Discussed self-management of symptoms. . Outlined signs and symptoms indicating need for more acute intervention. . Patient verbalized understanding and all questions were answered. [name] Health Maintenance issues including appropriate healthy diet, exercise, and smoking avoidance were discussed with patient. . See orders for this visit as documented in the electronic medical record. . Patient received an After Visit Summary.  Briscoe Deutscher, DO [place] 10/26/2018

## 2018-10-27 LAB — URINE CULTURE
MICRO NUMBER:: 328756
Result:: NO GROWTH
SPECIMEN QUALITY:: ADEQUATE

## 2018-10-29 ENCOUNTER — Ambulatory Visit: Payer: PRIVATE HEALTH INSURANCE | Admitting: Family Medicine

## 2018-11-02 ENCOUNTER — Inpatient Hospital Stay: Admission: RE | Admit: 2018-11-02 | Payer: Medicare Other | Source: Ambulatory Visit

## 2018-11-02 ENCOUNTER — Other Ambulatory Visit: Payer: Self-pay | Admitting: Cardiology

## 2018-11-10 ENCOUNTER — Telehealth: Payer: Self-pay | Admitting: Family Medicine

## 2018-11-10 ENCOUNTER — Other Ambulatory Visit: Payer: Self-pay | Admitting: Family Medicine

## 2018-11-10 DIAGNOSIS — R109 Unspecified abdominal pain: Secondary | ICD-10-CM

## 2018-11-10 MED ORDER — HYDROCODONE-ACETAMINOPHEN 10-325 MG PO TABS: 1.0000 | ORAL_TABLET | Freq: Three times a day (TID) | ORAL | 0 refills | Status: DC | PRN

## 2018-11-10 NOTE — Telephone Encounter (Signed)
Copied from [place] [id number]. Topic: Quick Communication - Rx Refill/Question >> Nov 10, 2018 11:36 AM Burchel, Abbi R wrote: Medication: acetaminophen (NORCO) 10-325 MG tablet [[id number]]   Pt states he was prescribed this medication by Dr [name] previously for back pain and would like to have it again for the same issue.  Please advise.    Pt: [phone]

## 2018-11-10 NOTE — Telephone Encounter (Signed)
[facility] called and l/m needing dx code for pt ,for his rx for Norco. plaease advise

## 2018-11-11 NOTE — Telephone Encounter (Signed)
Called pharmacy not open l/m with dx code that is on script.

## 2019-01-10 NOTE — Progress Notes (Signed)
Lucas Price is a 78 y.o. male is here for follow up.  History of Present Illness:   Lucas Price, CMA acting as scribe for Dr. Briscoe Deutscher.   HPI: Patient has had increased issues with thrush. He would like diflucan called in to local pharmacy. It if very raw at times and hard to eat. He has had some bleeding of gums for about two weeks.   He has also had increased shortness of breath. He had a follow up with pulmonology in February but it was cancelled.   1. 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 Insulin dependent diabetes with renal manifestation ([place]), on glimepiride, Lantus, Januvia   3. Chronic respiratory failure with hypoxia ([place]), on O2 at home, followed by Pulmonology   4. CKD (chronic kidney disease) stage 3, GFR 30-59 ml/min ([place]), followed by Renal   5. Morbid obesity (HCC)    Medication compliance: compliant all of the time, diabetic diet compliance: compliant most of the time, home glucose monitoring: is performed sporadically, further diabetic ROS: no polyuria or polydipsia, no chest pain, dyspnea or TIA's, no numbness, tingling or pain in extremities.  BP Readings from Last 3 Encounters:  01/11/19 124/82  10/26/18 (!) 118/58  09/14/18 108/60   Lab Results  Component Value Date   CREATININE 2.02 (H) 01/11/2019   CREATININE 1.89 (H) 10/26/2018   CREATININE 1.95 (H) 09/14/2018     Lab Results  Component Value Date   CHOL 89 06/04/2018   HDL 35.60 (L) 06/04/2018   LDLCALC 36 06/04/2018   TRIG 89.0 06/04/2018   CHOLHDL 3 06/04/2018   Lab Results  Component Value Date   ALT 18 01/11/2019   AST 17 01/11/2019   ALKPHOS 68 01/11/2019   BILITOT 0.3 01/11/2019     CBC Latest Ref Rng & Units 01/11/2019 10/26/2018 09/14/2018  WBC 4.0 - 10.5 K/uL 10.7(H) 9.6 9.9  Hemoglobin 13.0 - 17.0 g/dL 13.3 13.4 12.4(L)  Hematocrit 39.0 - 52.0 % 39.5 40.3 38.3(L)  Platelets 150.0 - 400.0 K/uL 156.0 149.0(L) 153.0    There are no preventive care reminders to display for this patient. Depression screen [facility] 2/9 06/05/2018 04/29/2017  Decreased Interest 0 0  Down, Depressed, Hopeless 0 0  PHQ - 2 Score 0 0   PMHx, SurgHx, SocialHx, FamHx, Medications, and Allergies were reviewed in the Visit Navigator and updated as appropriate.   Patient Active Problem List   Diagnosis Date Noted  . Morbid obesity ([place]) 06/06/2018  . 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 Rhinitis, chronic 01/12/2018  . 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 Chronic respiratory failure with hypoxia ([place]), on O2 at home, followed by Pulmonology 11/05/2017  . 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 Abnormal finding on GI tract imaging   . Benign neoplasm of ascending colon   . Pulmonary nodules/lesions, multiple 09/08/2017  . 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 Renal calculus 03/19/2015  . Anemia in chronic kidney disease (CKD) 05/26/2014  . CKD (chronic kidney disease) stage 3, GFR 30-59 ml/min ([place]), followed by Renal 05/26/2014  . COPD (chronic obstructive pulmonary disease) ([place]) 05/26/2014  . Insulin dependent diabetes with renal manifestation ([place]), on glimepiride, Lantus, Januvia 05/26/2014  . Former smoker 05/26/2014  . GERD (gastroesophageal reflux disease) 05/26/2014  . 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 Bladder neck obstruction 04/24/2014  .  Recurrent urinary tract infection 09/05/2013  . Retention of urine 09/05/2013   Social History   Tobacco Use  . Smoking status: Former Smoker    Packs/day: 0.10    Years: 2.00    Pack years: 0.20    Types: Cigarettes    Last attempt to quit: 12/31/2007    Years since quitting: 11.0  . Smokeless tobacco: Former Systems developer  . Tobacco comment: smokes 4-5 every other day  Substance Use Topics  . Alcohol use: No  . Drug use: No   Current Medications and Allergies   .   allopurinol (ZYLOPRIM) 100 MG tablet, Take 1 tablet (100 mg total) by mouth daily., Disp: 90 tablet, Rfl: 1 .  ammonium lactate (LAC-HYDRIN) 12 % cream, Apply topically as needed for dry skin., Disp: 385 g, Rfl: 0 .  aspirin EC 81 MG tablet, Take 81 mg by mouth daily. , Disp: , Rfl:  .  budesonide-formoterol (SYMBICORT) 160-4.5 MCG/ACT inhaler, Inhale 2 puffs into the lungs 2 (two) times daily., Disp: 3 Inhaler, Rfl: 3 .  calcium carbonate (CALCIUM 600) 600 MG TABS tablet, Take 600 mg by mouth daily. , Disp: , Rfl:  .  cephALEXin (KEFLEX) 250 MG capsule, Take 1 capsule (250 mg total) by mouth 3 (three) times daily., Disp: 15 capsule, Rfl: 0 .  Cholecalciferol (D 10000) 10000 units CAPS, Take 10,000 Units by mouth 2 (two) times a week. , Disp: , Rfl:  .  clotrimazole-betamethasone (LOTRISONE) cream, Apply 1 application topically 2 (two) times daily., Disp: 45 g, Rfl: 1 .  Coenzyme Q-10 200 MG CAPS, Take 200 mg by mouth daily. , Disp: , Rfl:  .  dorzolamide (TRUSOPT) 2 % ophthalmic solution, , Disp: , Rfl:  .  glimepiride (AMARYL) 4 MG tablet, TAKE 1 TABLET DAILY WITH BREAKFAST., Disp: 90 tablet, Rfl: 1 .  guaiFENesin (MUCINEX) 600 MG 12 hr tablet, Take 1,200 mg by mouth 2 (two) times daily., Disp: , Rfl:  .  HYDROcodone-acetaminophen (NORCO) 10-325 MG tablet, Take 1 tablet by mouth every 8 (eight) hours as needed., Disp: 30 tablet, Rfl: 0 .  hydroxychloroquine (PLAQUENIL) 200 MG tablet, , Disp: , Rfl:  .  Insulin Syringe-Needle U-100 (BD INSULIN SYRINGE ULTRAFINE) 31G X 5/16" 0.5 ML MISC, 1 each by Does not apply route daily., Disp: 100 each, Rfl: 1 .  Insulin Syringe-Needle U-100 (RELION INSULIN SYRINGE 1ML/31G) 31G X 5/16" 1 ML MISC, 1 Syringe by Does not apply route 3 (three) times daily., Disp: 300 each, Rfl: 1 .  irbesartan (AVAPRO) 75 MG tablet, Take 1 tablet (75 mg total) by mouth daily., Disp: 90 tablet, Rfl: 3 .  ketorolac (ACULAR) 0.5 % ophthalmic solution, , Disp: , Rfl:  .  LANTUS 100  UNIT/ML injection, INJECT  50 UNITS SUBCUTANEOUSLY AT BEDTIME, Disp: 50 mL, Rfl: 0 .  metoprolol (TOPROL-XL) 50 MG 24 hr tablet, One tab daily, Disp: 90 tablet, Rfl: 0 .  nystatin (MYCOSTATIN) 100000 UNIT/ML suspension, Take 5 mLs (500,000 Units total) by mouth 4 (four) times daily., Disp: 60 mL, Rfl: 0 .  OXYGEN, Inhale 1.5 L into the lungs., Disp: , Rfl:  .  prednisoLONE acetate (PRED FORTE) 1 % ophthalmic suspension, , Disp: , Rfl: 0 .  sitaGLIPtin (JANUVIA) 50 MG tablet, 1 tab daily, Disp: 90 tablet, Rfl: 3 .  SPIRIVA RESPIMAT 2.5 MCG/ACT AERS, INHALE 2 PUFFS INTO THE LUNGS DAILY., Disp: 12 g, Rfl: 3 .  Syringe, Disposable, 1 ML MISC, Use with insulin needle., Disp: 100  each, Rfl: 3 .  Torsemide (DEMADEX PO), Take 25 mg by mouth., Disp: , Rfl:  .  Travoprost, BAK Free, (TRAVATAN Z) 0.004 % SOLN ophthalmic solution, Place 1 drop in both eyes once daily, Disp: , Rfl:  .  atorvastatin (LIPITOR) 10 MG tablet, TAKE 1 TABLET EVERY DAY, Disp: 90 tablet, Rfl: 0  No Known Allergies   Review of Systems   Pertinent items are noted in the HPI. Otherwise, a complete ROS is negative.  Vitals   Vitals:   01/11/19 1449  BP: 124/82  Pulse: 89  Temp: 98.6 F (37 C)  TempSrc: Oral  SpO2: 96%  Weight: 174 lb (78.9 kg)  Height: 5\' 3"  (1.6 m)     Body mass index is 30.82 kg/m.  Physical Exam   Physical Exam Vitals signs and nursing note reviewed.  Constitutional:      General: He is not in acute distress.    Appearance: He is well-developed. He is not diaphoretic.  HENT:     Head: Normocephalic and atraumatic.     Right Ear: External ear normal.     Left Ear: External ear normal.     Nose: Nose normal.     Mouth/Throat:     Mouth: Mucous membranes are moist.  Eyes:     Conjunctiva/sclera: Conjunctivae normal.     Pupils: Pupils are equal, round, and reactive to light.  Neck:     Musculoskeletal: Neck supple.  Cardiovascular:     Rate and Rhythm: Normal rate and regular rhythm.   Pulmonary:     Effort: Pulmonary effort is normal. No respiratory distress.     Breath sounds: Decreased breath sounds present.  Abdominal:     General: Bowel sounds are normal.     Palpations: Abdomen is soft.     Comments: Prune belly.   Musculoskeletal: Normal range of motion.  Skin:    General: Skin is warm.  Neurological:     Mental Status: He is alert.  Psychiatric:        Behavior: Behavior normal.    Assessment and Plan   Ezeriah was seen today for follow-up.  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 (HCC)  Insulin dependent diabetes with renal manifestation ([place]), on glimepiride, Lantus, Januvia -     Comprehensive metabolic panel -     Hemoglobin A1c  Chronic respiratory failure with hypoxia ([place]), on O2 at home, followed by Pulmonology  CKD (chronic kidney disease) stage 3, GFR 30-59 ml/min ([place]), followed by Renal -     CBC with Differential/Platelet -     Magnesium -     Phosphorus  Morbid obesity (HCC)  Chronic gout without tophus, unspecified cause, unspecified site  Fatigue, unspecified type -     TSH  B12 deficiency -     Vitamin B12  History of thrush -     nystatin (MYCOSTATIN) 100000 UNIT/ML suspension; Take 5 mLs (500,000 Units total) by mouth 4 (four) times daily.  Chronic obstructive pulmonary disease, unspecified COPD type (HCC)  Oral ulcer -     triamcinolone (KENALOG) 0.1 % paste; Use as directed 1 application in the mouth or throat 2 (two) times daily.    . Orders and follow up as documented in [place], reviewed diet, exercise and weight control, cardiovascular risk and specific lipid/LDL goals reviewed, reviewed medications and side effects in detail.  . Reviewed expectations re: course of current  medical issues. . Outlined signs and symptoms indicating need for more acute intervention. . Patient verbalized understanding and all questions were  answered. . Patient received an After Visit Summary.  CMA served as Education administrator during this visit. History, Physical, and Plan performed by medical provider. The above documentation has been reviewed and is accurate and complete. Briscoe Deutscher, D.O.  Records requested if needed. Time spent: 25 minutes, of which >50% was spent in obtaining information about his symptoms, reviewing his previous labs, evaluations, and treatments, counseling him about his condition (please see the discussed topics above), and developing a plan to further investigate it; he had a number of questions which I addressed.   Briscoe Deutscher, DO [place] 01/14/2019

## 2019-01-11 ENCOUNTER — Encounter: Payer: Self-pay | Admitting: Family Medicine

## 2019-01-11 ENCOUNTER — Ambulatory Visit (INDEPENDENT_AMBULATORY_CARE_PROVIDER_SITE_OTHER): Payer: Medicare Other | Admitting: Family Medicine

## 2019-01-11 ENCOUNTER — Other Ambulatory Visit: Payer: Self-pay

## 2019-01-11 VITALS — BP 124/82 | HR 89 | Temp 98.6°F | Ht 63.0 in | Wt 174.0 lb

## 2019-01-11 DIAGNOSIS — E1122 Type 2 diabetes mellitus with diabetic chronic kidney disease: Secondary | ICD-10-CM

## 2019-01-11 DIAGNOSIS — J449 Chronic obstructive pulmonary disease, unspecified: Secondary | ICD-10-CM | POA: Diagnosis not present

## 2019-01-11 DIAGNOSIS — M1A9XX0 Chronic gout, unspecified, without tophus (tophi): Secondary | ICD-10-CM | POA: Diagnosis not present

## 2019-01-11 DIAGNOSIS — Z8619 Personal history of other infectious and parasitic diseases: Secondary | ICD-10-CM | POA: Diagnosis not present

## 2019-01-11 DIAGNOSIS — K121 Other forms of stomatitis: Secondary | ICD-10-CM | POA: Diagnosis not present

## 2019-01-11 DIAGNOSIS — E538 Deficiency of other specified B group vitamins: Secondary | ICD-10-CM

## 2019-01-11 DIAGNOSIS — N183 Chronic kidney disease, stage 3 unspecified: Secondary | ICD-10-CM

## 2019-01-11 DIAGNOSIS — Z794 Long term (current) use of insulin: Secondary | ICD-10-CM

## 2019-01-11 DIAGNOSIS — I129 Hypertensive chronic kidney disease with stage 1 through stage 4 chronic kidney disease, or unspecified chronic kidney disease: Secondary | ICD-10-CM | POA: Diagnosis not present

## 2019-01-11 DIAGNOSIS — E1129 Type 2 diabetes mellitus with other diabetic kidney complication: Secondary | ICD-10-CM | POA: Diagnosis not present

## 2019-01-11 DIAGNOSIS — J9611 Chronic respiratory failure with hypoxia: Secondary | ICD-10-CM

## 2019-01-11 DIAGNOSIS — IMO0001 Reserved for inherently not codable concepts without codable children: Secondary | ICD-10-CM

## 2019-01-11 DIAGNOSIS — R5383 Other fatigue: Secondary | ICD-10-CM | POA: Diagnosis not present

## 2019-01-11 MED ORDER — NYSTATIN 100000 UNIT/ML MT SUSP: 5.0000 mL | Freq: Four times a day (QID) | OROMUCOSAL | 0 refills | Status: DC

## 2019-01-11 MED ORDER — TRIAMCINOLONE ACETONIDE 0.1 % MT PSTE: 1.0000 "application " | PASTE | Freq: Two times a day (BID) | OROMUCOSAL | 12 refills | Status: AC

## 2019-01-12 ENCOUNTER — Other Ambulatory Visit: Payer: Self-pay | Admitting: Cardiology

## 2019-01-12 LAB — COMPREHENSIVE METABOLIC PANEL
ALT: 18 U/L (ref 0–53)
AST: 17 U/L (ref 0–37)
Albumin: 3.7 g/dL (ref 3.5–5.2)
Alkaline Phosphatase: 68 U/L (ref 39–117)
BUN: 52 mg/dL — ABNORMAL HIGH (ref 6–23)
CO2: 23 mEq/L (ref 19–32)
Calcium: 8.7 mg/dL (ref 8.4–10.5)
Chloride: 102 mEq/L (ref 96–112)
Creatinine, Ser: 2.02 mg/dL — ABNORMAL HIGH (ref 0.40–1.50)
GFR: 32.24 mL/min — ABNORMAL LOW (ref >60.00)
Glucose, Bld: 96 mg/dL (ref 70–99)
Potassium: 4.6 mEq/L (ref 3.5–5.1)
Sodium: 134 mEq/L — ABNORMAL LOW (ref 135–145)
Total Bilirubin: 0.3 mg/dL (ref 0.2–1.2)
Total Protein: 7.2 g/dL (ref 6.0–8.3)

## 2019-01-12 LAB — CBC WITH DIFFERENTIAL/PLATELET
Basophils Absolute: 0.1 10*3/uL (ref 0.0–0.1)
Basophils Relative: 0.9 % (ref 0.0–3.0)
Eosinophils Absolute: 0.2 10*3/uL (ref 0.0–0.7)
Eosinophils Relative: 1.9 % (ref 0.0–5.0)
HCT: 39.5 % (ref 39.0–52.0)
Hemoglobin: 13.3 g/dL (ref 13.0–17.0)
Lymphocytes Relative: 15.7 % (ref 12.0–46.0)
Lymphs Abs: 1.7 10*3/uL (ref 0.7–4.0)
MCHC: 33.6 g/dL (ref 30.0–36.0)
MCV: 94.5 fl (ref 78.0–100.0)
Monocytes Absolute: 0.7 10*3/uL (ref 0.1–1.0)
Monocytes Relative: 6.1 % (ref 3.0–12.0)
Neutro Abs: 8.1 10*3/uL — ABNORMAL HIGH (ref 1.4–7.7)
Neutrophils Relative %: 75.4 % (ref 43.0–77.0)
Platelets: 156 10*3/uL (ref 150.0–400.0)
RBC: 4.18 Mil/uL — ABNORMAL LOW (ref 4.22–5.81)
RDW: 16 % — ABNORMAL HIGH (ref 11.5–15.5)
WBC: 10.7 10*3/uL — ABNORMAL HIGH (ref 4.0–10.5)

## 2019-01-12 LAB — VITAMIN B12: Vitamin B-12: >1500 pg/mL — ABNORMAL HIGH (ref 211–911)

## 2019-01-12 LAB — HEMOGLOBIN A1C: Hgb A1c MFr Bld: 6.8 % — ABNORMAL HIGH (ref 4.6–6.5)

## 2019-01-12 LAB — TSH: TSH: 0.73 u[IU]/mL (ref 0.35–4.50)

## 2019-01-12 LAB — MAGNESIUM: Magnesium: 2.2 mg/dL (ref 1.5–2.5)

## 2019-01-12 LAB — PHOSPHORUS: Phosphorus: 5.1 mg/dL — ABNORMAL HIGH (ref 2.3–4.6)

## 2019-01-14 ENCOUNTER — Other Ambulatory Visit: Payer: Self-pay

## 2019-01-14 ENCOUNTER — Encounter: Payer: Self-pay | Admitting: Family Medicine

## 2019-01-14 DIAGNOSIS — IMO0001 Reserved for inherently not codable concepts without codable children: Secondary | ICD-10-CM

## 2019-01-14 MED ORDER — ALLOPURINOL 100 MG PO TABS: 100.0000 mg | ORAL_TABLET | Freq: Every day | ORAL | 1 refills | Status: AC

## 2019-01-14 MED ORDER — SITAGLIPTIN PHOSPHATE 50 MG PO TABS: ORAL_TABLET | ORAL | 3 refills | Status: AC

## 2019-01-17 ENCOUNTER — Telehealth: Payer: Self-pay | Admitting: Family Medicine

## 2019-01-17 ENCOUNTER — Other Ambulatory Visit: Payer: Self-pay

## 2019-01-17 NOTE — Telephone Encounter (Signed)
See lab notes for documentation  

## 2019-01-17 NOTE — Telephone Encounter (Signed)
We have as 25mg  and has never been sent in by [place]. Ok to fill?

## 2019-01-17 NOTE — Telephone Encounter (Signed)
Copied from [place] [phone] [id number]. Topic: Quick Communication - Rx Refill/Question >> Jan 17, 2019  1:47 PM Virl Axe D wrote: Medication: Torsemide (DEMADEX PO) / Pt stated he was taking 100mg   Has the patient contacted their pharmacy? No. (Agent: If no, request that the patient contact the pharmacy for the refill.) (Agent: If yes, when and what did the pharmacy advise?)  Preferred Pharmacy (with phone number or street name): [place], [place] [phone] (Phone) [phone] (Fax)    Agent: Please be advised that RX refills may take up to 3 business days. We ask that you follow-up with your pharmacy.

## 2019-01-17 NOTE — Telephone Encounter (Signed)
See request °

## 2019-01-17 NOTE — Telephone Encounter (Signed)
Copied from [place] [id number]. Topic: General - Other >> Jan 14, 2019  4:58 PM Parke Poisson wrote: Reason for CRM: Pt would like to get lab results.Number listed in chart is correct

## 2019-01-18 ENCOUNTER — Other Ambulatory Visit: Payer: Self-pay

## 2019-01-18 MED ORDER — TORSEMIDE 20 MG PO TABS: 100.0000 mg | ORAL_TABLET | Freq: Every day | ORAL | 3 refills | Status: DC

## 2019-01-18 NOTE — Telephone Encounter (Signed)
Okay to fill as requested by patient. He is a retired Film/video editor.

## 2019-01-18 NOTE — Telephone Encounter (Signed)
Refill called in for patient.  

## 2019-01-24 ENCOUNTER — Telehealth: Payer: Self-pay

## 2019-01-24 NOTE — Telephone Encounter (Signed)
Received request for 2 new rx for Allopurinol and Januvia.  Both were just filled on 6/5.  Spoke to [place] L who confirmed receipt of both rx's that were sent in on 6/5. Order # was [id number]

## 2019-01-25 ENCOUNTER — Ambulatory Visit: Payer: Self-pay | Admitting: Cardiology

## 2019-02-01 ENCOUNTER — Ambulatory Visit: Payer: Medicare Other | Admitting: Podiatry

## 2019-02-02 ENCOUNTER — Other Ambulatory Visit: Payer: Self-pay | Admitting: Family Medicine

## 2019-02-02 DIAGNOSIS — E1122 Type 2 diabetes mellitus with diabetic chronic kidney disease: Secondary | ICD-10-CM

## 2019-02-08 DIAGNOSIS — H401134 Primary open-angle glaucoma, bilateral, indeterminate stage: Secondary | ICD-10-CM | POA: Diagnosis not present

## 2019-02-08 DIAGNOSIS — H04123 Dry eye syndrome of bilateral lacrimal glands: Secondary | ICD-10-CM | POA: Diagnosis not present

## 2019-02-25 ENCOUNTER — Telehealth: Payer: Self-pay | Admitting: Family Medicine

## 2019-02-25 NOTE — Telephone Encounter (Signed)
Dr. [name], pt requesting refill for Diflucan but I do not see that you have ever ordered it. Please advise.

## 2019-02-25 NOTE — Telephone Encounter (Signed)
Pt called to request rx of Diflucan 100mg  15 tablets. Please advise.  [place], [place]. [phone] (Phone) [phone] (Fax)

## 2019-02-26 MED ORDER — FLUCONAZOLE 100 MG PO TABS: 100.0000 mg | ORAL_TABLET | Freq: Every day | ORAL | 2 refills | Status: DC

## 2019-02-26 NOTE — Telephone Encounter (Signed)
Sent!

## 2019-03-03 DIAGNOSIS — I2510 Atherosclerotic heart disease of native coronary artery without angina pectoris: Secondary | ICD-10-CM | POA: Insufficient documentation

## 2019-03-03 DIAGNOSIS — I259 Chronic ischemic heart disease, unspecified: Secondary | ICD-10-CM | POA: Insufficient documentation

## 2019-03-03 DIAGNOSIS — E7800 Pure hypercholesterolemia, unspecified: Secondary | ICD-10-CM | POA: Insufficient documentation

## 2019-03-03 NOTE — Progress Notes (Signed)
Subjective:  Primary Physician:  Briscoe Deutscher, DO  Patient ID: Lucas Price, male    DOB: Mar 11, 1941, 78 y.o.   MRN: [id number]  Chief Complaint  Patient presents with  . Ischemic heart disease    6 month f/u     HPI: Lucas Price  is a 78 y.o. male from [place]. He has history of gradually worsening shortness of breath for about 20 years. He becomes short of breath on walking for just a few steps in the house. He also complains of orthopnea. Patient has chronic lung disease, exact nature of chronic lung disease is unclear. He has history of asthma. He also has chronic hypoxia. Patient has been using home oxygen at night - 1.5 L.  He underwent chest CT scans in 2018 & 2019 for workup for lung disease. It revealed 2 lung nodules and also revealed aortic atherosclerosis as well as calcification in left main and 2 coronary arteries. He had bronchoscopic biopsy which was negative and cytology was negative for malignancy. Patient was told to have had atelectasis.  Patient denies any complaints of chest pain, tightness or pressure. No complaints of palpitation. No dizziness, near-syncope or syncope. He has chronic swelling on the legs. No history of leg claudication.  Patient has diabetes mellitus type 2. He has h/o hypertension. No history of high cholesterol, but, has been on atorvastatin therapy due to CAD. He is former smoker, quit smoking in 2009.  The patient was told, he was born with Prune belly syndrome. He always had very protuberant abdomen. Patient also has history of chronic kidney disease, anemia, GERD and history of gout. No history of thyroid problems. No history of TIA or CVA.  He is retired Film/video editor. Patient had practiced in [place], [place]. He retired in 2011.   Past Medical History:  Diagnosis Date  . Anemia 05/26/2014  . Bladder neck obstruction 04/24/2014  . Chronic respiratory failure with hypoxia ([place])   . CKD (chronic kidney disease)  05/26/2014  . COPD (chronic obstructive pulmonary disease) ([place]) 05/26/2014  . Coronary artery calcification seen on CAT scan   . Diabetes mellitus ([place]) 05/26/2014  . Former smoker 05/26/2014  . GERD (gastroesophageal reflux disease) 05/26/2014  . Glaucoma   . Heart murmur    mild  . HTN (hypertension) 05/26/2014  . Ischemic heart disease    Lexiscan myoview stress test 02/08/2018 - no stress sx reported.  . Lung nodules   . 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 Recurrent urinary tract infection 09/05/2013  . Renal calculus 03/19/2015  . Retention of urine 09/05/2013  . Sleep apnea    wears Bipap  . SOB (shortness of breath)     Past Surgical History:  Procedure Laterality Date  . BLADDER NECK RECONSTRUCTION    . CATARACT EXTRACTION, BILATERAL    . COLONOSCOPY WITH PROPOFOL N/A 09/22/2017   Procedure: COLONOSCOPY WITH PROPOFOL;  Surgeon: Lucas Stabler, MD;  Location: [facility];  Service: Gastroenterology;  Laterality: N/A;  . HERNIA REPAIR Bilateral    Inguinal  . VIDEO BRONCHOSCOPY WITH ENDOBRONCHIAL NAVIGATION N/A 10/07/2017   Procedure: VIDEO BRONCHOSCOPY WITH ENDOBRONCHIAL NAVIGATION;  Surgeon: Collene Gobble, MD;  Location: [facility];  Service: Thoracic;  Laterality: N/A;  . VIDEO BRONCHOSCOPY WITH ENDOBRONCHIAL ULTRASOUND N/A 10/07/2017   Procedure: VIDEO BRONCHOSCOPY WITH ENDOBRONCHIAL ULTRASOUND;  Surgeon: Collene Gobble, MD;  Location: [facility];  Service: Thoracic;  Laterality: N/A;    Social History  Socioeconomic History  . Marital status: Married    Spouse name: Not on file  . Number of children: Not on file  . Years of education: Not on file  . Highest education level: Not on file  Occupational History  . Not on file  Social Needs  . Financial resource strain: Not on file  . Food insecurity    Worry: Not on file    Inability: Not on file  . Transportation needs    Medical: Not on file    Non-medical: Not on file  Tobacco  Use  . Smoking status: Former Smoker    Packs/day: 0.10    Years: 2.00    Pack years: 0.20    Types: Cigarettes    Quit date: 12/31/2007    Years since quitting: 11.1  . Smokeless tobacco: Former Systems developer  . Tobacco comment: smokes 4-5 every other day  Substance and Sexual Activity  . Alcohol use: No  . Drug use: No  . Sexual activity: Yes    Partners: Female  Lifestyle  . Physical activity    Days per week: Not on file    Minutes per session: Not on file  . Stress: Not on file  Relationships  . Social Herbalist on phone: Not on file    Gets together: Not on file    Attends religious service: Not on file    Active member of club or organization: Not on file    Attends meetings of clubs or organizations: Not on file    Relationship status: Not on file  . Intimate partner violence    Fear of current or ex partner: Not on file    Emotionally abused: Not on file    Physically abused: Not on file    Forced sexual activity: Not on file  Other Topics Concern  . Not on file  Social History Narrative  . Not on file   Current Outpatient Medications on File Prior to Visit  Medication Sig Dispense Refill  . allopurinol (ZYLOPRIM) 100 MG tablet Take 1 tablet (100 mg total) by mouth daily. 90 tablet 1  . aspirin EC 81 MG tablet Take 81 mg by mouth daily.     [name] atorvastatin (LIPITOR) 10 MG tablet TAKE 1 TABLET EVERY DAY 90 tablet 0  . budesonide-formoterol (SYMBICORT) 160-4.5 MCG/ACT inhaler Inhale 2 puffs into the lungs 2 (two) times daily. 3 Inhaler 3  . calcium carbonate (CALCIUM 600) 600 MG TABS tablet Take 600 mg by mouth daily.     . Cholecalciferol (D 10000) 10000 units CAPS Take 10,000 Units by mouth 2 (two) times a week.     . clotrimazole (MYCELEX) 10 MG troche     . dorzolamide (TRUSOPT) 2 % ophthalmic solution     . fluconazole (DIFLUCAN) 100 MG tablet Take 1 tablet (100 mg total) by mouth daily. 15 tablet 2  . glimepiride (AMARYL) 4 MG tablet TAKE 1 TABLET DAILY  WITH BREAKFAST. 90 tablet 1  . guaiFENesin (MUCINEX) 600 MG 12 hr tablet Take 1,200 mg by mouth 2 (two) times daily.    [name] LANTUS 100 UNIT/ML injection INJECT  50 UNITS SUBCUTANEOUSLY AT BEDTIME 50 mL 0  . metoprolol succinate (TOPROL-XL) 50 MG 24 hr tablet TAKE 1 TABLET EVERY DAY 90 tablet 0  . nystatin (MYCOSTATIN) 100000 UNIT/ML suspension Take 5 mLs (500,000 Units total) by mouth 4 (four) times daily. 60 mL 0  . OXYGEN Inhale 1.5 L into the lungs.    [name]  sitaGLIPtin (JANUVIA) 50 MG tablet 1 tab daily 90 tablet 3  . SPIRIVA RESPIMAT 2.5 MCG/ACT AERS INHALE 2 PUFFS INTO THE LUNGS DAILY. 12 g 3  . torsemide (DEMADEX) 20 MG tablet Take 5 tablets (100 mg total) by mouth daily. (Patient taking differently: Take 20 mg by mouth daily. ) 150 tablet 3  . Travoprost, BAK Free, (TRAVATAN Z) 0.004 % SOLN ophthalmic solution Place 1 drop in both eyes once daily    . triamcinolone (KENALOG) 0.1 % paste Use as directed 1 application in the mouth or throat 2 (two) times daily. 5 g 12  . ammonium lactate (LAC-HYDRIN) 12 % cream Apply topically as needed for dry skin. 385 g 0  . cephALEXin (KEFLEX) 250 MG capsule Take 1 capsule (250 mg total) by mouth 3 (three) times daily. (Patient not taking: Reported on 03/09/2019) 15 capsule 0  . clotrimazole-betamethasone (LOTRISONE) cream Apply 1 application topically 2 (two) times daily. 45 g 1  . Coenzyme Q-10 200 MG CAPS Take 200 mg by mouth daily.     [name] HYDROcodone-acetaminophen (NORCO) 10-325 MG tablet Take 1 tablet by mouth every 8 (eight) hours as needed. (Patient not taking: Reported on 03/09/2019) 30 tablet 0  . hydroxychloroquine (PLAQUENIL) 200 MG tablet     . Insulin Syringe-Needle U-100 (BD INSULIN SYRINGE ULTRAFINE) 31G X 5/16" 0.5 ML MISC 1 each by Does not apply route daily. 100 each 1  . Insulin Syringe-Needle U-100 (RELION INSULIN SYRINGE 1ML/31G) 31G X 5/16" 1 ML MISC 1 Syringe by Does not apply route 3 (three) times daily. 300 each 1  . ketorolac (ACULAR)  0.5 % ophthalmic solution     . prednisoLONE acetate (PRED FORTE) 1 % ophthalmic suspension   0  . Syringe, Disposable, 1 ML MISC Use with insulin needle. 100 each 3   No current facility-administered medications on file prior to visit.     Review of Systems  Constitutional: Negative for fever.  HENT: Negative for nosebleeds.   Eyes: Negative for blurred vision.  Respiratory: Positive for cough (chronic) and shortness of breath (chronic).   Cardiovascular: Positive for leg swelling (chronic). Negative for chest pain and palpitations.  Gastrointestinal: Negative for abdominal pain, nausea and vomiting.  Genitourinary: Negative for dysuria.  Musculoskeletal: Negative for myalgias.  Skin: Negative for itching and rash.  Neurological: Negative for dizziness, seizures and loss of consciousness.  Psychiatric/Behavioral: The patient is not nervous/anxious.       Objective:  Blood pressure (!) 115/59, pulse 83, temperature 98.2 F (36.8 C), height 5\' 3"  (1.6 m), weight 170 lb 11.2 oz (77.4 kg), SpO2 (!) 81 %. Body mass index is 30.24 kg/m.  Physical Exam  Constitutional: He is oriented to person, place, and time. He appears well-developed.  Obese  HENT:  Head: Normocephalic and atraumatic.  Eyes: Conjunctivae are normal.  Neck: No thyromegaly present.  Cardiovascular: Normal rate and regular rhythm. Exam reveals no gallop.  Murmur (Gr. 1/6 ESM in aortic area and LPSA) heard. Pulses:      Carotid pulses are 2+ on the right side and 2+ on the left side.      Dorsalis pedis pulses are 1+ on the right side and 1+ on the left side.       Posterior tibial pulses are 1+ on the right side and 1+ on the left side.  Pulmonary/Chest: He has no wheezes. He has no rales.  Abdominal: He exhibits no mass. There is no abdominal tenderness.  Musculoskeletal:  General: Edema (mild edema around ankles and feet) present.  Lymphadenopathy:    He has no cervical adenopathy.  Neurological: He is  alert and oriented to person, place, and time.  Skin: Skin is warm.   CARDIAC STUDIES:  Lexiscan myoview stress test 02/08/2018: 1. Lexiscan stress test was performed. Exercise capacity was not assessed. No stress symptoms reported. Normal blood pressure. The resting and stress electrocardiogram demonstrated normal sinus rhythm, normal resting conduction, no resting arrhythmias and normal rest repolarization. Stress EKG is non diagnostic for ischemia as it is a pharmacologic stress. 2.The overall quality of the study is excellent. Left ventricular cavity is noted to be normal on the rest and stress studies. Gated SPECT images reveal normal myocardial thickening and wall motion. The left ventricular ejection fraction was calculated or visually estimated to be 79%. Medium-sized, moderately intensity, perfusion defect in basal to apical inferior, inferolateral myocardium, seen on stress SPECT images, with near complete reversibility on rest images. SDS=6. Findings suggest LCX/PDA territory ischemia. 3. Intermediate risk study.  Echocardiogram 09/15/2017: Left ventricle cavity is normal in size. Mild concentric hypertrophy of the left ventricle. Normal global wall motion. Doppler evidence of grade I (impaired) diastolic dysfunction, normal LAP. Calculated EF 59%. Mild (Grade I) mitral regurgitation. Moderate tricuspid regurgitation. Estimated pulmonary artery systolic pressure 50 mmHg. Mild pulmonic regurgitation.  EKG- 07/27/2018- Sinus rhythm, within normal limits.    Assessment & Recommendations:   1. Ischemic heart disease EKG- Sinus  Rhythm. Within normal limits  2. Chronic respiratory failure with hypoxia ([place]), on O2 at home, followed by Pulmonology  3. 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 Coronary artery calcification seen on CAT scan  Laboratory Exam:  CBC Latest Ref Rng & Units 01/11/2019 10/26/2018 09/14/2018  WBC 4.0 - 10.5  K/uL 10.7(H) 9.6 9.9  Hemoglobin 13.0 - 17.0 g/dL 13.3 13.4 12.4(L)  Hematocrit 39.0 - 52.0 % 39.5 40.3 38.3(L)  Platelets 150.0 - 400.0 K/uL 156.0 149.0(L) 153.0   CMP Latest Ref Rng & Units 01/11/2019 10/26/2018 09/14/2018  Glucose 70 - 99 mg/dL 96 75 110(H)  BUN 6 - 23 mg/dL 52(H) 47(H) 51(H)  Creatinine 0.40 - 1.50 mg/dL 2.02(H) 1.89(H) 1.95(H)  Sodium 135 - 145 mEq/L 134(L) 139 139  Potassium 3.5 - 5.1 mEq/L 4.6 4.9 4.3  Chloride 96 - 112 mEq/L 102 106 105  CO2 19 - 32 mEq/L 23 24 23   Calcium 8.4 - 10.5 mg/dL 8.7 8.9 8.9  Total Protein 6.0 - 8.3 g/dL 7.2 7.0 7.3  Total Bilirubin 0.2 - 1.2 mg/dL 0.3 0.4 0.3  Alkaline Phos 39 - 117 U/L 68 83 82  AST 0 - 37 U/L 17 15 19   ALT 0 - 53 U/L 18 22 19    Lipid Panel     Component Value Date/Time   CHOL 89 06/04/2018 1152   TRIG 89.0 06/04/2018 1152   HDL 35.60 (L) 06/04/2018 1152   CHOLHDL 3 06/04/2018 1152   VLDL 17.8 06/04/2018 1152   LDLCALC 36 06/04/2018 1152   Recommendation:  Patient has history of chronic dyspnea, predominantly due to pulmonary problems. He also has moderate pulmonary hypertension due to chronic pulmonary problems. Ischemic heart disease is stable, patient does not have angina.  We will continue medical therapy. In the past, he had refused for the cardiac catheterization- left and right heart catheterization for evaluation of pulmonary hypertension and coronary angiography.  Blood pressure is Well-controlled.  Patient was advised to follow  ADA, low-cholesterol diet. He will return for follow-up after 6 months but call us earlier if there are any cardiac problems. Total time spent with patient was 30 minutes and greater than 50% of that time was spent in counseling and coordination care with the patient regarding  alternatives of therapy, dietary counseling etc.                               Despina Hick, MD, [facility] 03/09/2019, 5:32 PM [facility] [place] Pager: [phone] Office: [phone] If no  answer Cell [phone]

## 2019-03-09 ENCOUNTER — Other Ambulatory Visit: Payer: Self-pay | Admitting: Cardiology

## 2019-03-09 ENCOUNTER — Other Ambulatory Visit: Payer: Self-pay

## 2019-03-09 ENCOUNTER — Ambulatory Visit (INDEPENDENT_AMBULATORY_CARE_PROVIDER_SITE_OTHER): Payer: Medicare Other | Admitting: Cardiology

## 2019-03-09 ENCOUNTER — Encounter: Payer: Self-pay | Admitting: Cardiology

## 2019-03-09 VITALS — BP 115/59 | HR 83 | Temp 98.2°F | Ht 63.0 in | Wt 170.7 lb

## 2019-03-09 DIAGNOSIS — I259 Chronic ischemic heart disease, unspecified: Secondary | ICD-10-CM

## 2019-03-09 DIAGNOSIS — J9611 Chronic respiratory failure with hypoxia: Secondary | ICD-10-CM

## 2019-03-09 DIAGNOSIS — I2510 Atherosclerotic heart disease of native coronary artery without angina pectoris: Secondary | ICD-10-CM

## 2019-03-09 DIAGNOSIS — E1122 Type 2 diabetes mellitus with diabetic chronic kidney disease: Secondary | ICD-10-CM

## 2019-03-09 DIAGNOSIS — I129 Hypertensive chronic kidney disease with stage 1 through stage 4 chronic kidney disease, or unspecified chronic kidney disease: Secondary | ICD-10-CM

## 2019-03-09 DIAGNOSIS — Q794 Prune belly syndrome: Secondary | ICD-10-CM | POA: Diagnosis not present

## 2019-03-09 DIAGNOSIS — N183 Chronic kidney disease, stage 3 (moderate): Secondary | ICD-10-CM | POA: Diagnosis not present

## 2019-03-28 ENCOUNTER — Other Ambulatory Visit: Payer: Self-pay | Admitting: Family Medicine

## 2019-03-28 DIAGNOSIS — N183 Chronic kidney disease, stage 3 unspecified: Secondary | ICD-10-CM

## 2019-03-28 DIAGNOSIS — E1122 Type 2 diabetes mellitus with diabetic chronic kidney disease: Secondary | ICD-10-CM

## 2019-03-29 NOTE — Telephone Encounter (Signed)
Last fill 02/03/19  #90/0 Last OV 01/11/19

## 2019-05-11 ENCOUNTER — Other Ambulatory Visit: Payer: Self-pay | Admitting: Family Medicine

## 2019-05-11 NOTE — Telephone Encounter (Signed)
See note

## 2019-05-11 NOTE — Telephone Encounter (Signed)
Copied from [place] [phone]. Topic: Quick Communication - Rx Refill/Question >> May 11, 2019  2:02 PM Leward Quan A wrote: Medication: LANTUS 100 UNIT/ML injection, Travoprost, BAK Free, (TRAVATAN Z) 0.004 % SOLN ophthalmic solution   Has the patient contacted their pharmacy? Yes.   (Agent: If no, request that the patient contact the pharmacy for the refill.) (Agent: If yes, when and what did the pharmacy advise?)  Preferred Pharmacy (with phone number or street name): [facility], [place] [phone] (Phone) [phone] (Fax)    Agent: Please be advised that RX refills may take up to 3 business days. We ask that you follow-up with your pharmacy.

## 2019-05-11 NOTE — Telephone Encounter (Signed)
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

## 2019-05-13 MED ORDER — TRAVOPROST (BAK FREE) 0.004 % OP SOLN: OPHTHALMIC | 3 refills | Status: AC

## 2019-05-13 NOTE — Telephone Encounter (Signed)
Last fill 07/15/18  #38ml/0 Last OV 01/11/19

## 2019-05-13 NOTE — Telephone Encounter (Signed)
Last ov 01/11/19 Filled historical provider 03/10/16 Next OV 05/19/19 Ok to refill?

## 2019-05-19 ENCOUNTER — Telehealth: Payer: Self-pay | Admitting: Family Medicine

## 2019-05-19 ENCOUNTER — Encounter: Payer: Self-pay | Admitting: Family Medicine

## 2019-05-19 ENCOUNTER — Other Ambulatory Visit: Payer: Self-pay

## 2019-05-19 ENCOUNTER — Ambulatory Visit (INDEPENDENT_AMBULATORY_CARE_PROVIDER_SITE_OTHER): Payer: Medicare Other

## 2019-05-19 DIAGNOSIS — Z23 Encounter for immunization: Secondary | ICD-10-CM | POA: Diagnosis not present

## 2019-05-19 NOTE — Telephone Encounter (Signed)
Copied from [place] [phone]. Topic: General - Inquiry >> May 19, 2019 12:17 PM Richardo Priest, NT wrote: Reason for CRM: Patient called in stating he is wanting to add labs for appointment today at 3:45. Patient wanting a renal labs drawn as well as CBC with diff. Please advise.

## 2019-05-20 ENCOUNTER — Other Ambulatory Visit (INDEPENDENT_AMBULATORY_CARE_PROVIDER_SITE_OTHER): Payer: Medicare Other

## 2019-05-20 ENCOUNTER — Other Ambulatory Visit: Payer: Self-pay

## 2019-05-20 DIAGNOSIS — E1122 Type 2 diabetes mellitus with diabetic chronic kidney disease: Secondary | ICD-10-CM

## 2019-05-20 DIAGNOSIS — N183 Chronic kidney disease, stage 3 unspecified: Secondary | ICD-10-CM | POA: Diagnosis not present

## 2019-05-20 DIAGNOSIS — I129 Hypertensive chronic kidney disease with stage 1 through stage 4 chronic kidney disease, or unspecified chronic kidney disease: Secondary | ICD-10-CM | POA: Diagnosis not present

## 2019-05-20 LAB — CBC WITH DIFFERENTIAL/PLATELET
Basophils Absolute: 0 10*3/uL (ref 0.0–0.1)
Basophils Relative: 0.2 % (ref 0.0–3.0)
Eosinophils Absolute: 0.2 10*3/uL (ref 0.0–0.7)
Eosinophils Relative: 1.5 % (ref 0.0–5.0)
HCT: 38.2 % — ABNORMAL LOW (ref 39.0–52.0)
Hemoglobin: 12.5 g/dL — ABNORMAL LOW (ref 13.0–17.0)
Lymphocytes Relative: 19.3 % (ref 12.0–46.0)
Lymphs Abs: 2.1 10*3/uL (ref 0.7–4.0)
MCHC: 32.7 g/dL (ref 30.0–36.0)
MCV: 98.3 fl (ref 78.0–100.0)
Monocytes Absolute: 0.7 10*3/uL (ref 0.1–1.0)
Monocytes Relative: 6.2 % (ref 3.0–12.0)
Neutro Abs: 7.8 10*3/uL — ABNORMAL HIGH (ref 1.4–7.7)
Neutrophils Relative %: 72.8 % (ref 43.0–77.0)
Platelets: 206 10*3/uL (ref 150.0–400.0)
RBC: 3.89 Mil/uL — ABNORMAL LOW (ref 4.22–5.81)
RDW: 14.5 % (ref 11.5–15.5)
WBC: 10.7 10*3/uL — ABNORMAL HIGH (ref 4.0–10.5)

## 2019-05-20 LAB — COMPREHENSIVE METABOLIC PANEL
ALT: 27 U/L (ref 0–53)
AST: 21 U/L (ref 0–37)
Albumin: 3.6 g/dL (ref 3.5–5.2)
Alkaline Phosphatase: 82 U/L (ref 39–117)
BUN: 46 mg/dL — ABNORMAL HIGH (ref 6–23)
CO2: 27 mEq/L (ref 19–32)
Calcium: 9.7 mg/dL (ref 8.4–10.5)
Chloride: 103 mEq/L (ref 96–112)
Creatinine, Ser: 1.85 mg/dL — ABNORMAL HIGH (ref 0.40–1.50)
GFR: 35.65 mL/min — ABNORMAL LOW (ref >60.00)
Glucose, Bld: 102 mg/dL — ABNORMAL HIGH (ref 70–99)
Potassium: 4.8 mEq/L (ref 3.5–5.1)
Sodium: 137 mEq/L (ref 135–145)
Total Bilirubin: 0.4 mg/dL (ref 0.2–1.2)
Total Protein: 7.9 g/dL (ref 6.0–8.3)

## 2019-05-20 NOTE — Telephone Encounter (Signed)
Called patient lab app made.

## 2019-05-23 DIAGNOSIS — N2581 Secondary hyperparathyroidism of renal origin: Secondary | ICD-10-CM | POA: Diagnosis not present

## 2019-05-23 DIAGNOSIS — D631 Anemia in chronic kidney disease: Secondary | ICD-10-CM | POA: Diagnosis not present

## 2019-05-23 DIAGNOSIS — I129 Hypertensive chronic kidney disease with stage 1 through stage 4 chronic kidney disease, or unspecified chronic kidney disease: Secondary | ICD-10-CM | POA: Diagnosis not present

## 2019-05-23 DIAGNOSIS — N183 Chronic kidney disease, stage 3 unspecified: Secondary | ICD-10-CM | POA: Diagnosis not present

## 2019-05-26 ENCOUNTER — Other Ambulatory Visit: Payer: Self-pay | Admitting: Cardiology

## 2019-05-26 ENCOUNTER — Telehealth: Payer: Self-pay | Admitting: Family Medicine

## 2019-05-26 NOTE — Telephone Encounter (Signed)
I called the patient to schedule AWV w/ Loma Sousa, but he asked if he can call back. VDM (Dee-Dee)

## 2019-05-29 NOTE — Progress Notes (Deleted)
Lucas Price is a 78 y.o. male is here for follow up.  History of Present Illness:   (SCRIBE ATTESTATION)  HPI:   Health Maintenance Due  Topic Date Due  . FOOT EXAM  03/10/2019   Depression screen PHQ 2/9 06/05/2018 04/29/2017  Decreased Interest 0 0  Down, Depressed, Hopeless 0 0  PHQ - 2 Score 0 0   PMHx, SurgHx, SocialHx, FamHx, Medications, and Allergies were reviewed in the Visit Navigator and updated as appropriate.   Patient Active Problem List   Diagnosis Date Noted  . Ischemic heart disease 03/03/2019  . Coronary artery calcification seen on CAT scan 03/03/2019  . Morbid obesity ([place]) 06/06/2018  . 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 Rhinitis, chronic 01/12/2018  . 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 Chronic respiratory failure with hypoxia ([place]), on O2 at home, followed by Pulmonology 11/05/2017  . 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 Abnormal finding on GI tract imaging   . Benign neoplasm of ascending colon   . Pulmonary nodules/lesions, multiple 09/08/2017  . 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 Renal calculus 03/19/2015  . Anemia in chronic kidney disease (CKD) 05/26/2014  . CKD (chronic kidney disease) stage 3, GFR 30-59 ml/min ([place]), followed by Renal 05/26/2014  . COPD (chronic obstructive pulmonary disease) ([place]) 05/26/2014  . Insulin dependent diabetes with renal manifestation ([place]), on glimepiride, Lantus, Januvia 05/26/2014  . Former smoker 05/26/2014  . GERD (gastroesophageal reflux disease) 05/26/2014  . 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 Bladder neck obstruction 04/24/2014  . Recurrent urinary tract infection 09/05/2013  . Retention of urine 09/05/2013   Social History   Tobacco Use  . Smoking status: Former Smoker    Packs/day: 0.10    Years: 2.00    Pack years: 0.20    Types: Cigarettes    Quit date: 12/31/2007    Years  since quitting: 11.4  . Smokeless tobacco: Former Systems developer  . Tobacco comment: smokes 4-5 every other day  Substance Use Topics  . Alcohol use: No  . Drug use: No   Current Medications and Allergies   Current Outpatient Medications:  .  allopurinol (ZYLOPRIM) 100 MG tablet, Take 1 tablet (100 mg total) by mouth daily., Disp: 90 tablet, Rfl: 1 .  ammonium lactate (LAC-HYDRIN) 12 % cream, Apply topically as needed for dry skin., Disp: 385 g, Rfl: 0 .  aspirin EC 81 MG tablet, Take 81 mg by mouth daily. , Disp: , Rfl:  .  atorvastatin (LIPITOR) 10 MG tablet, TAKE 1 TABLET EVERY DAY, Disp: 90 tablet, Rfl: 0 .  budesonide-formoterol (SYMBICORT) 160-4.5 MCG/ACT inhaler, Inhale 2 puffs into the lungs 2 (two) times daily., Disp: 3 Inhaler, Rfl: 3 .  calcium carbonate (CALCIUM 600) 600 MG TABS tablet, Take 600 mg by mouth daily. , Disp: , Rfl:  .  cephALEXin (KEFLEX) 250 MG capsule, Take 1 capsule (250 mg total) by mouth 3 (three) times daily. (Patient not taking: Reported on 03/09/2019), Disp: 15 capsule, Rfl: 0 .  Cholecalciferol (D 10000) 10000 units CAPS, Take 10,000 Units by mouth 2 (two) times a week. , Disp: , Rfl:  .  clotrimazole (MYCELEX) 10 MG troche, , Disp: , Rfl:  .  clotrimazole-betamethasone (LOTRISONE) cream, Apply 1 application topically 2 (two) times daily., Disp: 45 g, Rfl: 1 .  Coenzyme Q-10 200 MG CAPS, Take 200 mg by mouth daily. , Disp: ,  Rfl:  .  dorzolamide (TRUSOPT) 2 % ophthalmic solution, , Disp: , Rfl:  .  fluconazole (DIFLUCAN) 100 MG tablet, Take 1 tablet (100 mg total) by mouth daily., Disp: 15 tablet, Rfl: 2 .  glimepiride (AMARYL) 4 MG tablet, TAKE 1 TABLET DAILY WITH BREAKFAST., Disp: 90 tablet, Rfl: 1 .  guaiFENesin (MUCINEX) 600 MG 12 hr tablet, Take 1,200 mg by mouth 2 (two) times daily., Disp: , Rfl:  .  HYDROcodone-acetaminophen (NORCO) 10-325 MG tablet, Take 1 tablet by mouth every 8 (eight) hours as needed. (Patient not taking: Reported on 03/09/2019), Disp: 30  tablet, Rfl: 0 .  hydroxychloroquine (PLAQUENIL) 200 MG tablet, , Disp: , Rfl:  .  Insulin Syringe-Needle U-100 (BD INSULIN SYRINGE ULTRAFINE) 31G X 5/16" 0.5 ML MISC, 1 each by Does not apply route daily., Disp: 100 each, Rfl: 1 .  Insulin Syringe-Needle U-100 (RELION INSULIN SYRINGE 1ML/31G) 31G X 5/16" 1 ML MISC, 1 Syringe by Does not apply route 3 (three) times daily., Disp: 300 each, Rfl: 1 .  ketorolac (ACULAR) 0.5 % ophthalmic solution, , Disp: , Rfl:  .  LANTUS 100 UNIT/ML injection, INJECT  50 UNITS SUBCUTANEOUSLY AT BEDTIME, Disp: 50 mL, Rfl: 0 .  metoprolol succinate (TOPROL-XL) 50 MG 24 hr tablet, TAKE 1 TABLET EVERY DAY, Disp: 90 tablet, Rfl: 0 .  nystatin (MYCOSTATIN) 100000 UNIT/ML suspension, Take 5 mLs (500,000 Units total) by mouth 4 (four) times daily., Disp: 60 mL, Rfl: 0 .  OXYGEN, Inhale 1.5 L into the lungs., Disp: , Rfl:  .  prednisoLONE acetate (PRED FORTE) 1 % ophthalmic suspension, , Disp: , Rfl: 0 .  sitaGLIPtin (JANUVIA) 50 MG tablet, 1 tab daily, Disp: 90 tablet, Rfl: 3 .  SPIRIVA RESPIMAT 2.5 MCG/ACT AERS, INHALE 2 PUFFS INTO THE LUNGS DAILY., Disp: 12 g, Rfl: 3 .  Syringe, Disposable, 1 ML MISC, Use with insulin needle., Disp: 100 each, Rfl: 3 .  torsemide (DEMADEX) 20 MG tablet, Take 5 tablets (100 mg total) by mouth daily. (Patient taking differently: Take 20 mg by mouth daily. ), Disp: 150 tablet, Rfl: 3 .  Travoprost, BAK Free, (TRAVATAN Z) 0.004 % SOLN ophthalmic solution, Place 1 drop in both eyes once daily, Disp: 5 mL, Rfl: 3 .  triamcinolone (KENALOG) 0.1 % paste, Use as directed 1 application in the mouth or throat 2 (two) times daily., Disp: 5 g, Rfl: 12  No Known Allergies Review of Systems   Pertinent items are noted in the HPI. Otherwise, a complete ROS is negative.  Vitals  There were no vitals filed for this visit.   There is no height or weight on file to calculate BMI.  Physical Exam   Physical Exam  Results for orders placed or  performed in visit on 05/20/19  Comprehensive metabolic panel  Result Value Ref Range   Sodium 137 135 - 145 mEq/L   Potassium 4.8 3.5 - 5.1 mEq/L   Chloride 103 96 - 112 mEq/L   CO2 27 19 - 32 mEq/L   Glucose, Bld 102 (H) 70 - 99 mg/dL   BUN 46 (H) 6 - 23 mg/dL   Creatinine, Ser 1.85 (H) 0.40 - 1.50 mg/dL   Total Bilirubin 0.4 0.2 - 1.2 mg/dL   Alkaline Phosphatase 82 39 - 117 U/L   AST 21 0 - 37 U/L   ALT 27 0 - 53 U/L   Total Protein 7.9 6.0 - 8.3 g/dL   Albumin 3.6 3.5 - 5.2 g/dL  Calcium 9.7 8.4 - 10.5 mg/dL   GFR 35.65 (L) >60.00 mL/min  CBC with Differential/Platelet  Result Value Ref Range   WBC 10.7 (H) 4.0 - 10.5 K/uL   RBC 3.89 (L) 4.22 - 5.81 Mil/uL   Hemoglobin 12.5 (L) 13.0 - 17.0 g/dL   HCT 38.2 (L) 39.0 - 52.0 %   MCV 98.3 78.0 - 100.0 fl   MCHC 32.7 30.0 - 36.0 g/dL   RDW 14.5 11.5 - 15.5 %   Platelets 206.0 150.0 - 400.0 K/uL   Neutrophils Relative % 72.8 43.0 - 77.0 %   Lymphocytes Relative 19.3 12.0 - 46.0 %   Monocytes Relative 6.2 3.0 - 12.0 %   Eosinophils Relative 1.5 0.0 - 5.0 %   Basophils Relative 0.2 0.0 - 3.0 %   Neutro Abs 7.8 (H) 1.4 - 7.7 K/uL   Lymphs Abs 2.1 0.7 - 4.0 K/uL   Monocytes Absolute 0.7 0.1 - 1.0 K/uL   Eosinophils Absolute 0.2 0.0 - 0.7 K/uL   Basophils Absolute 0.0 0.0 - 0.1 K/uL    Assessment and Plan   There are no diagnoses linked to this encounter.  . Orders and follow up as documented in [place], reviewed diet, exercise and weight control, cardiovascular risk and specific lipid/LDL goals reviewed, reviewed medications and side effects in detail.  . Reviewed expectations re: course of current medical issues. . Outlined signs and symptoms indicating need for more acute intervention. . Patient verbalized understanding and all questions were answered. . Patient received an After Visit Summary.  *** CMA served as Education administrator during this visit. History, Physical, and Plan performed by medical provider. The above  documentation has been reviewed and is accurate and complete. Briscoe Deutscher, D.O.  Briscoe Deutscher, DO [place] 05/29/2019

## 2019-05-30 ENCOUNTER — Encounter: Payer: Self-pay | Admitting: Family Medicine

## 2019-05-30 ENCOUNTER — Ambulatory Visit: Payer: PRIVATE HEALTH INSURANCE | Admitting: Family Medicine

## 2019-05-30 ENCOUNTER — Ambulatory Visit (INDEPENDENT_AMBULATORY_CARE_PROVIDER_SITE_OTHER): Payer: Medicare Other | Admitting: Family Medicine

## 2019-05-30 ENCOUNTER — Telehealth: Payer: Self-pay

## 2019-05-30 ENCOUNTER — Other Ambulatory Visit: Payer: Self-pay

## 2019-05-30 VITALS — BP 110/60 | HR 80 | Temp 98.6°F | Ht 63.0 in | Wt 169.0 lb

## 2019-05-30 DIAGNOSIS — Z794 Long term (current) use of insulin: Secondary | ICD-10-CM

## 2019-05-30 DIAGNOSIS — I129 Hypertensive chronic kidney disease with stage 1 through stage 4 chronic kidney disease, or unspecified chronic kidney disease: Secondary | ICD-10-CM

## 2019-05-30 DIAGNOSIS — K0510 Chronic gingivitis, plaque induced: Secondary | ICD-10-CM

## 2019-05-30 DIAGNOSIS — J9611 Chronic respiratory failure with hypoxia: Secondary | ICD-10-CM

## 2019-05-30 DIAGNOSIS — I259 Chronic ischemic heart disease, unspecified: Secondary | ICD-10-CM

## 2019-05-30 DIAGNOSIS — E1122 Type 2 diabetes mellitus with diabetic chronic kidney disease: Secondary | ICD-10-CM

## 2019-05-30 DIAGNOSIS — J984 Other disorders of lung: Secondary | ICD-10-CM | POA: Diagnosis not present

## 2019-05-30 DIAGNOSIS — N183 Chronic kidney disease, stage 3 unspecified: Secondary | ICD-10-CM

## 2019-05-30 LAB — POCT GLYCOSYLATED HEMOGLOBIN (HGB A1C): Hemoglobin A1C: 5.1 % (ref 4.0–5.6)

## 2019-05-30 NOTE — Progress Notes (Signed)
Lucas Price is a 78 y.o. male is here for follow up.  History of Present Illness:   HPI:    1. Type 2 diabetes mellitus with stage 3 chronic kidney disease, with long-term current use of insulin, unspecified whether stage 3a or 3b CKD ([place]).  Lab Results  Component Value Date   HGBA1C 5.1 05/30/2019   Too tight control. Stop Amaryl. Discussed GLP1RA and patient agrees to start. Plan is to wean off Lantus if possible. 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 Samples provided for one month (0.75 mg x 2 weeks, then 1.5 mg after).   2. Restrictive lung disease. With previous work-up, low risk for malignancy. Patient uses 2LNC during day, CPAP at night. SOB a little worse lately. No CP or cough. Due for visit with Dr. Chase Caller.     3. Morbid obesity (Lucas Price).   Wt Readings from Last 3 Encounters:  05/30/19 169 lb (76.7 kg)  03/09/19 170 lb 11.2 oz (77.4 kg)  01/11/19 174 lb (78.9 kg)     4. Elevated WBC.  CBC Latest Ref Rng & Units 05/20/2019 01/11/2019 10/26/2018  WBC 4.0 - 10.5 K/uL 10.7(H) 10.7(H) 9.6  Hemoglobin 13.0 - 17.0 g/dL 12.5(L) 13.3 13.4  Hematocrit 39.0 - 52.0 % 38.2(L) 39.5 40.3  Platelets 150.0 - 400.0 K/uL 206.0 156.0 149.0(L)   Patient has been dealing with new diagnosis of Gingivitis. Followed by Dentist. Had gum biopsy for possible lichen planus last week. No results yet.    5. Anemia of chronic disease. Down 1 gram since last visit, but still very acceptable range. Visit with Renal last week and most recent labs reviewed by them. No concern. Patient denies any issues with bleeding. May be more hydrated at time of blood draw.    Health Maintenance Due  Topic Date Due  . FOOT EXAM  03/10/2019   Depression screen [facility] 2/9 05/30/2019 06/05/2018 04/29/2017  Decreased Interest 0 0 0  Down, Depressed, Hopeless 0 0 0  PHQ - 2 Score 0 0 0  Altered sleeping 0 - -  Tired, decreased energy 0 - -  Change in appetite 0 - -  Feeling bad or failure about yourself  0 - -    Trouble concentrating 0 - -  Moving slowly or fidgety/restless 0 - -  Suicidal thoughts 0 - -  PHQ-9 Score 0 - -  Difficult doing work/chores Not difficult at all - -   PMHx, SurgHx, SocialHx, FamHx, Medications, and Allergies were reviewed in the Visit Navigator and updated as appropriate.   Patient Active Problem List   Diagnosis Date Noted  . Ischemic heart disease 03/03/2019  . Coronary artery calcification seen on CAT scan 03/03/2019  . Morbid obesity (Lucas Price) 06/06/2018  . 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 Rhinitis, chronic 01/12/2018  . 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 Chronic respiratory failure with hypoxia (Lucas Price), on O2 at home, followed by Pulmonology 11/05/2017  . 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 Abnormal finding on GI tract imaging   . Benign neoplasm of ascending colon   . Pulmonary nodules/lesions, multiple 09/08/2017  . 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 Renal calculus 03/19/2015  . Anemia in chronic kidney disease (CKD) 05/26/2014  . CKD (chronic kidney disease) stage 3, GFR 30-59 ml/min (Lucas Price), followed by Renal 05/26/2014  . COPD (chronic obstructive pulmonary disease) (Lucas Price) 05/26/2014  . Insulin dependent diabetes with renal manifestation ([place]), on glimepiride, Lantus, Januvia 05/26/2014  . Former smoker 05/26/2014  . GERD (gastroesophageal  reflux disease) 05/26/2014  . 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 Bladder neck obstruction 04/24/2014  . Recurrent urinary tract infection 09/05/2013  . Retention of urine 09/05/2013   Social History   Tobacco Use  . Smoking status: Former Smoker    Packs/day: 0.10    Years: 2.00    Pack years: 0.20    Types: Cigarettes    Quit date: 12/31/2007    Years since quitting: 11.4  . Smokeless tobacco: Former Systems developer  . Tobacco comment: smokes 4-5 every other day  Substance Use Topics  . Alcohol use: No  .  Drug use: No   Current Medications and Allergies   Current Outpatient Medications:  .  allopurinol (ZYLOPRIM) 100 MG tablet, Take 1 tablet (100 mg total) by mouth daily., Disp: 90 tablet, Rfl: 1 .  aspirin EC 81 MG tablet, Take 81 mg by mouth daily. , Disp: , Rfl:  .  atorvastatin (LIPITOR) 10 MG tablet, TAKE 1 TABLET EVERY DAY, Disp: 90 tablet, Rfl: 0 .  budesonide-formoterol (SYMBICORT) 160-4.5 MCG/ACT inhaler, Inhale 2 puffs into the lungs 2 (two) times daily., Disp: 3 Inhaler, Rfl: 3 .  calcium carbonate (CALCIUM 600) 600 MG TABS tablet, Take 600 mg by mouth daily. , Disp: , Rfl:  .  Cholecalciferol (D 10000) 10000 units CAPS, Take 10,000 Units by mouth 2 (two) times a week. , Disp: , Rfl:  .  Coenzyme Q-10 200 MG CAPS, Take 200 mg by mouth daily. , Disp: , Rfl:  .  dorzolamide (TRUSOPT) 2 % ophthalmic solution, , Disp: , Rfl:  .  glimepiride (AMARYL) 4 MG tablet, TAKE 1 TABLET DAILY WITH BREAKFAST., Disp: 90 tablet, Rfl: 1 .  HYDROcodone-acetaminophen (NORCO) 10-325 MG tablet, Take 1 tablet by mouth every 8 (eight) hours as needed., Disp: 30 tablet, Rfl: 0 .  Insulin Syringe-Needle U-100 (BD INSULIN SYRINGE ULTRAFINE) 31G X 5/16" 0.5 ML MISC, 1 each by Does not apply route daily., Disp: 100 each, Rfl: 1 .  ketorolac (ACULAR) 0.5 % ophthalmic solution, , Disp: , Rfl:  .  LANTUS 100 UNIT/ML injection, INJECT  50 UNITS SUBCUTANEOUSLY AT BEDTIME, Disp: 50 mL, Rfl: 0 .  latanoprost (XALATAN) 0.005 % ophthalmic solution, , Disp: , Rfl:  .  metoprolol succinate (TOPROL-XL) 50 MG 24 hr tablet, TAKE 1 TABLET EVERY DAY, Disp: 90 tablet, Rfl: 0 .  OXYGEN, Inhale 1.5 L into the lungs., Disp: , Rfl:  .  prednisoLONE acetate (PRED FORTE) 1 % ophthalmic suspension, , Disp: , Rfl: 0 .  sitaGLIPtin (JANUVIA) 50 MG tablet, 1 tab daily, Disp: 90 tablet, Rfl: 3 .  SPIRIVA RESPIMAT 2.5 MCG/ACT AERS, INHALE 2 PUFFS INTO THE LUNGS DAILY., Disp: 12 g, Rfl: 3 .  Syringe, Disposable, 1 ML MISC, Use with insulin  needle., Disp: 100 each, Rfl: 3 .  Travoprost, BAK Free, (TRAVATAN Z) 0.004 % SOLN ophthalmic solution, Place 1 drop in both eyes once daily, Disp: 5 mL, Rfl: 3 .  triamcinolone (KENALOG) 0.1 % paste, Use as directed 1 application in the mouth or throat 2 (two) times daily., Disp: 5 g, Rfl: 12  No Known Allergies Review of Systems   Pertinent items are noted in the HPI. Otherwise, a complete ROS is negative.  Vitals   Vitals:   05/30/19 1339  BP: 110/60  Pulse: 80  Temp: 98.6 F (37 C)  TempSrc: Temporal  SpO2: 92%  Weight: 169 lb (76.7 kg)  Height: 5\' 3"  (1.6 m)     Body mass index is 29.94 kg/m.  Physical Exam   Physical Exam Vitals signs and nursing note reviewed.  Constitutional:      General: He is not in acute distress.    Appearance: He is well-developed. He is not diaphoretic.  HENT:     Head: Normocephalic and atraumatic.     Right Ear: External ear normal.     Left Ear: External ear normal.     Nose: Nose normal.     Mouth/Throat:     Mouth: Mucous membranes are moist.  Eyes:     Conjunctiva/sclera: Conjunctivae normal.     Pupils: Pupils are equal, round, and reactive to light.  Neck:     Musculoskeletal: Neck supple.  Cardiovascular:     Rate and Rhythm: Normal rate and regular rhythm.  Pulmonary:     Effort: Pulmonary effort is normal. No respiratory distress.     Breath sounds: Decreased breath sounds present.  Abdominal:     General: Bowel sounds are normal.     Palpations: Abdomen is soft.     Comments: Prune belly.   Musculoskeletal: Normal range of motion.  Skin:    General: Skin is warm.  Neurological:     Mental Status: He is alert.  Psychiatric:        Behavior: Behavior normal.    Results for orders placed or performed in visit on 05/30/19  POCT glycosylated hemoglobin (Hb A1C)  Result Value Ref Range   Hemoglobin A1C 5.1 4.0 - 5.6 %   HbA1c POC (<> result, manual entry)     HbA1c, POC (prediabetic range)     HbA1c, POC  (controlled diabetic range)      Assessment and Plan   F[place] was seen today for follow-up.  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 Orders and follow up as documented in [place], reviewed diet, exercise and weight control, cardiovascular risk and specific lipid/LDL goals reviewed, reviewed medications and side effects in detail.  . Reviewed expectations re: course of current medical issues. . Outlined signs and symptoms indicating need for more acute intervention. . Patient verbalized understanding and all questions were answered. . Patient received an After Visit Summary.  Briscoe Deutscher, DO [place] 05/30/2019

## 2019-05-30 NOTE — Telephone Encounter (Signed)
Contact to schedule TOC, email Lea to schedule appointment due to scheduling restrictions.

## 2019-05-30 NOTE — Telephone Encounter (Signed)
Dr Yong Channel has agreed to take patient and wife can you call to make appointment with them?

## 2019-05-31 NOTE — Telephone Encounter (Signed)
Patient is scheduled for December 14 waiting on lea to schedule

## 2019-05-31 NOTE — Telephone Encounter (Signed)
Patient called to schedule his new patient appt with Hunter [phone]

## 2019-05-31 NOTE — Telephone Encounter (Signed)
lvm for patient to call back and schedule a toc to Dr. Yong Channel

## 2019-05-31 NOTE — Telephone Encounter (Signed)
See note

## 2019-06-07 ENCOUNTER — Ambulatory Visit: Payer: PRIVATE HEALTH INSURANCE

## 2019-06-08 ENCOUNTER — Other Ambulatory Visit: Payer: Self-pay

## 2019-06-08 ENCOUNTER — Ambulatory Visit (INDEPENDENT_AMBULATORY_CARE_PROVIDER_SITE_OTHER): Payer: Medicare Other

## 2019-06-08 VITALS — BP 122/62 | Temp 97.7°F | Ht 63.0 in | Wt 169.2 lb

## 2019-06-08 DIAGNOSIS — Z0000 Encounter for general adult medical examination without abnormal findings: Secondary | ICD-10-CM

## 2019-06-08 NOTE — Patient Instructions (Signed)
Mr. Lucas Price , Thank you for taking time to come for your Medicare Wellness Visit. I appreciate your ongoing commitment to your health goals. Please review the following plan we discussed and let me know if I can assist you in the future.   Screening recommendations/referrals: Colorectal Screening: up to date; last 09/22/17 with Dr. Loletha Carrow   Vision and Dental Exams: Recommended annual ophthalmology exams for early detection of glaucoma and other disorders of the eye Recommended annual dental exams for proper oral hygiene  Diabetic Exams: Diabetic Eye Exam: recommended yearly Diabetic Foot Exam: at next office visit or with podiatry   Vaccinations: Influenza vaccine: completed 05/19/19 Pneumococcal vaccine: recommended Tdap vaccine: up to date; last 03/31/18 Shingles vaccine: Please call your insurance company to determine your out of pocket expense for the Shingrix vaccine. You may receive this vaccine at your local pharmacy.  Advanced directives: Advance directives discussed with you today. I have provided a copy for you to complete at home and have notarized. Once this is complete please bring a copy in to our office so we can scan it into your chart.  Goals: Recommend to drink at least 6-8 8oz glasses of water per day and consume a balanced diet rich in fresh fruits and vegetables.   Next appointment: Please schedule your Annual Wellness Visit with your Nurse Health Advisor in one year.  Preventive Care 11 Years and Older, Male Preventive care refers to lifestyle choices and visits with your health care provider that can promote health and wellness. What does preventive care include?  A yearly physical exam. This is also called an annual well check.  Dental exams once or twice a year.  Routine eye exams. Ask your health care provider how often you should have your eyes checked.  Personal lifestyle choices, including:  Daily care of your teeth and gums.  Regular physical  activity.  Eating a healthy diet.  Avoiding tobacco and drug use.  Limiting alcohol use.  Practicing safe sex.  Taking low doses of aspirin every day if recommended by your health care provider..  Taking vitamin and mineral supplements as recommended by your health care provider. What happens during an annual well check? The services and screenings done by your health care provider during your annual well check will depend on your age, overall health, lifestyle risk factors, and family history of disease. Counseling  Your health care provider may ask you questions about your:  Alcohol use.  Tobacco use.  Drug use.  Emotional well-being.  Home and relationship well-being.  Sexual activity.  Eating habits.  History of falls.  Memory and ability to understand (cognition).  Work and work Statistician. Screening  You may have the following tests or measurements:  Height, weight, and BMI.  Blood pressure.  Lipid and cholesterol levels. These may be checked every 5 years, or more frequently if you are over 44 years old.  Skin check.  Lung cancer screening. You may have this screening every year starting at age 40 if you have a 30-pack-year history of smoking and currently smoke or have quit within the past 15 years.  Fecal occult blood test (FOBT) of the stool. You may have this test every year starting at age 74.  Flexible sigmoidoscopy or colonoscopy. You may have a sigmoidoscopy every 5 years or a colonoscopy every 10 years starting at age 45.  Prostate cancer screening. Recommendations will vary depending on your family history and other risks.  Hepatitis C blood test.  Hepatitis B  blood test.  Sexually transmitted disease (STD) testing.  Diabetes screening. This is done by checking your blood sugar (glucose) after you have not eaten for a while (fasting). You may have this done every 1-3 years.  Abdominal aortic aneurysm (AAA) screening. You may need this  if you are a current or former smoker.  Osteoporosis. You may be screened starting at age 52 if you are at high risk. Talk with your health care provider about your test results, treatment options, and if necessary, the need for more tests. Vaccines  Your health care provider may recommend certain vaccines, such as:  Influenza vaccine. This is recommended every year.  Tetanus, diphtheria, and acellular pertussis (Tdap, Td) vaccine. You may need a Td booster every 10 years.  Zoster vaccine. You may need this after age 11.  Pneumococcal 13-valent conjugate (PCV13) vaccine. One dose is recommended after age 70.  Pneumococcal polysaccharide (PPSV23) vaccine. One dose is recommended after age 37. Talk to your health care provider about which screenings and vaccines you need and how often you need them. This information is not intended to replace advice given to you by your health care provider. Make sure you discuss any questions you have with your health care provider. Document Released: 08/24/2015 Document Revised: 04/16/2016 Document Reviewed: 05/29/2015 Elsevier Interactive Patient Education  2017 [employer] Prevention in the Home Falls can cause injuries. They can happen to people of all ages. There are many things you can do to make your home safe and to help prevent falls. What can I do on the outside of my home?  Regularly fix the edges of walkways and driveways and fix any cracks.  Remove anything that might make you trip as you walk through a door, such as a raised step or threshold.  Trim any bushes or trees on the path to your home.  Use bright outdoor lighting.  Clear any walking paths of anything that might make someone trip, such as rocks or tools.  Regularly check to see if handrails are loose or broken. Make sure that both sides of any steps have handrails.  Any raised decks and porches should have guardrails on the edges.  Have any leaves, snow, or ice  cleared regularly.  Use sand or salt on walking paths during winter.  Clean up any spills in your garage right away. This includes oil or grease spills. What can I do in the bathroom?  Use night lights.  Install grab bars by the toilet and in the tub and shower. Do not use towel bars as grab bars.  Use non-skid mats or decals in the tub or shower.  If you need to sit down in the shower, use a plastic, non-slip stool.  Keep the floor dry. Clean up any water that spills on the floor as soon as it happens.  Remove soap buildup in the tub or shower regularly.  Attach bath mats securely with double-sided non-slip rug tape.  Do not have throw rugs and other things on the floor that can make you trip. What can I do in the bedroom?  Use night lights.  Make sure that you have a light by your bed that is easy to reach.  Do not use any sheets or blankets that are too big for your bed. They should not hang down onto the floor.  Have a firm chair that has side arms. You can use this for support while you get dressed.  Do not have throw  rugs and other things on the floor that can make you trip. What can I do in the kitchen?  Clean up any spills right away.  Avoid walking on wet floors.  Keep items that you use a lot in easy-to-reach places.  If you need to reach something above you, use a strong step stool that has a grab bar.  Keep electrical cords out of the way.  Do not use floor polish or wax that makes floors slippery. If you must use wax, use non-skid floor wax.  Do not have throw rugs and other things on the floor that can make you trip. What can I do with my stairs?  Do not leave any items on the stairs.  Make sure that there are handrails on both sides of the stairs and use them. Fix handrails that are broken or loose. Make sure that handrails are as long as the stairways.  Check any carpeting to make sure that it is firmly attached to the stairs. Fix any carpet that  is loose or worn.  Avoid having throw rugs at the top or bottom of the stairs. If you do have throw rugs, attach them to the floor with carpet tape.  Make sure that you have a light switch at the top of the stairs and the bottom of the stairs. If you do not have them, ask someone to add them for you. What else can I do to help prevent falls?  Wear shoes that:  Do not have high heels.  Have rubber bottoms.  Are comfortable and fit you well.  Are closed at the toe. Do not wear sandals.  If you use a stepladder:  Make sure that it is fully opened. Do not climb a closed stepladder.  Make sure that both sides of the stepladder are locked into place.  Ask someone to hold it for you, if possible.  Clearly mark and make sure that you can see:  Any grab bars or handrails.  First and last steps.  Where the edge of each step is.  Use tools that help you move around (mobility aids) if they are needed. These include:  Canes.  Walkers.  Scooters.  Crutches.  Turn on the lights when you go into a dark area. Replace any light bulbs as soon as they burn out.  Set up your furniture so you have a clear path. Avoid moving your furniture around.  If any of your floors are uneven, fix them.  If there are any pets around you, be aware of where they are.  Review your medicines with your doctor. Some medicines can make you feel dizzy. This can increase your chance of falling. Ask your doctor what other things that you can do to help prevent falls. This information is not intended to replace advice given to you by your health care provider. Make sure you discuss any questions you have with your health care provider. Document Released: 05/24/2009 Document Revised: 01/03/2016 Document Reviewed: 09/01/2014 Elsevier Interactive Patient Education  2017 [employer].

## 2019-06-08 NOTE — Progress Notes (Signed)
Subjective:   Lucas Price is a 78 y.o. male who presents for Medicare Annual/Subsequent preventive examination.  Review of Systems:   Cardiac Risk Factors include: advanced age (>61men, >59 women);diabetes mellitus;male gender;sedentary lifestyle     Objective:    Vitals: BP 122/62   Temp 97.7 F (36.5 C)   Ht 5\' 3"  (1.6 m)   Wt 169 lb 3.2 oz (76.7 kg)   BMI 29.97 kg/m   Body mass index is 29.97 kg/m.  Advanced Directives 06/08/2019 10/07/2017 09/22/2017 04/29/2017  Does Patient Have a Medical Advance Directive? No No No No  Would patient like information on creating a medical advance directive? No - Patient declined No - Patient declined No - Patient declined Yes (MAU/Ambulatory/Procedural Areas - Information given)    Tobacco Social History   Tobacco Use  Smoking Status Former Smoker  . Packs/day: 0.10  . Years: 2.00  . Pack years: 0.20  . Types: Cigarettes  . Quit date: 12/31/2007  . Years since quitting: 11.4  Smokeless Tobacco Former Systems developer  Tobacco Comment   smokes 4-5 every other day     Counseling given: Not Answered Comment: smokes 4-5 every other day   Clinical Intake:  Pre-visit preparation completed: Yes  Pain : No/denies pain  Diabetes: Yes CBG done?: No Did pt. bring in CBG monitor from home?: No  How often do you need to have someone help you when you read instructions, pamphlets, or other written materials from your doctor or pharmacy?: 2 - Rarely  Interpreter Needed?: No  Information entered by :: Denman George LPN  Past Medical History:  Diagnosis Date  . Anemia 05/26/2014  . Bladder neck obstruction 04/24/2014  . Chronic respiratory failure with hypoxia ([place])   . CKD (chronic kidney disease) 05/26/2014  . COPD (chronic obstructive pulmonary disease) ([place]) 05/26/2014  . Coronary artery calcification seen on CAT scan   . Diabetes mellitus ([place]) 05/26/2014  . Former smoker 05/26/2014  . GERD (gastroesophageal reflux disease)  05/26/2014  . Glaucoma   . Heart murmur    mild  . HTN (hypertension) 05/26/2014  . Ischemic heart disease    Lexiscan myoview stress test 02/08/2018 - no stress sx reported.  . Lung nodules   . 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 Recurrent urinary tract infection 09/05/2013  . Renal calculus 03/19/2015  . Retention of urine 09/05/2013  . Sleep apnea    wears Bipap  . SOB (shortness of breath)    Past Surgical History:  Procedure Laterality Date  . BLADDER NECK RECONSTRUCTION    . CATARACT EXTRACTION, BILATERAL    . COLONOSCOPY WITH PROPOFOL N/A 09/22/2017   Procedure: COLONOSCOPY WITH PROPOFOL;  Surgeon: Doran Stabler, MD;  Location: [facility];  Service: Gastroenterology;  Laterality: N/A;  . HERNIA REPAIR Bilateral    Inguinal  . VIDEO BRONCHOSCOPY WITH ENDOBRONCHIAL NAVIGATION N/A 10/07/2017   Procedure: VIDEO BRONCHOSCOPY WITH ENDOBRONCHIAL NAVIGATION;  Surgeon: Collene Gobble, MD;  Location: [facility] OR;  Service: Thoracic;  Laterality: N/A;  . VIDEO BRONCHOSCOPY WITH ENDOBRONCHIAL ULTRASOUND N/A 10/07/2017   Procedure: VIDEO BRONCHOSCOPY WITH ENDOBRONCHIAL ULTRASOUND;  Surgeon: Collene Gobble, MD;  Location: [facility] OR;  Service: Thoracic;  Laterality: N/A;   Family History  Problem Relation Age of Onset  . Heart disease Mother   . Diabetes Mother    Social History   Socioeconomic History  . Marital status: Married    Spouse name: Not on file  .  Number of children: Not on file  . Years of education: Not on file  . Highest education level: Not on file  Occupational History  . Not on file  Social Needs  . Financial resource strain: Not on file  . Food insecurity    Worry: Not on file    Inability: Not on file  . Transportation needs    Medical: Not on file    Non-medical: Not on file  Tobacco Use  . Smoking status: Former Smoker    Packs/day: 0.10    Years: 2.00    Pack years: 0.20    Types: Cigarettes    Quit date: 12/31/2007     Years since quitting: 11.4  . Smokeless tobacco: Former Systems developer  . Tobacco comment: smokes 4-5 every other day  Substance and Sexual Activity  . Alcohol use: No  . Drug use: No  . Sexual activity: Yes    Partners: Female  Lifestyle  . Physical activity    Days per week: Not on file    Minutes per session: Not on file  . Stress: Not on file  Relationships  . Social Herbalist on phone: Not on file    Gets together: Not on file    Attends religious service: Not on file    Active member of club or organization: Not on file    Attends meetings of clubs or organizations: Not on file    Relationship status: Not on file  Other Topics Concern  . Not on file  Social History Narrative   Still actively driving without problems     Outpatient Encounter Medications as of 06/08/2019  Medication Sig  . allopurinol (ZYLOPRIM) 100 MG tablet Take 1 tablet (100 mg total) by mouth daily.  [name] aspirin EC 81 MG tablet Take 81 mg by mouth daily.   [name] atorvastatin (LIPITOR) 10 MG tablet TAKE 1 TABLET EVERY DAY  . budesonide-formoterol (SYMBICORT) 160-4.5 MCG/ACT inhaler Inhale 2 puffs into the lungs 2 (two) times daily.  . calcium carbonate (CALCIUM 600) 600 MG TABS tablet Take 600 mg by mouth daily.   . Cholecalciferol (D 10000) 10000 units CAPS Take 10,000 Units by mouth 2 (two) times a week.   . Coenzyme Q-10 200 MG CAPS Take 200 mg by mouth daily.   . dorzolamide (TRUSOPT) 2 % ophthalmic solution   . HYDROcodone-acetaminophen (NORCO) 10-325 MG tablet Take 1 tablet by mouth every 8 (eight) hours as needed.  . Insulin Syringe-Needle U-100 (BD INSULIN SYRINGE ULTRAFINE) 31G X 5/16" 0.5 ML MISC 1 each by Does not apply route daily.  [name] ketorolac (ACULAR) 0.5 % ophthalmic solution   . LANTUS 100 UNIT/ML injection INJECT  50 UNITS SUBCUTANEOUSLY AT BEDTIME  . latanoprost (XALATAN) 0.005 % ophthalmic solution   . metoprolol succinate (TOPROL-XL) 50 MG 24 hr tablet TAKE 1 TABLET EVERY DAY  . OXYGEN  Inhale 1.5 L into the lungs.  . prednisoLONE acetate (PRED FORTE) 1 % ophthalmic suspension   . sitaGLIPtin (JANUVIA) 50 MG tablet 1 tab daily  . SPIRIVA RESPIMAT 2.5 MCG/ACT AERS INHALE 2 PUFFS INTO THE LUNGS DAILY.  [name] Syringe, Disposable, 1 ML MISC Use with insulin needle.  . Travoprost, BAK Free, (TRAVATAN Z) 0.004 % SOLN ophthalmic solution Place 1 drop in both eyes once daily  . triamcinolone (KENALOG) 0.1 % paste Use as directed 1 application in the mouth or throat 2 (two) times daily.   No facility-administered encounter medications on file as of  06/08/2019.     Activities of Daily Living In your present state of health, do you have any difficulty performing the following activities: 06/08/2019  Hearing? N  Vision? Y  Comment glaucoma  Difficulty concentrating or making decisions? N  Walking or climbing stairs? Y  Dressing or bathing? N  Doing errands, shopping? Y  Comment at times  Preparing Food and eating ? N  Using the Toilet? N  In the past six months, have you accidently leaked urine? N  Do you have problems with loss of bowel control? N  Managing your Medications? N  Managing your Finances? N  Housekeeping or managing your Housekeeping? Y  Some recent data might be hidden    Patient Care Team: Briscoe Deutscher, DO as PCP - General (Family Medicine) Zadie Rhine Clent Demark, MD as Consulting Physician (Ophthalmology) Elmarie Shiley, MD as Consulting Physician (Nephrology) Burman Foster, MD as Physician Assistant (Urology) Despina Hick, MD as Referring Physician (Cardiology) Gardiner Barefoot, DPM as Consulting Physician (Podiatry)   Assessment:   This is a routine wellness examination for Czar.  Exercise Activities and Dietary recommendations Current Exercise Habits: The patient does not participate in regular exercise at present  Goals    . Get better sleep.       Fall Risk Fall Risk  06/25/2018 06/05/2018 04/29/2017  Falls in the past year? 0 No Yes  Comment Emmi  Telephone Survey: data to providers prior to load - -  Number falls in past yr: - - 1  Injury with Fall? - - No  Follow up - - Education provided;Falls prevention discussed   Is the patient's home free of loose throw rugs in walkways, pet beds, electrical cords, etc?   yes      Grab bars in the bathroom? yes      Handrails on the stairs?   yes      Adequate lighting?   yes  Timed Get Up and Go Performed: completed and within normal timeframe; no gait abnormalities noted    Depression Screen PHQ 2/9 Scores 05/30/2019 06/05/2018 04/29/2017  PHQ - 2 Score 0 0 0  PHQ- 9 Score 0 - -    Cognitive Function-no cognitive concerns at this time    6CIT Screen 06/08/2019  What Year? 0 points  What month? 0 points  What time? 0 points  Count back from 20 0 points  Months in reverse 0 points  Repeat phrase 0 points  Total Score 0    Immunization History  Administered Date(s) Administered  . Fluad Quad(high Dose 65+) 05/19/2019  . Influenza, High Dose Seasonal PF 04/29/2017, 05/04/2018  . Tdap 03/31/2018    Qualifies for Shingles Vaccine? Discussed and patient will check with pharmacy for coverage.  Patient education handout provided   Screening Tests Health Maintenance  Topic Date Due  . FOOT EXAM  03/10/2019  . OPHTHALMOLOGY EXAM  09/23/2019  . URINE MICROALBUMIN  10/26/2019  . HEMOGLOBIN A1C  11/28/2019  . INFLUENZA VACCINE  Completed   Cancer Screenings: Lung: Low Dose CT Chest recommended if Age 44-80 years, 30 pack-year currently smoking OR have quit w/in 15years. Patient does not qualify. Colorectal: colonoscopy 09/22/17 with Dr. Loletha Carrow    Plan:  I have personally reviewed and addressed the Medicare Annual Wellness questionnaire and have noted the following in the patient's chart:  A. Medical and social history B. Use of alcohol, tobacco or illicit drugs  C. Current medications and supplements D. Functional ability and status E.  Nutritional  status F.  Physical  activity G. Advance directives H. List of other physicians I.  Hospitalizations, surgeries, and ER visits in previous 12 months J.  [place] such as hearing and vision if needed, cognitive and depression L. Referrals, records requested, and appointments-  None   In addition, I have reviewed and discussed with patient certain preventive protocols, quality metrics, and best practice recommendations. A written personalized care plan for preventive services as well as general preventive health recommendations were provided to patient.   Signed,  Denman George, LPN  Nurse Health Advisor   Nurse Notes: no additional

## 2019-06-09 NOTE — Progress Notes (Signed)
I have reviewed and agree with note, evaluation, plan.   Stephen Hunter, MD  

## 2019-06-10 ENCOUNTER — Other Ambulatory Visit: Payer: Self-pay | Admitting: Family Medicine

## 2019-06-10 DIAGNOSIS — N183 Chronic kidney disease, stage 3 unspecified: Secondary | ICD-10-CM

## 2019-06-10 DIAGNOSIS — E1122 Type 2 diabetes mellitus with diabetic chronic kidney disease: Secondary | ICD-10-CM

## 2019-06-13 DIAGNOSIS — H04123 Dry eye syndrome of bilateral lacrimal glands: Secondary | ICD-10-CM | POA: Diagnosis not present

## 2019-06-13 DIAGNOSIS — H401134 Primary open-angle glaucoma, bilateral, indeterminate stage: Secondary | ICD-10-CM | POA: Diagnosis not present

## 2019-06-13 NOTE — Telephone Encounter (Signed)
Has TOC with you ok to refill?

## 2019-06-28 DIAGNOSIS — K1239 Other oral mucositis (ulcerative): Secondary | ICD-10-CM | POA: Diagnosis not present

## 2019-07-06 ENCOUNTER — Other Ambulatory Visit: Payer: Self-pay

## 2019-07-08 ENCOUNTER — Other Ambulatory Visit: Payer: Self-pay | Admitting: Family Medicine

## 2019-07-12 ENCOUNTER — Encounter (HOSPITAL_COMMUNITY): Payer: Self-pay

## 2019-07-12 ENCOUNTER — Inpatient Hospital Stay (HOSPITAL_COMMUNITY)
Admission: EM | Admit: 2019-07-12 | Discharge: 2019-08-12 | DRG: 028 | Disposition: E | Payer: Medicare Other | Attending: Neurosurgery | Admitting: Neurosurgery

## 2019-07-12 ENCOUNTER — Emergency Department (HOSPITAL_COMMUNITY): Payer: Medicare Other

## 2019-07-12 ENCOUNTER — Other Ambulatory Visit: Payer: Self-pay

## 2019-07-12 DIAGNOSIS — Z95828 Presence of other vascular implants and grafts: Secondary | ICD-10-CM

## 2019-07-12 DIAGNOSIS — S32019A Unspecified fracture of first lumbar vertebra, initial encounter for closed fracture: Secondary | ICD-10-CM

## 2019-07-12 DIAGNOSIS — H4010X0 Unspecified open-angle glaucoma, stage unspecified: Secondary | ICD-10-CM | POA: Diagnosis present

## 2019-07-12 DIAGNOSIS — Z6837 Body mass index (BMI) 37.0-37.9, adult: Secondary | ICD-10-CM

## 2019-07-12 DIAGNOSIS — J9601 Acute respiratory failure with hypoxia: Secondary | ICD-10-CM

## 2019-07-12 DIAGNOSIS — R140 Abdominal distension (gaseous): Secondary | ICD-10-CM

## 2019-07-12 DIAGNOSIS — S22088A Other fracture of T11-T12 vertebra, initial encounter for closed fracture: Secondary | ICD-10-CM | POA: Diagnosis present

## 2019-07-12 DIAGNOSIS — L8990 Pressure ulcer of unspecified site, unspecified stage: Secondary | ICD-10-CM | POA: Insufficient documentation

## 2019-07-12 DIAGNOSIS — R570 Cardiogenic shock: Secondary | ICD-10-CM | POA: Diagnosis not present

## 2019-07-12 DIAGNOSIS — Z419 Encounter for procedure for purposes other than remedying health state, unspecified: Secondary | ICD-10-CM

## 2019-07-12 DIAGNOSIS — Z452 Encounter for adjustment and management of vascular access device: Secondary | ICD-10-CM

## 2019-07-12 DIAGNOSIS — S32018A Other fracture of first lumbar vertebra, initial encounter for closed fracture: Secondary | ICD-10-CM | POA: Diagnosis present

## 2019-07-12 DIAGNOSIS — Z981 Arthrodesis status: Secondary | ICD-10-CM

## 2019-07-12 DIAGNOSIS — S22089A Unspecified fracture of T11-T12 vertebra, initial encounter for closed fracture: Secondary | ICD-10-CM

## 2019-07-12 DIAGNOSIS — J439 Emphysema, unspecified: Secondary | ICD-10-CM | POA: Diagnosis present

## 2019-07-12 DIAGNOSIS — Z794 Long term (current) use of insulin: Secondary | ICD-10-CM

## 2019-07-12 DIAGNOSIS — Z7982 Long term (current) use of aspirin: Secondary | ICD-10-CM

## 2019-07-12 DIAGNOSIS — W19XXXA Unspecified fall, initial encounter: Secondary | ICD-10-CM | POA: Diagnosis not present

## 2019-07-12 DIAGNOSIS — R1310 Dysphagia, unspecified: Secondary | ICD-10-CM | POA: Diagnosis not present

## 2019-07-12 DIAGNOSIS — E872 Acidosis: Secondary | ICD-10-CM | POA: Diagnosis not present

## 2019-07-12 DIAGNOSIS — S14129A Central cord syndrome at unspecified level of cervical spinal cord, initial encounter: Secondary | ICD-10-CM | POA: Diagnosis not present

## 2019-07-12 DIAGNOSIS — Q069 Congenital malformation of spinal cord, unspecified: Secondary | ICD-10-CM

## 2019-07-12 DIAGNOSIS — R531 Weakness: Secondary | ICD-10-CM | POA: Diagnosis not present

## 2019-07-12 DIAGNOSIS — I2510 Atherosclerotic heart disease of native coronary artery without angina pectoris: Secondary | ICD-10-CM | POA: Diagnosis present

## 2019-07-12 DIAGNOSIS — R6521 Severe sepsis with septic shock: Secondary | ICD-10-CM | POA: Diagnosis not present

## 2019-07-12 DIAGNOSIS — Z833 Family history of diabetes mellitus: Secondary | ICD-10-CM

## 2019-07-12 DIAGNOSIS — E1151 Type 2 diabetes mellitus with diabetic peripheral angiopathy without gangrene: Secondary | ICD-10-CM | POA: Diagnosis present

## 2019-07-12 DIAGNOSIS — R0902 Hypoxemia: Secondary | ICD-10-CM | POA: Diagnosis not present

## 2019-07-12 DIAGNOSIS — Z9981 Dependence on supplemental oxygen: Secondary | ICD-10-CM

## 2019-07-12 DIAGNOSIS — G4733 Obstructive sleep apnea (adult) (pediatric): Secondary | ICD-10-CM | POA: Diagnosis present

## 2019-07-12 DIAGNOSIS — J9621 Acute and chronic respiratory failure with hypoxia: Secondary | ICD-10-CM | POA: Diagnosis present

## 2019-07-12 DIAGNOSIS — M109 Gout, unspecified: Secondary | ICD-10-CM | POA: Diagnosis present

## 2019-07-12 DIAGNOSIS — I480 Paroxysmal atrial fibrillation: Secondary | ICD-10-CM | POA: Diagnosis present

## 2019-07-12 DIAGNOSIS — Z7951 Long term (current) use of inhaled steroids: Secondary | ICD-10-CM

## 2019-07-12 DIAGNOSIS — N186 End stage renal disease: Secondary | ICD-10-CM | POA: Diagnosis not present

## 2019-07-12 DIAGNOSIS — I50810 Right heart failure, unspecified: Secondary | ICD-10-CM | POA: Diagnosis not present

## 2019-07-12 DIAGNOSIS — W108XXA Fall (on) (from) other stairs and steps, initial encounter: Secondary | ICD-10-CM | POA: Diagnosis present

## 2019-07-12 DIAGNOSIS — M6281 Muscle weakness (generalized): Secondary | ICD-10-CM | POA: Diagnosis not present

## 2019-07-12 DIAGNOSIS — Z978 Presence of other specified devices: Secondary | ICD-10-CM

## 2019-07-12 DIAGNOSIS — E1165 Type 2 diabetes mellitus with hyperglycemia: Secondary | ICD-10-CM | POA: Diagnosis present

## 2019-07-12 DIAGNOSIS — A419 Sepsis, unspecified organism: Secondary | ICD-10-CM | POA: Diagnosis not present

## 2019-07-12 DIAGNOSIS — G8250 Quadriplegia, unspecified: Secondary | ICD-10-CM | POA: Diagnosis present

## 2019-07-12 DIAGNOSIS — S14125A Central cord syndrome at C5 level of cervical spinal cord, initial encounter: Principal | ICD-10-CM | POA: Diagnosis present

## 2019-07-12 DIAGNOSIS — E669 Obesity, unspecified: Secondary | ICD-10-CM | POA: Diagnosis present

## 2019-07-12 DIAGNOSIS — M2578 Osteophyte, vertebrae: Secondary | ICD-10-CM | POA: Diagnosis present

## 2019-07-12 DIAGNOSIS — I2781 Cor pulmonale (chronic): Secondary | ICD-10-CM | POA: Diagnosis present

## 2019-07-12 DIAGNOSIS — E870 Hyperosmolality and hypernatremia: Secondary | ICD-10-CM | POA: Diagnosis not present

## 2019-07-12 DIAGNOSIS — Z4659 Encounter for fitting and adjustment of other gastrointestinal appliance and device: Secondary | ICD-10-CM

## 2019-07-12 DIAGNOSIS — J9811 Atelectasis: Secondary | ICD-10-CM

## 2019-07-12 DIAGNOSIS — R0989 Other specified symptoms and signs involving the circulatory and respiratory systems: Secondary | ICD-10-CM

## 2019-07-12 DIAGNOSIS — E1122 Type 2 diabetes mellitus with diabetic chronic kidney disease: Secondary | ICD-10-CM | POA: Diagnosis present

## 2019-07-12 DIAGNOSIS — J189 Pneumonia, unspecified organism: Secondary | ICD-10-CM | POA: Diagnosis present

## 2019-07-12 DIAGNOSIS — Z8249 Family history of ischemic heart disease and other diseases of the circulatory system: Secondary | ICD-10-CM

## 2019-07-12 DIAGNOSIS — I9581 Postprocedural hypotension: Secondary | ICD-10-CM | POA: Diagnosis not present

## 2019-07-12 DIAGNOSIS — Z66 Do not resuscitate: Secondary | ICD-10-CM | POA: Diagnosis not present

## 2019-07-12 DIAGNOSIS — Z87440 Personal history of urinary (tract) infections: Secondary | ICD-10-CM

## 2019-07-12 DIAGNOSIS — E878 Other disorders of electrolyte and fluid balance, not elsewhere classified: Secondary | ICD-10-CM | POA: Diagnosis present

## 2019-07-12 DIAGNOSIS — J449 Chronic obstructive pulmonary disease, unspecified: Secondary | ICD-10-CM | POA: Diagnosis present

## 2019-07-12 DIAGNOSIS — G8929 Other chronic pain: Secondary | ICD-10-CM | POA: Diagnosis present

## 2019-07-12 DIAGNOSIS — K5900 Constipation, unspecified: Secondary | ICD-10-CM

## 2019-07-12 DIAGNOSIS — Z87891 Personal history of nicotine dependence: Secondary | ICD-10-CM

## 2019-07-12 DIAGNOSIS — R202 Paresthesia of skin: Secondary | ICD-10-CM | POA: Diagnosis not present

## 2019-07-12 DIAGNOSIS — E875 Hyperkalemia: Secondary | ICD-10-CM | POA: Diagnosis not present

## 2019-07-12 DIAGNOSIS — N170 Acute kidney failure with tubular necrosis: Secondary | ICD-10-CM | POA: Diagnosis not present

## 2019-07-12 DIAGNOSIS — Z20828 Contact with and (suspected) exposure to other viral communicable diseases: Secondary | ICD-10-CM | POA: Diagnosis present

## 2019-07-12 DIAGNOSIS — I132 Hypertensive heart and chronic kidney disease with heart failure and with stage 5 chronic kidney disease, or end stage renal disease: Secondary | ICD-10-CM | POA: Diagnosis present

## 2019-07-12 DIAGNOSIS — Z79899 Other long term (current) drug therapy: Secondary | ICD-10-CM

## 2019-07-12 DIAGNOSIS — R8281 Pyuria: Secondary | ICD-10-CM | POA: Diagnosis not present

## 2019-07-12 DIAGNOSIS — G9340 Encephalopathy, unspecified: Secondary | ICD-10-CM | POA: Diagnosis not present

## 2019-07-12 DIAGNOSIS — J9600 Acute respiratory failure, unspecified whether with hypoxia or hypercapnia: Secondary | ICD-10-CM

## 2019-07-12 DIAGNOSIS — N183 Chronic kidney disease, stage 3 unspecified: Secondary | ICD-10-CM | POA: Diagnosis present

## 2019-07-12 LAB — CBC WITH DIFFERENTIAL/PLATELET
Abs Immature Granulocytes: 0.17 10*3/uL — ABNORMAL HIGH (ref 0.00–0.07)
Basophils Absolute: 0 10*3/uL (ref 0.0–0.1)
Basophils Relative: 0 %
Eosinophils Absolute: 0 10*3/uL (ref 0.0–0.5)
Eosinophils Relative: 0 %
HCT: 40.4 % (ref 39.0–52.0)
Hemoglobin: 12.9 g/dL — ABNORMAL LOW (ref 13.0–17.0)
Immature Granulocytes: 1 %
Lymphocytes Relative: 6 %
Lymphs Abs: 1.2 10*3/uL (ref 0.7–4.0)
MCH: 31.4 pg (ref 26.0–34.0)
MCHC: 31.9 g/dL (ref 30.0–36.0)
MCV: 98.3 fL (ref 80.0–100.0)
Monocytes Absolute: 1 10*3/uL (ref 0.1–1.0)
Monocytes Relative: 5 %
Neutro Abs: 17.9 10*3/uL — ABNORMAL HIGH (ref 1.7–7.7)
Neutrophils Relative %: 88 %
Platelets: 152 10*3/uL (ref 150–400)
RBC: 4.11 MIL/uL — ABNORMAL LOW (ref 4.22–5.81)
RDW: 14.4 % (ref 11.5–15.5)
WBC: 20.3 10*3/uL — ABNORMAL HIGH (ref 4.0–10.5)
nRBC: 0 % (ref 0.0–0.2)

## 2019-07-12 LAB — COMPREHENSIVE METABOLIC PANEL
ALT: 40 U/L (ref 0–44)
AST: 47 U/L — ABNORMAL HIGH (ref 15–41)
Albumin: 3.1 g/dL — ABNORMAL LOW (ref 3.5–5.0)
Alkaline Phosphatase: 73 U/L (ref 38–126)
Anion gap: 12 (ref 5–15)
BUN: 51 mg/dL — ABNORMAL HIGH (ref 8–23)
CO2: 18 mmol/L — ABNORMAL LOW (ref 22–32)
Calcium: 8.8 mg/dL — ABNORMAL LOW (ref 8.9–10.3)
Chloride: 105 mmol/L (ref 98–111)
Creatinine, Ser: 1.82 mg/dL — ABNORMAL HIGH (ref 0.61–1.24)
GFR calc Af Amer: 41 mL/min — ABNORMAL LOW (ref >60)
GFR calc non Af Amer: 35 mL/min — ABNORMAL LOW (ref >60)
Glucose, Bld: 161 mg/dL — ABNORMAL HIGH (ref 70–99)
Potassium: 4.6 mmol/L (ref 3.5–5.1)
Sodium: 135 mmol/L (ref 135–145)
Total Bilirubin: 0.9 mg/dL (ref 0.3–1.2)
Total Protein: 7.1 g/dL (ref 6.5–8.1)

## 2019-07-12 MED ORDER — HYDROMORPHONE HCL 1 MG/ML IJ SOLN
1.0000 mg | Freq: Once | INTRAMUSCULAR | Status: AC
  Administered 2019-07-12: 1 mg via INTRAVENOUS
  Filled 2019-07-12: qty 1

## 2019-07-12 MED ORDER — SODIUM CHLORIDE 0.9 % IV BOLUS (SEPSIS)
1000.0000 mL | Freq: Once | INTRAVENOUS | Status: AC
  Administered 2019-07-12: 1000 mL via INTRAVENOUS

## 2019-07-12 MED ORDER — HYDROMORPHONE HCL 1 MG/ML IJ SOLN
0.5000 mg | Freq: Once | INTRAMUSCULAR | Status: AC
  Administered 2019-07-12: 0.5 mg via INTRAVENOUS
  Filled 2019-07-12: qty 1

## 2019-07-12 NOTE — ED Triage Notes (Signed)
Pt began having left leg numbness/weakness around 1711, went to step up on stairs and fell backward on rug, hit head-small lac noted to top of head, reports he now has bilateral numbness and weakness to both legs and arms. States he can not move his legs. A&O x 4, No LOC, no blood thinners.

## 2019-07-12 NOTE — ED Notes (Signed)
Patient transported to CT 

## 2019-07-12 NOTE — ED Notes (Signed)
Pt has 2+ pedal pulses bilat, cap refill less than 3 sec. Pt able to wiggle toes bilat. Pt able to move upper arms. Pt states has numbness in legs bilat and that is baseline for him.

## 2019-07-12 NOTE — H&P (Signed)
History and Physical    Lucas Price DXI:[id number] DOB: 1941-02-16 DOA: 08/03/2019  PCP: Briscoe Deutscher, DO Patient coming from: Home  Chief Complaint: Weakness  HPI: Lucas Price is a 78 y.o. male with medical history significant of CAD, COPD, CAD, diabetes mellitus, GERD, hypertension, recurrent UTIs, sleep apnea on BiPAP, gout presenting with complaints of weakness and a fall.  Patient states last night while climbing stairs his legs gave out and he fell and hit his head.  He is complaining of severe pain in his bilateral upper extremities.  Reports having chronic lower back pain secondary to degenerative disc disease.  Denies saddle anesthesia, urinary or fecal incontinence.  Reports having dyspnea even with minimal exertion which he feels is chronic and not significantly changed from his baseline.  He has COPD and uses home oxygen only as needed.  He is a retired Film/video editor.  Denies any exertional angina.  States he had a stress test done previously which was abnormal but he decided not to have cardiac catheterization done due to renal insufficiency.  Denies fevers, cough, shortness of breath, or recent sick contacts.    ED Course: Afebrile.  Oxygen saturation in the 80s, placed on supplemental oxygen.  Blood pressure soft and improved after 1 L normal saline bolus.  WBC count 20.3 with left shift.  Bicarb 18, anion gap 12.  Blood glucose 161.  BUN 51, creatinine 1.8.  Creatinine ranging from 1.8-2.0 in the past 8 months.  Hemoglobin stable at 12.9 compared to labs done a month ago.  UA pending.  SARS-CoV-2 test pending. Head CT showing mild posterior vertex scalp soft tissue injury without underlying fracture.  No acute traumatic injury to the brain. CT C-spine negative for fracture but with mild prevertebral fluid raising the possibility of anterior ligamentous injury at C3.  C-spine MRI without contrast recommended for further evaluation.  Widespread and up to severe degenerative cervical  spinal stenosis.  Nonspecific pulmonary groundglass opacity in the right upper lobe. Patient received Dilaudid.  Review of Systems:  All systems reviewed and apart from history of presenting illness, are negative.  Past Medical History:  Diagnosis Date   Anemia 05/26/2014   Bladder neck obstruction 04/24/2014   Chronic respiratory failure with hypoxia (HCC)    CKD (chronic kidney disease) 05/26/2014   COPD (chronic obstructive pulmonary disease) ([place]) 05/26/2014   Coronary artery calcification seen on CAT scan    Diabetes mellitus ([place]) 05/26/2014   Former smoker 05/26/2014   GERD (gastroesophageal reflux disease) 05/26/2014   Glaucoma    Heart murmur    mild   HTN (hypertension) 05/26/2014   Ischemic heart disease    Lexiscan myoview stress test 02/08/2018 - no stress sx reported.   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   Recurrent urinary tract infection 09/05/2013   Renal calculus 03/19/2015   Retention of urine 09/05/2013   Sleep apnea    wears Bipap   SOB (shortness of breath)     Past Surgical History:  Procedure Laterality Date   BLADDER NECK RECONSTRUCTION     CATARACT EXTRACTION, BILATERAL     COLONOSCOPY WITH PROPOFOL N/A 09/22/2017   Procedure: COLONOSCOPY WITH PROPOFOL;  Surgeon: Doran Stabler, MD;  Location: [facility];  Service: Gastroenterology;  Laterality: N/A;   HERNIA REPAIR Bilateral    Inguinal   VIDEO BRONCHOSCOPY WITH ENDOBRONCHIAL NAVIGATION N/A 10/07/2017   Procedure: VIDEO BRONCHOSCOPY WITH ENDOBRONCHIAL NAVIGATION;  Surgeon: Lamonte Sakai,  Rose Fillers, MD;  Location: [facility] OR;  Service: Thoracic;  Laterality: N/A;   VIDEO BRONCHOSCOPY WITH ENDOBRONCHIAL ULTRASOUND N/A 10/07/2017   Procedure: VIDEO BRONCHOSCOPY WITH ENDOBRONCHIAL ULTRASOUND;  Surgeon: Collene Gobble, MD;  Location: [facility];  Service: Thoracic;  Laterality: N/A;     reports that he quit smoking about 11 years ago. His smoking  use included cigarettes. He has a 0.20 pack-year smoking history. He has quit using smokeless tobacco. He reports that he does not drink alcohol or use drugs.  No Known Allergies  Family History  Problem Relation Age of Onset   Heart disease Mother    Diabetes Mother     Prior to Admission medications   Medication Sig Start Date End Date Taking? Authorizing Provider  allopurinol (ZYLOPRIM) 100 MG tablet Take 1 tablet (100 mg total) by mouth daily. 01/14/19  Yes Briscoe Deutscher, DO  aspirin EC 81 MG tablet Take 81 mg by mouth daily.    Yes [provider]  atorvastatin (LIPITOR) 10 MG tablet TAKE 1 TABLET EVERY DAY Patient taking differently: Take 10 mg by mouth every morning.  05/27/19  Yes Miquel Dunn, NP  budesonide-formoterol [facility]) 160-4.5 MCG/ACT inhaler Inhale 2 puffs into the lungs 2 (two) times daily. Patient taking differently: Inhale 2 puffs into the lungs every morning.  04/01/18  Yes Brand Males, MD  calcium carbonate (CALCIUM 600) 600 MG TABS tablet Take 600 mg by mouth daily.    Yes [provider]  Cholecalciferol (D 10000) 10000 units CAPS Take 10,000 Units by mouth every other day.    Yes [provider]  Coenzyme Q-10 200 MG CAPS Take 200 mg by mouth daily.    Yes [provider]  dorzolamide (TRUSOPT) 2 % ophthalmic solution Place 1 drop into both eyes every morning.  10/29/17  Yes [provider]  Insulin Syringe-Needle U-100 (BD INSULIN SYRINGE ULTRAFINE) 31G X 5/16" 0.5 ML MISC 1 each by Does not apply route daily. 08/23/18  Yes Briscoe Deutscher, DO  ketorolac (ACULAR) 0.5 % ophthalmic solution Place 1 drop into both eyes 3 (three) times daily.  06/02/18  Yes [provider]  LANTUS 100 UNIT/ML injection INJECT  50 UNITS SUBCUTANEOUSLY AT BEDTIME Patient taking differently: Inject 20 Units into the skin at bedtime.  07/10/19  Yes Leamon Arnt, MD  latanoprost (XALATAN) 0.005 % ophthalmic solution  Place 1 drop into both eyes at bedtime.  05/11/19  Yes [provider]  metoprolol succinate (TOPROL-XL) 50 MG 24 hr tablet TAKE 1 TABLET EVERY DAY Patient taking differently: Take 50 mg by mouth daily.  06/13/19  Yes Marin Olp, MD  OXYGEN Inhale 1.5 L into the lungs continuous.    Yes [provider]  sitaGLIPtin (JANUVIA) 50 MG tablet 1 tab daily Patient taking differently: Take 50 mg by mouth daily.  01/14/19  Yes Briscoe Deutscher, DO  SPIRIVA RESPIMAT 2.5 MCG/ACT AERS INHALE 2 PUFFS INTO THE LUNGS DAILY. Patient taking differently: Inhale 2 puffs into the lungs every morning.  04/05/18  Yes Marin Olp, MD  Syringe, Disposable, 1 ML MISC Use with insulin needle. 02/19/17  Yes Briscoe Deutscher, DO  Travoprost, BAK Free, (TRAVATAN Z) 0.004 % SOLN ophthalmic solution Place 1 drop in both eyes once daily 05/13/19  Yes Briscoe Deutscher, DO  triamcinolone (KENALOG) 0.1 % paste Use as directed 1 application in the mouth or throat 2 (two) times daily. Patient taking differently: Use as directed 1 application in the  mouth or throat 2 (two) times daily as needed (skin care).  01/11/19  Yes Briscoe Deutscher, DO  HYDROcodone-acetaminophen (NORCO) 10-325 MG tablet Take 1 tablet by mouth every 8 (eight) hours as needed. Patient not taking: Reported on 08/05/2019 11/10/18   Briscoe Deutscher, DO    Physical Exam: Vitals:   08/11/2019 0000 08/04/2019 0030 08/09/2019 0130 07/17/2019 0656  BP: (!) 124/58 (!) 117/54 (!) 117/57 (!) 111/58  Pulse: 74 60 61 62  Resp: (!) 21 20 (!) 23 20  Temp:    (!) 97.2 F (36.2 C)  TempSrc:    Axillary  SpO2: 96% 100% 94%   Weight:      Height:        Physical Exam  Constitutional: He is oriented to person, place, and time. He appears well-developed and well-nourished. No distress.  HENT:  Head: Normocephalic.  Small scalp laceration noted, dried blood and no active bleeding  Eyes: EOM are normal.  Neck: Neck supple.  Cardiovascular: Normal rate, regular  rhythm and intact distal pulses.  Pulmonary/Chest: Effort normal and breath sounds normal. No respiratory distress. He has no wheezes. He has no rales.  On 2 L supplemental oxygen  Abdominal: Soft. Bowel sounds are normal. He exhibits no distension. There is no abdominal tenderness. There is no guarding.  Musculoskeletal:        General: No edema.  Neurological: He is alert and oriented to person, place, and time.  Strength 5 out of 5 in bilateral upper lower extremities Strength 4 out of 5 in bilateral lower extremities Sensation to light touch intact throughout  Skin: Skin is warm and dry. He is not diaphoretic.     Labs on Admission: I have personally reviewed following labs and imaging studies  CBC: Recent Labs  Lab 07/17/2019 2125  WBC 20.3*  NEUTROABS 17.9*  HGB 12.9*  HCT 40.4  MCV 98.3  PLT 500   Basic Metabolic Panel: Recent Labs  Lab 08/11/2019 2125  NA 135  K 4.6  CL 105  CO2 18*  GLUCOSE 161*  BUN 51*  CREATININE 1.82*  CALCIUM 8.8*   GFR: Estimated Creatinine Clearance: 32.4 mL/min (A) (by C-G formula based on SCr of 1.82 mg/dL (H)). Liver Function Tests: Recent Labs  Lab 08/04/2019 2125  AST 47*  ALT 40  ALKPHOS 73  BILITOT 0.9  PROT 7.1  ALBUMIN 3.1*   No results for input(s): LIPASE, AMYLASE in the last 168 hours. No results for input(s): AMMONIA in the last 168 hours. Coagulation Profile: No results for input(s): INR, PROTIME in the last 168 hours. Cardiac Enzymes: No results for input(s): CKTOTAL, CKMB, CKMBINDEX, TROPONINI in the last 168 hours. BNP (last 3 results) No results for input(s): PROBNP in the last 8760 hours. HbA1C: No results for input(s): HGBA1C in the last 72 hours. CBG: No results for input(s): GLUCAP in the last 168 hours. Lipid Profile: No results for input(s): CHOL, HDL, LDLCALC, TRIG, CHOLHDL, LDLDIRECT in the last 72 hours. Thyroid Function Tests: No results for input(s): TSH, T4TOTAL, FREET4, T3FREE, THYROIDAB in  the last 72 hours. Anemia Panel: No results for input(s): VITAMINB12, FOLATE, FERRITIN, TIBC, IRON, RETICCTPCT in the last 72 hours. Urine analysis:    Component Value Date/Time   BILIRUBINUR Negative 10/26/2018 1135   PROTEINUR Negative 10/26/2018 1135   UROBILINOGEN 2.0 (A) 10/26/2018 1135   NITRITE Negative 10/26/2018 1135   LEUKOCYTESUR Negative 10/26/2018 1135    Radiological Exams on Admission: Ct Head Wo Contrast  Result Date:  07/30/2019 CLINICAL DATA:  78 year old male with ataxia status post fall backwards. EXAM: CT HEAD WITHOUT CONTRAST TECHNIQUE: Contiguous axial images were obtained from the base of the skull through the vertex without intravenous contrast. COMPARISON:  MiniCAT CT paranasal sinuses 01/12/2018. FINDINGS: Brain: There is disproportionate biparietal cerebral volume loss (series 6, image 37), but elsewhere cerebral volume appears normal for age. No cortical encephalomalacia identified. No midline shift, ventriculomegaly, mass effect, evidence of mass lesion, intracranial hemorrhage or evidence of cortically based acute infarction. Gray-white matter differentiation is within normal limits throughout the brain. Vascular: Calcified atherosclerosis at the skull base. No suspicious intracranial vascular hyperdensity. Skull: No acute osseous abnormality identified. Incidental prominent occipital arachnoid granulations on series 4, image 17 (normal variant). Sinuses/Orbits: Visualized paranasal sinuses and mastoids are well pneumatized today. Other: Mild posterior vertex scalp hematoma suspected (series 6, image 29). Underlying calvarium intact. Otherwise negative orbit and scalp soft tissues. IMPRESSION: 1. Mild posterior vertex scalp soft tissue injury without underlying fracture. 2.  No acute traumatic injury to the brain identified. 3. Suggestion of disproportionate biparietal cerebral volume loss, non-specific but can be seen with dementia. Electronically Signed   By: Genevie Ann  M.D.   On: 07/30/2019 22:40   Ct Chest Wo Contrast  Result Date: 08/02/2019 CLINICAL DATA:  Shortness of breath EXAM: CT CHEST WITHOUT CONTRAST TECHNIQUE: Multidetector CT imaging of the chest was performed following the standard protocol without IV contrast. COMPARISON:  April 20, 2018 FINDINGS: Cardiovascular: Coronary artery calcifications are seen. Scattered aortic atherosclerosis. The heart size is normal. There is no pericardial thickening or effusion. Mediastinum/Nodes: Again noted are scattered mediastinal, pretracheal and prevascular lymph nodes seen. The largest within the anterior subcarinal region measuring 1.3 cm as on the prior exam. The thyroid gland, trachea and esophagus demonstrate no significant findings. Lungs/Pleura: There is new ground-glass patchy airspace opacities within the left upper lobe and throughout the right lung, predominantly within the periphery. There is also new patchy areas of consolidation within the posterior bilateral lower lungs, right greater than left. There is unchanged nodular area of streaky opacity again noted at the left lung base and along the right middle lobe. There is no pleural effusion. Upper abdomen: The visualized portion of the upper abdomen is unremarkable. Musculoskeletal/Chest wall: There is widening of the anterior inter disc space at T12-L1 which is partially visualized with linear calcifications in the inter disc space. This was not seen on the prior exam and could be due to fracture of the anterior flowing osteophyte. Again noted are anterior flowing osteophytes seen throughout the thoracic spine. IMPRESSION: 1. New ground-glass patchy airspace opacities seen within the left upper lobe and throughout the right lung predominantly within the periphery. There is also streaky areas of consolidation in the posterior lower lungs. This is nonspecific, however could be due to atypical viral pneumonia. 2. Unchanged patchy rounded airspace nodules within  the left lower lung base and right middle lobe as on prior CT dating back to 2019. 3. Unchanged scattered mediastinal lymph nodes. 4. New age-indeterminate widening of the anterior interdisc space at T12-L1 which is partially visualized and concerning for fracture of the anterior flowing osteophyte. Would correlate with patient's site of pain and further evaluation is required, would recommend dedicated MRI Electronically Signed   By: Prudencio Pair M.D.   On: 08/02/2019 02:33   Ct Cervical Spine Wo Contrast  Result Date: 07/27/2019 CLINICAL DATA:  78 year old male with ataxia status post fall backwards. EXAM: CT CERVICAL SPINE WITHOUT CONTRAST  TECHNIQUE: Multidetector CT imaging of the cervical spine was performed without intravenous contrast. Multiplanar CT image reconstructions were also generated. COMPARISON:  Head CT today reported separately. FINDINGS: Alignment: Reversal of lower cervical lordosis. Cervicothoracic junction alignment is within normal limits. Bilateral posterior element alignment is within normal limits. Skull base and vertebrae: Visualized skull base is intact. No atlanto-occipital dissociation. Incidental prominent occipital bone arachnoid granulations (normal variant). No acute osseous abnormality identified. Soft tissues and spinal canal: No visible canal hematoma. There is prevertebral fluid or edema at the C3 level demonstrated on series 9, image 118. No adjacent vertebral fracture identified. Otherwise negative visible noncontrast neck soft tissues. Disc levels: There appears to be significant degenerative cervical spinal stenosis at most levels, including at least moderate suspected stenosis at C2-C3 (series 9, image 100). Lower cervical spinal stenosis is related to bulky disc osteophyte complex. Upper chest: T2 through T4 upper thoracic ankylosis. Grossly intact visible upper thoracic levels. Osteopenia. There is patchy nonspecific ground-glass opacity in the visible right upper  lobe (series 5, images 87 and 103). Negative visible left lung apex. Negative visible noncontrast superior mediastinum. IMPRESSION: 1. No cervical spine fracture is identified, but mild prevertebral fluid raises the possibility of anterior ligamentous injury at C3. A noncontrast Cervical Spine MRI could evaluate further. 2. 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 This could predispose to blunt trauma to the cervical spinal cord in the setting of fall. 3. Nonspecific pulmonary ground-glass opacity in the right upper lobe. Consider acute viral/atypical respiratory infection. 4. 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 Electronically Signed   By: Genevie Ann M.D.   On: 07/31/2019 22:49   Mr Cervical Spine Wo Contrast  Result Date: 07/13/2019 CLINICAL DATA:  Weakness, fall hit head EXAM: MRI CERVICAL SPINE WITHOUT CONTRAST TECHNIQUE: Multiplanar, multisequence MR imaging of the cervical spine was performed. No intravenous contrast was administered. COMPARISON:  None. FINDINGS: Alignment: There is reversal of the normal cervical lordosis. A grade 1 anterolisthesis of C4 on C5 is seen measuring 3 mm. Vertebrae: There is a focal disruption of the anterior longitudinal ligament seen at C5-C6 with fluid between the cleft. There is also a probable acutely fractured anterior osteophyte at the anterior inferior endplate of C5. Increased marrow signal seen at the anterior vertebral bodies of C5, C6, C7. There is increased signal seen between the inter spinous ligament at C5-C6 with surrounding paraspinal soft tissue edema. Cord: There is increased cord signal seen at the C5 level with mild cord expansion, likely acute cord contusion. No definite blood products seen within the cord. There is increased cord signal seen from C6 through C7 which is likely due to myelomalacia. There is also minimally increased cord signal seen at C2-C3, likely due to early myelomalacia. Posterior  Fossa, vertebral arteries, paraspinal tissues: Significant prevertebral fluid and soft tissue edema is noted from C2 through C6. There is also paraspinal soft tissue edema seen posteriorly. The visualized portion of the posterior fossa is unremarkable. Normal flow voids seen within the vertebral arteries. Disc levels: C1-C2: Atlanto-axial junction is normal, without canal narrowing C2-C3: There is a disc osteophyte complex and uncovertebral osteophytes which causes moderate bilateral neural foraminal narrowing. The central thecal sac measures 4 mm in AP diameter. C3-C4: There is a disc osteophyte complex and uncovertebral osteophytes which causes severe left and moderate right neural foraminal narrowing. The central thecal sac is effaced measuring 5 mm in AP diameter. C4-C5: There is a disc osteophyte complex and uncovertebral  osteophytes which causes moderate bilateral neural foraminal narrowing. The central thecal sac is effaced measuring 6 mm in AP diameter. C5-C6: There is a disc osteophyte complex which is eccentric to the left with uncovertebral osteophytes. Severe bilateral neural foraminal narrowing is seen. There is effacement of the thecal sac which measures 3 mm in AP diameter. C6-C7: There is disc osteophyte complex and uncovertebral osteophytes which causes severe bilateral neural foraminal narrowing. There is effacement of the thecal sac which measures 4 mm in AP diameter. C7-T1: There is a disc osteophyte complex with a central disc protrusion and uncovertebral osteophytes which causes severe right and moderate to severe left neural foraminal narrowing. Central thecal sac is effaced measuring 5 mm in AP diameter. IMPRESSION: 1. Acute disruption of the anterior longitudinal ligament at C5-C6 with a acute fracture of the anterior inferior endplate of the C5 osteophyte . Significant prevertebral soft tissue edema and fluid is seen extending from C2 through C6. 2. Marrow edema/osseous contusions involving  the C5-C7 anterior vertebral bodies. 3. interspinous ligamentous injury at C5-C6. 4. Findings suggestive of acute cord contusion seen at C5 with cord edema. No definite epidural hematoma or hemorrhage. 5. 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 6. 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 These results were called by telephone at the time of interpretation on 07/12/2019 at 3:39am to provider [facility] , who verbally acknowledged these results. Electronically Signed   By: Prudencio Pair M.D.   On: 07/25/2019 03:39   Mr Lumbar Spine Wo Contrast  Addendum Date: 07/17/2019   ADDENDUM REPORT: 07/15/2019 03:46 ADDENDUM: Study discussed by telephone with Dr. Shela Leff on 08/11/2019 at 0340 hours. Electronically Signed   By: Genevie Ann M.D.   On: 07/30/2019 03:46   Result Date: 07/27/2019 CLINICAL DATA:  78 year old male with widespread thoracic spine ankylosis status post fall. Suspected injury through the disc space at T11-T12 on chest CT today. EXAM: MRI LUMBAR SPINE WITHOUT CONTRAST TECHNIQUE: Multiplanar, multisequence MR imaging of the lumbar spine was performed. No intravenous contrast was administered. COMPARISON:  Chest CT earlier today. FINDINGS: Segmentation: In conjunction with the chest CT today, the L5 level is sacralized with a vestigial L5-S1 disc space. Correlation with radiographs is recommended prior to any operative intervention. Alignment: Straightening of lumbar lordosis. Mild focal lordosis at T12-L1. Vertebrae: Widespread thoracic spine ankylosis which likely continued through the T12-L1 disc space. However, there is injury through that disc space and distraction anteriorly resulting in mild lordosis (series 6, image 9). Disrupted anterior longitudinal ligament. Posttraumatic signal changes in the disc. There is an associated nondisplaced  fracture through the L1 spinous process (same image). And possibly a fracture also of the right T12 lamina/facet (series 7, image 7). In addition, there is mild compression of the T12 and L1 endplates with mild marrow edema, more pronounced along the right lateral L1 superior endplate. Normal background bone marrow signal. The L2 through L4 levels appear to remain intact. Sacralized L5. Intact visible sacrum and SI joints. Conus medullaris and cauda equina: Conus extends to the L1 level. No lower spinal cord or conus signal abnormality. Paraspinal and other soft tissues: There is edema tracking along the right chorea of the diaphragm and lateral to the right psoas muscle. Less pronounced left-side paraspinal soft tissue edema. There is mild posterior paraspinal muscle injury at the L1 level greater on the left. Additionally, there  is probably a posttraumatic hematoma tracking in the lateral fibers of the right lumbar paraspinal muscles at L2 and L3 (series 8, image 18). This is not included on the sagittal images. Partially visible distended urinary bladder. Disc levels: Ordinary although advanced lumbar spine disc and endplate degeneration. Severe disc space loss with bulky disc osteophyte complex throughout the lumbar spine. Subsequent -Severe right lateral recess stenosis at L2-L3 (series 8, image 18, descending right L3 nerve level). -moderate spinal and severe bilateral lateral recess stenosis at L3-L4 (descending L4 nerve levels). -severe right greater than left lateral recess stenosis at L4-L5 (series 8, image 27) with mild spinal stenosis (L5 nerve levels). IMPRESSION: 1. Acute fracture through ankylosed spine at the T12-L1 disc space. Disrupted anterior longitudinal ligament, disc, and propagation of the fracture through the posterior elements including the L1 spinous process. This is most likely an unstable injury. Recommend Neurosurgery consultation. 2. Associated paraspinal muscle injury, including a  small intramuscular hematoma suspected along the lateral right erector spinae fibers at L2-L3. 3. Transitional lumbosacral anatomy, with sacralized L5 level and vestigial L5-S1 disc space. 4. Superimposed lumbar spine degeneration: advanced lumbar disc and endplate disease with multilevel severe lateral recess stenosis, moderate L3-L4 spinal stenosis. 5. Distended urinary bladder. Electronically Signed: By: Genevie Ann M.D. On: 07/12/2019 03:23    EKG: Independently reviewed.  Sinus rhythm.  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 Significant prevertebral soft tissue edema and fluid is seen extending from C2 through C6. Marrow edema/osseous contusions involving the C5-C7 anterior vertebral bodies. Interspinous ligamentous injury at C5-C6. Findings suggestive of acute cord contusion seen at C5 with cord edema. No definite epidural hematoma or hemorrhage. 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 -We will need to be addressed operatively, neurosurgery following -Maintain Aspen c-collar at all times -Surgical planning to be determined by Dr. Kathyrn Sheriff -Keep n.p.o. -Dilaudid as needed for pain  T12-L1 disc space fracture MRI of lumbar spine showing acute fracture through ankylosed spine at the T12-L1 disc space. Disrupted anterior longitudinal ligament, disc, and  propagation of the fracture through the posterior elements including the L1 spinous process.  -Unstable fracture needs to be addressed surgically.  Neurosurgery following. -Strict bedrest at all times, HOB max 15 degrees, logroll only -Surgical planning to be determined by Dr. Kathyrn Sheriff -Keep n.p.o. -Dilaudid as needed for pain  History of CAD Given history of prior abnormal stress test, neurosurgery requesting cardiac clearance prior to surgery.  EKG currently not suggestive of ACS.   -Echocardiogram -Consult cardiology in a.m. -Continue beta-blocker and statin  Bladder distention on CT -Check postvoid residual volume.  If signs of acute urinary retention, Place Foley.  Acute hypoxic respiratory failure secondary to multifocal pneumonia Oxygen saturation in the 80s on room air, satting well on 2 L supplemental oxygen.  Afebrile.  White blood cell count 20.3 with left shift.  Chest CT showing new groundglass patchy airspace opacities within  the left upper lobe and throughout the right lung predominantly within the periphery.  There is also streaky areas of consolidation in the posterior lower lungs.  Findings concerning for viral/atypical pneumonia.  SARS-CoV-2 test negative. -Ceftriaxone and azithromycin -Check procalcitonin level -Influenza panel -RVP -Check inflammatory markers -Continue airborne and contact precautions at this time -Continuous pulse ox -Supplemental oxygen  Normal anion gap metabolic acidosis Bicarb 18, anion gap 12.  Not endorsing diarrhea and not on a diuretic. -Continue to monitor BMP  CKD stage III -Stable.  Creatinine at baseline.  Well-controlled insulin-dependent diabetes mellitus A1c 5.1 on 10/19. -Takes Lantus 20 units at bedtime.  Reduce dose to 10 units as currently n.p.o. -Sliding scale insulin sensitive and CBG checks   COPD -Stable.  No cough or wheezing.  Continue home inhalers.  DVT prophylaxis: SCDs Code Status: Full code Family  Communication: No family available. Disposition Plan: Anticipate discharge after clinical improvement. Consults called: None Admission status: It is my clinical opinion that admission to INPATIENT is reasonable and necessary in this 78 y.o. 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  Also has acute hypoxic respiratory failure secondary to multifocal pneumonia.  High risk of decompensation.  Given the aforementioned, the predictability of an adverse outcome is felt to be significant. I expect that the patient will require at least 2 midnights in the hospital to treat this condition.   The medical decision making on this patient was of high complexity and the patient is at high risk for clinical deterioration, therefore this is a level 3 visit.  Shela Leff MD [facility] Pager [phone]  If 7PM-7AM, please contact night-coverage [email or web address] Password [facility]  07/15/2019, 7:47 AM

## 2019-07-12 NOTE — ED Provider Notes (Signed)
[facility] EMERGENCY DEPARTMENT Provider Note   CSN: [id number] Arrival date & time: 07/27/2019  1831     History   Chief Complaint Chief Complaint  Patient presents with  . Fall    HPI Lucas Price is a 78 y.o. male.     Patient states that he tried to walk today and his legs got weak and he fell and hit his head no loss of consciousness.  Both legs seem very weak.  Patient also now complains of numbness running down both arms  The history is provided by the patient. No language interpreter was used.  Fall This is a new problem. The current episode started 3 to 5 hours ago. The problem occurs rarely. The problem has been resolved. Pertinent negatives include no chest pain, no abdominal pain and no headaches. Nothing aggravates the symptoms. Nothing relieves the symptoms. He has tried nothing for the symptoms. The treatment provided no relief.    Past Medical History:  Diagnosis Date  . Anemia 05/26/2014  . Bladder neck obstruction 04/24/2014  . Chronic respiratory failure with hypoxia ([place])   . CKD (chronic kidney disease) 05/26/2014  . COPD (chronic obstructive pulmonary disease) ([place]) 05/26/2014  . Coronary artery calcification seen on CAT scan   . Diabetes mellitus ([place]) 05/26/2014  . Former smoker 05/26/2014  . GERD (gastroesophageal reflux disease) 05/26/2014  . Glaucoma   . Heart murmur    mild  . HTN (hypertension) 05/26/2014  . Ischemic heart disease    Lexiscan myoview stress test 02/08/2018 - no stress sx reported.  . Lung nodules   . 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 Recurrent urinary tract infection 09/05/2013  . Renal calculus 03/19/2015  . Retention of urine 09/05/2013  . Sleep apnea    wears Bipap  . SOB (shortness of breath)     Patient Active Problem List   Diagnosis Date Noted  . 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 Ischemic heart disease 03/03/2019  . Coronary artery calcification  seen on CAT scan 03/03/2019  . Morbid obesity ([place]) 06/06/2018  . 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 Rhinitis, chronic 01/12/2018  . 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 Chronic respiratory failure with hypoxia ([place]), on O2 at home, followed by Pulmonology 11/05/2017  . 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 Abnormal finding on GI tract imaging   . Benign neoplasm of ascending colon   . Pulmonary nodules/lesions, multiple 09/08/2017  . 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 Renal calculus 03/19/2015  . Anemia in chronic kidney disease (CKD) 05/26/2014  . CKD (chronic kidney disease) stage 3, GFR 30-59 ml/min ([place]), followed by Renal 05/26/2014  . COPD (chronic obstructive pulmonary disease) ([place]) 05/26/2014  . Insulin dependent diabetes with renal manifestation ([place]), on glimepiride, Lantus, Januvia 05/26/2014  . Former smoker 05/26/2014  . GERD (gastroesophageal reflux disease) 05/26/2014  . 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 Bladder neck obstruction 04/24/2014  . Recurrent urinary tract infection 09/05/2013  . Retention of urine 09/05/2013    Past Surgical History:  Procedure Laterality Date  . BLADDER NECK RECONSTRUCTION    . CATARACT EXTRACTION, BILATERAL    . COLONOSCOPY WITH PROPOFOL N/A 09/22/2017   Procedure: COLONOSCOPY WITH PROPOFOL;  Surgeon: Doran Stabler, MD;  Location: [facility];  Service: Gastroenterology;  Laterality: N/A;  . HERNIA REPAIR Bilateral    Inguinal  .  VIDEO BRONCHOSCOPY WITH ENDOBRONCHIAL NAVIGATION N/A 10/07/2017   Procedure: VIDEO BRONCHOSCOPY WITH ENDOBRONCHIAL NAVIGATION;  Surgeon: Collene Gobble, MD;  Location: [facility] OR;  Service: Thoracic;  Laterality: N/A;  . VIDEO BRONCHOSCOPY WITH ENDOBRONCHIAL ULTRASOUND N/A 10/07/2017   Procedure: VIDEO BRONCHOSCOPY WITH ENDOBRONCHIAL ULTRASOUND;  Surgeon: Collene Gobble, MD;  Location: [facility] OR;   Service: Thoracic;  Laterality: N/A;        Home Medications    Prior to Admission medications   Medication Sig Start Date End Date Taking? Authorizing Provider  allopurinol (ZYLOPRIM) 100 MG tablet Take 1 tablet (100 mg total) by mouth daily. 01/14/19  Yes Briscoe Deutscher, DO  aspirin EC 81 MG tablet Take 81 mg by mouth daily.    Yes [provider]  atorvastatin (LIPITOR) 10 MG tablet TAKE 1 TABLET EVERY DAY Patient taking differently: Take 10 mg by mouth every morning.  05/27/19  Yes Miquel Dunn, NP  budesonide-formoterol [facility]) 160-4.5 MCG/ACT inhaler Inhale 2 puffs into the lungs 2 (two) times daily. Patient taking differently: Inhale 2 puffs into the lungs every morning.  04/01/18  Yes Brand Males, MD  calcium carbonate (CALCIUM 600) 600 MG TABS tablet Take 600 mg by mouth daily.    Yes [provider]  Cholecalciferol (D 10000) 10000 units CAPS Take 10,000 Units by mouth every other day.    Yes [provider]  Coenzyme Q-10 200 MG CAPS Take 200 mg by mouth daily.    Yes [provider]  dorzolamide (TRUSOPT) 2 % ophthalmic solution Place 1 drop into both eyes every morning.  10/29/17  Yes [provider]  Insulin Syringe-Needle U-100 (BD INSULIN SYRINGE ULTRAFINE) 31G X 5/16" 0.5 ML MISC 1 each by Does not apply route daily. 08/23/18  Yes Briscoe Deutscher, DO  ketorolac (ACULAR) 0.5 % ophthalmic solution Place 1 drop into both eyes 3 (three) times daily.  06/02/18  Yes [provider]  LANTUS 100 UNIT/ML injection INJECT  50 UNITS SUBCUTANEOUSLY AT BEDTIME Patient taking differently: Inject 20 Units into the skin at bedtime.  07/10/19  Yes Leamon Arnt, MD  latanoprost (XALATAN) 0.005 % ophthalmic solution Place 1 drop into both eyes at bedtime.  05/11/19  Yes [provider]  metoprolol succinate (TOPROL-XL) 50 MG 24 hr tablet TAKE 1 TABLET EVERY DAY Patient taking differently: Take 50 mg by mouth daily.   06/13/19  Yes Marin Olp, MD  OXYGEN Inhale 1.5 L into the lungs continuous.    Yes [provider]  sitaGLIPtin (JANUVIA) 50 MG tablet 1 tab daily Patient taking differently: Take 50 mg by mouth daily.  01/14/19  Yes Briscoe Deutscher, DO  SPIRIVA RESPIMAT 2.5 MCG/ACT AERS INHALE 2 PUFFS INTO THE LUNGS DAILY. Patient taking differently: Inhale 2 puffs into the lungs every morning.  04/05/18  Yes Marin Olp, MD  Syringe, Disposable, 1 ML MISC Use with insulin needle. 02/19/17  Yes Briscoe Deutscher, DO  Travoprost, BAK Free, (TRAVATAN Z) 0.004 % SOLN ophthalmic solution Place 1 drop in both eyes once daily 05/13/19  Yes Briscoe Deutscher, DO  triamcinolone (KENALOG) 0.1 % paste Use as directed 1 application in the mouth or throat 2 (two) times daily. Patient taking differently: Use as directed 1 application in the mouth or throat 2 (two) times daily as needed (skin care).  01/11/19  Yes Briscoe Deutscher, DO  HYDROcodone-acetaminophen (NORCO) 10-325 MG tablet Take 1 tablet by mouth every 8 (eight) hours as needed. Patient not taking: Reported  on 08/05/2019 11/10/18   Briscoe Deutscher, DO    Family History Family History  Problem Relation Age of Onset  . Heart disease Mother   . Diabetes Mother     Social History Social History   Tobacco Use  . Smoking status: Former Smoker    Packs/day: 0.10    Years: 2.00    Pack years: 0.20    Types: Cigarettes    Quit date: 12/31/2007    Years since quitting: 11.5  . Smokeless tobacco: Former Systems developer  . Tobacco comment: smokes 4-5 every other day  Substance Use Topics  . Alcohol use: No  . Drug use: No     Allergies   Patient has no known allergies.   Review of Systems Review of Systems  Constitutional: Negative for appetite change and fatigue.  HENT: Negative for congestion, ear discharge and sinus pressure.   Eyes: Negative for discharge.  Respiratory: Negative for cough.   Cardiovascular: Negative for chest pain.  Gastrointestinal:  Negative for abdominal pain and diarrhea.  Genitourinary: Negative for frequency and hematuria.  Musculoskeletal: Negative for back pain.       Numbness in both arms with weakness in legs.  Skin: Negative for rash.  Neurological: Negative for seizures and headaches.  Psychiatric/Behavioral: Negative for hallucinations.     Physical Exam Updated Vital Signs BP (!) 125/58   Pulse 61   Temp (!) 97.5 F (36.4 C) (Oral)   Resp 18   Ht 5\' 3"  (1.6 m)   Wt 80.7 kg   SpO2 97%   BMI 31.53 kg/m   Physical Exam Vitals signs reviewed.  Constitutional:      Appearance: He is well-developed.  HENT:     Head: Normocephalic.     Nose: Nose normal.  Eyes:     General: No scleral icterus.    Conjunctiva/sclera: Conjunctivae normal.  Neck:     Musculoskeletal: Neck supple.     Thyroid: No thyromegaly.  Cardiovascular:     Rate and Rhythm: Normal rate and regular rhythm.     Heart sounds: No murmur. No friction rub. No gallop.   Pulmonary:     Breath sounds: No stridor. No wheezing or rales.  Chest:     Chest wall: No tenderness.  Abdominal:     General: There is no distension.     Tenderness: There is no abdominal tenderness. There is no rebound.  Genitourinary:    Comments: Rectal exam normal sphincter tone Musculoskeletal: Normal range of motion.     Comments:   Patient has some mild numbness to both upper extremities proximal to the elbow.  He has significant weakness of both legs he can lift them off the bed but cannot hold them up for more than split-second.  Lymphadenopathy:     Cervical: No cervical adenopathy.  Skin:    Findings: No erythema or rash.  Neurological:     Mental Status: He is oriented to person, place, and time.     Motor: No abnormal muscle tone.     Coordination: Coordination normal.  Psychiatric:        Behavior: Behavior normal.      ED Treatments / Results  Labs (all labs ordered are listed, but only abnormal results are displayed) Labs  Reviewed  CBC WITH DIFFERENTIAL/PLATELET - Abnormal; Notable for the following components:      Result Value   WBC 20.3 (*)    RBC 4.11 (*)    Hemoglobin 12.9 (*)  Neutro Abs 17.9 (*)    Abs Immature Granulocytes 0.17 (*)    All other components within normal limits  COMPREHENSIVE METABOLIC PANEL - Abnormal; Notable for the following components:   CO2 18 (*)    Glucose, Bld 161 (*)    BUN 51 (*)    Creatinine, Ser 1.82 (*)    Calcium 8.8 (*)    Albumin 3.1 (*)    AST 47 (*)    GFR calc non Af Amer 35 (*)    GFR calc Af Amer 41 (*)    All other components within normal limits  SARS CORONAVIRUS 2 (TAT 6-24 HRS)  URINALYSIS, ROUTINE W REFLEX MICROSCOPIC    EKG EKG Interpretation  Date/Time:  Tuesday July 12 2019 20:08:05 EST Ventricular Rate:  61 PR Interval:    QRS Duration: 99 QT Interval:  428 QTC Calculation: 432 R Axis:   85 Text Interpretation: Sinus rhythm Atrial premature complex Borderline right axis deviation Baseline wander in lead(s) V2 Confirmed by Milton Ferguson [phone]) on 07/15/2019 10:44:44 PM   Radiology Ct Head Wo Contrast  Result Date: 07/31/2019 CLINICAL DATA:  78 year old male with ataxia status post fall backwards. EXAM: CT HEAD WITHOUT CONTRAST TECHNIQUE: Contiguous axial images were obtained from the base of the skull through the vertex without intravenous contrast. COMPARISON:  MiniCAT CT paranasal sinuses 01/12/2018. FINDINGS: Brain: There is disproportionate biparietal cerebral volume loss (series 6, image 37), but elsewhere cerebral volume appears normal for age. No cortical encephalomalacia identified. No midline shift, ventriculomegaly, mass effect, evidence of mass lesion, intracranial hemorrhage or evidence of cortically based acute infarction. Gray-white matter differentiation is within normal limits throughout the brain. Vascular: Calcified atherosclerosis at the skull base. No suspicious intracranial vascular hyperdensity. Skull: No acute  osseous abnormality identified. Incidental prominent occipital arachnoid granulations on series 4, image 17 (normal variant). Sinuses/Orbits: Visualized paranasal sinuses and mastoids are well pneumatized today. Other: Mild posterior vertex scalp hematoma suspected (series 6, image 29). Underlying calvarium intact. Otherwise negative orbit and scalp soft tissues. IMPRESSION: 1. Mild posterior vertex scalp soft tissue injury without underlying fracture. 2.  No acute traumatic injury to the brain identified. 3. Suggestion of disproportionate biparietal cerebral volume loss, non-specific but can be seen with dementia. Electronically Signed   By: Genevie Ann M.D.   On: 08/06/2019 22:40   Ct Cervical Spine Wo Contrast  Result Date: 07/28/2019 CLINICAL DATA:  78 year old male with ataxia status post fall backwards. EXAM: CT CERVICAL SPINE WITHOUT CONTRAST TECHNIQUE: Multidetector CT imaging of the cervical spine was performed without intravenous contrast. Multiplanar CT image reconstructions were also generated. COMPARISON:  Head CT today reported separately. FINDINGS: Alignment: Reversal of lower cervical lordosis. Cervicothoracic junction alignment is within normal limits. Bilateral posterior element alignment is within normal limits. Skull base and vertebrae: Visualized skull base is intact. No atlanto-occipital dissociation. Incidental prominent occipital bone arachnoid granulations (normal variant). No acute osseous abnormality identified. Soft tissues and spinal canal: No visible canal hematoma. There is prevertebral fluid or edema at the C3 level demonstrated on series 9, image 118. No adjacent vertebral fracture identified. Otherwise negative visible noncontrast neck soft tissues. Disc levels: There appears to be significant degenerative cervical spinal stenosis at most levels, including at least moderate suspected stenosis at C2-C3 (series 9, image 100). Lower cervical spinal stenosis is related to bulky disc  osteophyte complex. Upper chest: T2 through T4 upper thoracic ankylosis. Grossly intact visible upper thoracic levels. Osteopenia. There is patchy nonspecific ground-glass opacity in the visible  right upper lobe (series 5, images 87 and 103). Negative visible left lung apex. Negative visible noncontrast superior mediastinum. IMPRESSION: 1. No cervical spine fracture is identified, but mild prevertebral fluid raises the possibility of anterior ligamentous injury at C3. A noncontrast Cervical Spine MRI could evaluate further. 2. 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 This could predispose to blunt trauma to the cervical spinal cord in the setting of fall. 3. Nonspecific pulmonary ground-glass opacity in the right upper lobe. Consider acute viral/atypical respiratory infection. 4. 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 Electronically Signed   By: Genevie Ann M.D.   On: 07/25/2019 22:49    Procedures Procedures (including critical care time)  Medications Ordered in ED Medications  sodium chloride 0.9 % bolus 1,000 mL (1,000 mLs Intravenous New Bag/Given 08/08/2019 2249)  HYDROmorphone (DILAUDID) injection 1 mg (1 mg Intravenous Given 07/25/2019 2041)  HYDROmorphone (DILAUDID) injection 0.5 mg (0.5 mg Intravenous Given 07/17/2019 2249)     Initial Impression / Assessment and Plan / ED Course  I have reviewed the triage vital signs and the nursing notes.  Pertinent labs & imaging results that were available during my care of the patient were reviewed by me and considered in my medical decision making (see chart for details). Labs show a leukocytosis of 20,000.  Chemistries show mildly elevated glucose.  Urinalysis pending.  CT of the head shows no bleeding or trauma CT the neck shows no fracture but questionable ligamental injury.  Patient will get an MRI of his neck and his lumbar spine.  He will be admitted to medicine        Final Clinical  Impressions(s) / ED Diagnoses   Final diagnoses:  None    ED Discharge Orders    None       Milton Ferguson, MD 07/14/2019 2314

## 2019-07-12 NOTE — ED Notes (Signed)
Pt on 3L 02 per [place]

## 2019-07-12 NOTE — ED Notes (Signed)
Placed pt on 5L 02 per [place]

## 2019-07-13 ENCOUNTER — Inpatient Hospital Stay (HOSPITAL_COMMUNITY): Payer: Medicare Other

## 2019-07-13 ENCOUNTER — Inpatient Hospital Stay (HOSPITAL_COMMUNITY): Payer: Medicare Other | Admitting: Certified Registered Nurse Anesthetist

## 2019-07-13 ENCOUNTER — Encounter (HOSPITAL_COMMUNITY): Payer: Self-pay | Admitting: Certified Registered Nurse Anesthetist

## 2019-07-13 ENCOUNTER — Telehealth: Payer: Self-pay | Admitting: Family Medicine

## 2019-07-13 ENCOUNTER — Observation Stay (HOSPITAL_COMMUNITY): Payer: Medicare Other

## 2019-07-13 ENCOUNTER — Inpatient Hospital Stay (HOSPITAL_COMMUNITY): Admission: EM | Disposition: E | Payer: Self-pay | Source: Home / Self Care | Attending: Neurosurgery

## 2019-07-13 ENCOUNTER — Other Ambulatory Visit: Payer: Self-pay | Admitting: Neurosurgery

## 2019-07-13 DIAGNOSIS — Q069 Congenital malformation of spinal cord, unspecified: Secondary | ICD-10-CM | POA: Diagnosis not present

## 2019-07-13 DIAGNOSIS — N186 End stage renal disease: Secondary | ICD-10-CM | POA: Diagnosis not present

## 2019-07-13 DIAGNOSIS — I82409 Acute embolism and thrombosis of unspecified deep veins of unspecified lower extremity: Secondary | ICD-10-CM | POA: Diagnosis not present

## 2019-07-13 DIAGNOSIS — E875 Hyperkalemia: Secondary | ICD-10-CM | POA: Diagnosis not present

## 2019-07-13 DIAGNOSIS — A419 Sepsis, unspecified organism: Secondary | ICD-10-CM | POA: Diagnosis not present

## 2019-07-13 DIAGNOSIS — Z66 Do not resuscitate: Secondary | ICD-10-CM | POA: Diagnosis not present

## 2019-07-13 DIAGNOSIS — S22089A Unspecified fracture of T11-T12 vertebra, initial encounter for closed fracture: Secondary | ICD-10-CM | POA: Diagnosis not present

## 2019-07-13 DIAGNOSIS — I132 Hypertensive heart and chronic kidney disease with heart failure and with stage 5 chronic kidney disease, or end stage renal disease: Secondary | ICD-10-CM | POA: Diagnosis present

## 2019-07-13 DIAGNOSIS — S32019A Unspecified fracture of first lumbar vertebra, initial encounter for closed fracture: Secondary | ICD-10-CM

## 2019-07-13 DIAGNOSIS — S22088A Other fracture of T11-T12 vertebra, initial encounter for closed fracture: Secondary | ICD-10-CM | POA: Diagnosis not present

## 2019-07-13 DIAGNOSIS — S14129S Central cord syndrome at unspecified level of cervical spinal cord, sequela: Secondary | ICD-10-CM | POA: Diagnosis not present

## 2019-07-13 DIAGNOSIS — G9340 Encephalopathy, unspecified: Secondary | ICD-10-CM | POA: Diagnosis not present

## 2019-07-13 DIAGNOSIS — Z01810 Encounter for preprocedural cardiovascular examination: Secondary | ICD-10-CM | POA: Diagnosis not present

## 2019-07-13 DIAGNOSIS — Z794 Long term (current) use of insulin: Secondary | ICD-10-CM | POA: Diagnosis not present

## 2019-07-13 DIAGNOSIS — N179 Acute kidney failure, unspecified: Secondary | ICD-10-CM | POA: Diagnosis not present

## 2019-07-13 DIAGNOSIS — N170 Acute kidney failure with tubular necrosis: Secondary | ICD-10-CM | POA: Diagnosis not present

## 2019-07-13 DIAGNOSIS — I50810 Right heart failure, unspecified: Secondary | ICD-10-CM | POA: Diagnosis not present

## 2019-07-13 DIAGNOSIS — J449 Chronic obstructive pulmonary disease, unspecified: Secondary | ICD-10-CM

## 2019-07-13 DIAGNOSIS — I129 Hypertensive chronic kidney disease with stage 1 through stage 4 chronic kidney disease, or unspecified chronic kidney disease: Secondary | ICD-10-CM | POA: Diagnosis not present

## 2019-07-13 DIAGNOSIS — I2510 Atherosclerotic heart disease of native coronary artery without angina pectoris: Secondary | ICD-10-CM

## 2019-07-13 DIAGNOSIS — S14129D Central cord syndrome at unspecified level of cervical spinal cord, subsequent encounter: Secondary | ICD-10-CM | POA: Diagnosis not present

## 2019-07-13 DIAGNOSIS — K5903 Drug induced constipation: Secondary | ICD-10-CM | POA: Diagnosis not present

## 2019-07-13 DIAGNOSIS — R739 Hyperglycemia, unspecified: Secondary | ICD-10-CM | POA: Diagnosis not present

## 2019-07-13 DIAGNOSIS — G8250 Quadriplegia, unspecified: Secondary | ICD-10-CM | POA: Diagnosis present

## 2019-07-13 DIAGNOSIS — Z20828 Contact with and (suspected) exposure to other viral communicable diseases: Secondary | ICD-10-CM | POA: Diagnosis not present

## 2019-07-13 DIAGNOSIS — E872 Acidosis: Secondary | ICD-10-CM | POA: Diagnosis not present

## 2019-07-13 DIAGNOSIS — N1831 Chronic kidney disease, stage 3a: Secondary | ICD-10-CM | POA: Diagnosis not present

## 2019-07-13 DIAGNOSIS — M47812 Spondylosis without myelopathy or radiculopathy, cervical region: Secondary | ICD-10-CM | POA: Diagnosis not present

## 2019-07-13 DIAGNOSIS — S32018A Other fracture of first lumbar vertebra, initial encounter for closed fracture: Secondary | ICD-10-CM | POA: Diagnosis not present

## 2019-07-13 DIAGNOSIS — J9621 Acute and chronic respiratory failure with hypoxia: Secondary | ICD-10-CM | POA: Diagnosis present

## 2019-07-13 DIAGNOSIS — S14125A Central cord syndrome at C5 level of cervical spinal cord, initial encounter: Secondary | ICD-10-CM | POA: Diagnosis present

## 2019-07-13 DIAGNOSIS — J189 Pneumonia, unspecified organism: Secondary | ICD-10-CM

## 2019-07-13 DIAGNOSIS — S14129A Central cord syndrome at unspecified level of cervical spinal cord, initial encounter: Secondary | ICD-10-CM | POA: Diagnosis not present

## 2019-07-13 DIAGNOSIS — I9581 Postprocedural hypotension: Secondary | ICD-10-CM | POA: Diagnosis not present

## 2019-07-13 DIAGNOSIS — R570 Cardiogenic shock: Secondary | ICD-10-CM | POA: Diagnosis not present

## 2019-07-13 DIAGNOSIS — J9601 Acute respiratory failure with hypoxia: Secondary | ICD-10-CM | POA: Diagnosis not present

## 2019-07-13 DIAGNOSIS — E870 Hyperosmolality and hypernatremia: Secondary | ICD-10-CM | POA: Diagnosis not present

## 2019-07-13 DIAGNOSIS — N183 Chronic kidney disease, stage 3 unspecified: Secondary | ICD-10-CM

## 2019-07-13 DIAGNOSIS — W108XXA Fall (on) (from) other stairs and steps, initial encounter: Secondary | ICD-10-CM | POA: Diagnosis present

## 2019-07-13 DIAGNOSIS — Z9981 Dependence on supplemental oxygen: Secondary | ICD-10-CM | POA: Diagnosis not present

## 2019-07-13 DIAGNOSIS — R578 Other shock: Secondary | ICD-10-CM | POA: Diagnosis not present

## 2019-07-13 DIAGNOSIS — M4802 Spinal stenosis, cervical region: Secondary | ICD-10-CM | POA: Diagnosis not present

## 2019-07-13 DIAGNOSIS — E1151 Type 2 diabetes mellitus with diabetic peripheral angiopathy without gangrene: Secondary | ICD-10-CM | POA: Diagnosis present

## 2019-07-13 DIAGNOSIS — E1122 Type 2 diabetes mellitus with diabetic chronic kidney disease: Secondary | ICD-10-CM

## 2019-07-13 DIAGNOSIS — E1165 Type 2 diabetes mellitus with hyperglycemia: Secondary | ICD-10-CM | POA: Diagnosis present

## 2019-07-13 DIAGNOSIS — J410 Simple chronic bronchitis: Secondary | ICD-10-CM | POA: Diagnosis not present

## 2019-07-13 DIAGNOSIS — J42 Unspecified chronic bronchitis: Secondary | ICD-10-CM | POA: Diagnosis not present

## 2019-07-13 DIAGNOSIS — M6281 Muscle weakness (generalized): Secondary | ICD-10-CM | POA: Diagnosis not present

## 2019-07-13 DIAGNOSIS — Z981 Arthrodesis status: Secondary | ICD-10-CM

## 2019-07-13 DIAGNOSIS — E1121 Type 2 diabetes mellitus with diabetic nephropathy: Secondary | ICD-10-CM | POA: Diagnosis not present

## 2019-07-13 DIAGNOSIS — Z419 Encounter for procedure for purposes other than remedying health state, unspecified: Secondary | ICD-10-CM | POA: Diagnosis present

## 2019-07-13 DIAGNOSIS — R6521 Severe sepsis with septic shock: Secondary | ICD-10-CM | POA: Diagnosis not present

## 2019-07-13 HISTORY — PX: ANTERIOR CERVICAL DECOMP/DISCECTOMY FUSION: SHX1161

## 2019-07-13 LAB — LACTATE DEHYDROGENASE: LDH: 210 U/L — ABNORMAL HIGH (ref 98–192)

## 2019-07-13 LAB — URINALYSIS, ROUTINE W REFLEX MICROSCOPIC
Bilirubin Urine: NEGATIVE
Glucose, UA: NEGATIVE mg/dL
Ketones, ur: NEGATIVE mg/dL
Nitrite: NEGATIVE
Protein, ur: NEGATIVE mg/dL
Specific Gravity, Urine: 1.016 (ref 1.005–1.030)
pH: 5 (ref 5.0–8.0)

## 2019-07-13 LAB — D-DIMER, QUANTITATIVE: D-Dimer, Quant: 12.62 ug/mL-FEU — ABNORMAL HIGH (ref 0.00–0.50)

## 2019-07-13 LAB — SARS CORONAVIRUS 2 (TAT 6-24 HRS): SARS Coronavirus 2: NEGATIVE

## 2019-07-13 LAB — BASIC METABOLIC PANEL
Anion gap: 9 (ref 5–15)
BUN: 55 mg/dL — ABNORMAL HIGH (ref 8–23)
CO2: 19 mmol/L — ABNORMAL LOW (ref 22–32)
Calcium: 8.2 mg/dL — ABNORMAL LOW (ref 8.9–10.3)
Chloride: 106 mmol/L (ref 98–111)
Creatinine, Ser: 1.89 mg/dL — ABNORMAL HIGH (ref 0.61–1.24)
GFR calc Af Amer: 39 mL/min — ABNORMAL LOW (ref >60)
GFR calc non Af Amer: 34 mL/min — ABNORMAL LOW (ref >60)
Glucose, Bld: 150 mg/dL — ABNORMAL HIGH (ref 70–99)
Potassium: 5.1 mmol/L (ref 3.5–5.1)
Sodium: 134 mmol/L — ABNORMAL LOW (ref 135–145)

## 2019-07-13 LAB — CBC
HCT: 34.1 % — ABNORMAL LOW (ref 39.0–52.0)
Hemoglobin: 10.8 g/dL — ABNORMAL LOW (ref 13.0–17.0)
MCH: 31.4 pg (ref 26.0–34.0)
MCHC: 31.7 g/dL (ref 30.0–36.0)
MCV: 99.1 fL (ref 80.0–100.0)
Platelets: 151 10*3/uL (ref 150–400)
RBC: 3.44 MIL/uL — ABNORMAL LOW (ref 4.22–5.81)
RDW: 14.6 % (ref 11.5–15.5)
WBC: 12.1 10*3/uL — ABNORMAL HIGH (ref 4.0–10.5)
nRBC: 0 % (ref 0.0–0.2)

## 2019-07-13 LAB — CBG MONITORING, ED: Glucose-Capillary: 166 mg/dL — ABNORMAL HIGH (ref 70–99)

## 2019-07-13 LAB — GLUCOSE, CAPILLARY
Glucose-Capillary: 143 mg/dL — ABNORMAL HIGH (ref 70–99)
Glucose-Capillary: 153 mg/dL — ABNORMAL HIGH (ref 70–99)

## 2019-07-13 LAB — ECHOCARDIOGRAM COMPLETE
Height: 63 in
Weight: 2848 oz

## 2019-07-13 LAB — FERRITIN: Ferritin: 180 ng/mL (ref 24–336)

## 2019-07-13 LAB — MRSA PCR SCREENING: MRSA by PCR: NEGATIVE

## 2019-07-13 LAB — FIBRINOGEN: Fibrinogen: 513 mg/dL — ABNORMAL HIGH (ref 210–475)

## 2019-07-13 LAB — PROCALCITONIN: Procalcitonin: 0.45 ng/mL

## 2019-07-13 LAB — C-REACTIVE PROTEIN: CRP: 3.9 mg/dL — ABNORMAL HIGH (ref <1.0)

## 2019-07-13 LAB — ABO/RH: ABO/RH(D): O POS

## 2019-07-13 SURGERY — ANTERIOR CERVICAL DECOMPRESSION/DISCECTOMY FUSION 3 LEVELS
Anesthesia: General | Site: Spine Cervical

## 2019-07-13 MED ORDER — METHOCARBAMOL 1000 MG/10ML IJ SOLN
500.0000 mg | Freq: Four times a day (QID) | INTRAVENOUS | Status: DC | PRN
  Filled 2019-07-13 (×2): qty 5

## 2019-07-13 MED ORDER — ZOLPIDEM TARTRATE 5 MG PO TABS: 5.0000 mg | ORAL_TABLET | Freq: Every evening | ORAL | Status: DC | PRN

## 2019-07-13 MED ORDER — LATANOPROST 0.005 % OP SOLN: 1.0000 [drp] | Freq: Every day | OPHTHALMIC | Status: DC

## 2019-07-13 MED ORDER — PHENYLEPHRINE 40 MCG/ML (10ML) SYRINGE FOR IV PUSH (FOR BLOOD PRESSURE SUPPORT)
PREFILLED_SYRINGE | INTRAVENOUS | Status: DC | PRN
  Administered 2019-07-13: 120 ug via INTRAVENOUS

## 2019-07-13 MED ORDER — BUPIVACAINE HCL (PF) 0.5 % IJ SOLN
INTRAMUSCULAR | Status: AC
  Filled 2019-07-13: qty 30

## 2019-07-13 MED ORDER — METHOCARBAMOL 500 MG PO TABS: 500.0000 mg | ORAL_TABLET | Freq: Four times a day (QID) | ORAL | Status: DC | PRN

## 2019-07-13 MED ORDER — HYDROCODONE-ACETAMINOPHEN 5-325 MG PO TABS: 1.0000 | ORAL_TABLET | ORAL | Status: DC | PRN

## 2019-07-13 MED ORDER — ONDANSETRON HCL 4 MG PO TABS: 4.0000 mg | ORAL_TABLET | Freq: Four times a day (QID) | ORAL | Status: DC | PRN

## 2019-07-13 MED ORDER — LIDOCAINE-EPINEPHRINE 1 %-1:100000 IJ SOLN
INTRAMUSCULAR | Status: AC
  Filled 2019-07-13: qty 1

## 2019-07-13 MED ORDER — ACETAMINOPHEN 10 MG/ML IV SOLN: 1000.0000 mg | Freq: Once | INTRAVENOUS | Status: DC | PRN

## 2019-07-13 MED ORDER — CHLORHEXIDINE GLUCONATE CLOTH 2 % EX PADS
6.0000 | MEDICATED_PAD | Freq: Every day | CUTANEOUS | Status: DC
  Administered 2019-07-13 – 2019-07-15 (×3): 6 via TOPICAL

## 2019-07-13 MED ORDER — LIDOCAINE-EPINEPHRINE 1 %-1:100000 IJ SOLN
INTRAMUSCULAR | Status: DC | PRN
  Administered 2019-07-13: 4 mL

## 2019-07-13 MED ORDER — COENZYME Q-10 200 MG PO CAPS: 200.0000 mg | ORAL_CAPSULE | Freq: Every day | ORAL | Status: DC

## 2019-07-13 MED ORDER — FENTANYL CITRATE (PF) 250 MCG/5ML IJ SOLN
INTRAMUSCULAR | Status: AC
  Filled 2019-07-13: qty 5

## 2019-07-13 MED ORDER — CEFAZOLIN SODIUM-DEXTROSE 2-3 GM-%(50ML) IV SOLR
INTRAVENOUS | Status: DC | PRN
  Administered 2019-07-13: 2 g via INTRAVENOUS

## 2019-07-13 MED ORDER — 0.9 % SODIUM CHLORIDE (POUR BTL) OPTIME
TOPICAL | Status: DC | PRN
  Administered 2019-07-13: 1000 mL

## 2019-07-13 MED ORDER — BISACODYL 10 MG RE SUPP: 10.0000 mg | Freq: Every day | RECTAL | Status: DC | PRN

## 2019-07-13 MED ORDER — LACTATED RINGERS IV SOLN
INTRAVENOUS | Status: DC
  Administered 2019-07-13 (×2): via INTRAVENOUS

## 2019-07-13 MED ORDER — ETOMIDATE 2 MG/ML IV SOLN
INTRAVENOUS | Status: DC | PRN
  Administered 2019-07-13: 20 mg via INTRAVENOUS

## 2019-07-13 MED ORDER — KETOROLAC TROMETHAMINE 0.5 % OP SOLN
1.0000 [drp] | Freq: Three times a day (TID) | OPHTHALMIC | Status: DC
  Administered 2019-07-14 – 2019-07-25 (×31): 1 [drp] via OPHTHALMIC
  Filled 2019-07-13 (×2): qty 5

## 2019-07-13 MED ORDER — PHENYLEPHRINE HCL-NACL 10-0.9 MG/250ML-% IV SOLN
INTRAVENOUS | Status: DC | PRN
  Administered 2019-07-13: 50 ug/min via INTRAVENOUS

## 2019-07-13 MED ORDER — SUCCINYLCHOLINE CHLORIDE 200 MG/10ML IV SOSY
PREFILLED_SYRINGE | INTRAVENOUS | Status: DC | PRN
  Administered 2019-07-13: 140 mg via INTRAVENOUS

## 2019-07-13 MED ORDER — PROPOFOL 10 MG/ML IV BOLUS
INTRAVENOUS | Status: AC
  Filled 2019-07-13: qty 20

## 2019-07-13 MED ORDER — ONDANSETRON HCL 4 MG/2ML IJ SOLN
INTRAMUSCULAR | Status: DC | PRN
  Administered 2019-07-13: 4 mg via INTRAVENOUS

## 2019-07-13 MED ORDER — MENTHOL 3 MG MT LOZG: 1.0000 | LOZENGE | OROMUCOSAL | Status: DC | PRN

## 2019-07-13 MED ORDER — THROMBIN 20000 UNITS EX SOLR
CUTANEOUS | Status: DC | PRN
  Administered 2019-07-13: 20 mL

## 2019-07-13 MED ORDER — DOCUSATE SODIUM 100 MG PO CAPS: 100.0000 mg | ORAL_CAPSULE | Freq: Two times a day (BID) | ORAL | Status: DC

## 2019-07-13 MED ORDER — ALBUMIN HUMAN 5 % IV SOLN
INTRAVENOUS | Status: DC | PRN
  Administered 2019-07-13: 14:00:00 via INTRAVENOUS

## 2019-07-13 MED ORDER — LATANOPROST 0.005 % OP SOLN
1.0000 [drp] | Freq: Every day | OPHTHALMIC | Status: DC
  Administered 2019-07-14 – 2019-07-24 (×11): 1 [drp] via OPHTHALMIC
  Filled 2019-07-13 (×2): qty 2.5

## 2019-07-13 MED ORDER — ACETAMINOPHEN 650 MG RE SUPP: 650.0000 mg | Freq: Four times a day (QID) | RECTAL | Status: DC | PRN

## 2019-07-13 MED ORDER — PHENOL 1.4 % MT LIQD: 1.0000 | OROMUCOSAL | Status: DC | PRN

## 2019-07-13 MED ORDER — SODIUM CHLORIDE 0.9 % IV SOLN
INTRAVENOUS | Status: DC | PRN
  Administered 2019-07-13: 21:00:00 500 mL via INTRAVENOUS
  Administered 2019-07-16: 1000 mL via INTRAVENOUS

## 2019-07-13 MED ORDER — FENTANYL CITRATE (PF) 250 MCG/5ML IJ SOLN
INTRAMUSCULAR | Status: DC | PRN
  Administered 2019-07-13: 100 ug via INTRAVENOUS
  Administered 2019-07-13 (×2): 50 ug via INTRAVENOUS

## 2019-07-13 MED ORDER — FENTANYL CITRATE (PF) 100 MCG/2ML IJ SOLN: 25.0000 ug | INTRAMUSCULAR | Status: DC | PRN

## 2019-07-13 MED ORDER — ONDANSETRON HCL 4 MG/2ML IJ SOLN: 4.0000 mg | Freq: Once | INTRAMUSCULAR | Status: DC | PRN

## 2019-07-13 MED ORDER — CALCIUM CARBONATE 1250 (500 CA) MG PO TABS
1.0000 | ORAL_TABLET | Freq: Every day | ORAL | Status: DC
  Filled 2019-07-13 (×5): qty 1

## 2019-07-13 MED ORDER — THROMBIN 5000 UNITS EX SOLR
CUTANEOUS | Status: AC
  Filled 2019-07-13: qty 10000

## 2019-07-13 MED ORDER — ACETAMINOPHEN 650 MG RE SUPP: 650.0000 mg | RECTAL | Status: DC | PRN

## 2019-07-13 MED ORDER — INSULIN GLARGINE 100 UNIT/ML ~~LOC~~ SOLN
10.0000 [IU] | Freq: Every day | SUBCUTANEOUS | Status: DC
  Administered 2019-07-15: 10 [IU] via SUBCUTANEOUS
  Filled 2019-07-13 (×4): qty 0.1

## 2019-07-13 MED ORDER — TIOTROPIUM BROMIDE MONOHYDRATE 2.5 MCG/ACT IN AERS: 2.0000 | INHALATION_SPRAY | RESPIRATORY_TRACT | Status: DC

## 2019-07-13 MED ORDER — SODIUM CHLORIDE 0.9 % IV SOLN
INTRAVENOUS | Status: DC | PRN
  Administered 2019-07-13: 500 mL

## 2019-07-13 MED ORDER — SENNA 8.6 MG PO TABS: 1.0000 | ORAL_TABLET | Freq: Two times a day (BID) | ORAL | Status: DC

## 2019-07-13 MED ORDER — ALLOPURINOL 100 MG PO TABS
100.0000 mg | ORAL_TABLET | Freq: Every day | ORAL | Status: DC
  Filled 2019-07-13 (×3): qty 1

## 2019-07-13 MED ORDER — MIDAZOLAM HCL 2 MG/2ML IJ SOLN
INTRAMUSCULAR | Status: AC
  Filled 2019-07-13: qty 2

## 2019-07-13 MED ORDER — INSULIN GLARGINE 100 UNIT/ML ~~LOC~~ SOLN
20.0000 [IU] | Freq: Every day | SUBCUTANEOUS | Status: DC
  Filled 2019-07-13 (×2): qty 0.2

## 2019-07-13 MED ORDER — UMECLIDINIUM BROMIDE 62.5 MCG/INH IN AEPB
1.0000 | INHALATION_SPRAY | Freq: Every day | RESPIRATORY_TRACT | Status: DC
  Administered 2019-07-14 – 2019-07-15 (×2): 1 via RESPIRATORY_TRACT
  Filled 2019-07-13: qty 7

## 2019-07-13 MED ORDER — ROCURONIUM BROMIDE 10 MG/ML (PF) SYRINGE
PREFILLED_SYRINGE | INTRAVENOUS | Status: DC | PRN
  Administered 2019-07-13 (×2): 20 mg via INTRAVENOUS
  Administered 2019-07-13: 60 mg via INTRAVENOUS

## 2019-07-13 MED ORDER — SODIUM CHLORIDE 0.9 % IV SOLN
500.0000 mg | INTRAVENOUS | Status: DC
  Administered 2019-07-14 – 2019-07-18 (×5): 500 mg via INTRAVENOUS
  Filled 2019-07-13 (×5): qty 500

## 2019-07-13 MED ORDER — ACETAMINOPHEN 325 MG PO TABS: 650.0000 mg | ORAL_TABLET | ORAL | Status: DC | PRN

## 2019-07-13 MED ORDER — METOPROLOL SUCCINATE ER 25 MG PO TB24
ORAL_TABLET | ORAL | Status: AC
  Filled 2019-07-13: qty 1

## 2019-07-13 MED ORDER — LIDOCAINE 2% (20 MG/ML) 5 ML SYRINGE
INTRAMUSCULAR | Status: DC | PRN
  Administered 2019-07-13: 80 mg via INTRAVENOUS

## 2019-07-13 MED ORDER — HYDROMORPHONE HCL 1 MG/ML IJ SOLN
1.0000 mg | INTRAMUSCULAR | Status: DC | PRN
  Administered 2019-07-13 (×2): 1 mg via INTRAVENOUS
  Filled 2019-07-13 (×2): qty 1

## 2019-07-13 MED ORDER — ACETAMINOPHEN 325 MG PO TABS: 650.0000 mg | ORAL_TABLET | Freq: Four times a day (QID) | ORAL | Status: DC | PRN

## 2019-07-13 MED ORDER — BUPIVACAINE HCL 0.5 % IJ SOLN
INTRAMUSCULAR | Status: DC | PRN
  Administered 2019-07-13: 4 mL

## 2019-07-13 MED ORDER — FLEET ENEMA 7-19 GM/118ML RE ENEM
1.0000 | ENEMA | Freq: Once | RECTAL | Status: AC | PRN
  Administered 2019-07-19: 18:00:00 1 via RECTAL
  Filled 2019-07-13: qty 1

## 2019-07-13 MED ORDER — HYDROCODONE-ACETAMINOPHEN 5-325 MG PO TABS
1.0000 | ORAL_TABLET | Freq: Four times a day (QID) | ORAL | Status: DC | PRN
  Administered 2019-07-13: 1 via ORAL
  Filled 2019-07-13: qty 1

## 2019-07-13 MED ORDER — THROMBIN 20000 UNITS EX SOLR
CUTANEOUS | Status: AC
  Filled 2019-07-13: qty 20000

## 2019-07-13 MED ORDER — INSULIN ASPART 100 UNIT/ML ~~LOC~~ SOLN
0.0000 [IU] | Freq: Three times a day (TID) | SUBCUTANEOUS | Status: DC
  Administered 2019-07-15 (×2): 1 [IU] via SUBCUTANEOUS

## 2019-07-13 MED ORDER — ATORVASTATIN CALCIUM 10 MG PO TABS: 10.0000 mg | ORAL_TABLET | Freq: Every day | ORAL | Status: DC

## 2019-07-13 MED ORDER — ACETAMINOPHEN 500 MG PO TABS
1000.0000 mg | ORAL_TABLET | Freq: Four times a day (QID) | ORAL | Status: AC
  Filled 2019-07-13: qty 2

## 2019-07-13 MED ORDER — SODIUM CHLORIDE 0.9 % IV SOLN
1.0000 g | INTRAVENOUS | Status: AC
  Administered 2019-07-13 – 2019-07-18 (×6): 1 g via INTRAVENOUS
  Filled 2019-07-13: qty 10
  Filled 2019-07-13 (×5): qty 1

## 2019-07-13 MED ORDER — DORZOLAMIDE HCL 2 % OP SOLN
1.0000 [drp] | OPHTHALMIC | Status: DC
  Administered 2019-07-14 – 2019-07-25 (×11): 1 [drp] via OPHTHALMIC
  Filled 2019-07-13 (×3): qty 10

## 2019-07-13 MED ORDER — THROMBIN 5000 UNITS EX SOLR
OROMUCOSAL | Status: DC | PRN
  Administered 2019-07-13: 5 mL
  Administered 2019-07-13: 15:00:00 via TOPICAL

## 2019-07-13 MED ORDER — MOMETASONE FURO-FORMOTEROL FUM 200-5 MCG/ACT IN AERO
2.0000 | INHALATION_SPRAY | Freq: Two times a day (BID) | RESPIRATORY_TRACT | Status: DC
  Administered 2019-07-14 – 2019-07-15 (×3): 2 via RESPIRATORY_TRACT
  Filled 2019-07-13: qty 8.8

## 2019-07-13 MED ORDER — EPHEDRINE SULFATE-NACL 50-0.9 MG/10ML-% IV SOSY
PREFILLED_SYRINGE | INTRAVENOUS | Status: DC | PRN
  Administered 2019-07-13 (×2): 15 mg via INTRAVENOUS

## 2019-07-13 MED ORDER — METOPROLOL SUCCINATE ER 50 MG PO TB24
50.0000 mg | ORAL_TABLET | Freq: Every day | ORAL | Status: DC
  Administered 2019-07-13: 50 mg via ORAL
  Filled 2019-07-13: qty 1

## 2019-07-13 MED ORDER — SENNOSIDES-DOCUSATE SODIUM 8.6-50 MG PO TABS: 1.0000 | ORAL_TABLET | Freq: Every evening | ORAL | Status: DC | PRN

## 2019-07-13 MED ORDER — OXYCODONE HCL 5 MG PO TABS: 5.0000 mg | ORAL_TABLET | ORAL | Status: DC | PRN

## 2019-07-13 MED ORDER — ONDANSETRON HCL 4 MG/2ML IJ SOLN
4.0000 mg | Freq: Four times a day (QID) | INTRAMUSCULAR | Status: DC | PRN
  Administered 2019-07-17: 4 mg via INTRAVENOUS
  Filled 2019-07-13: qty 2

## 2019-07-13 MED ORDER — VITAMIN D 25 MCG (1000 UNIT) PO TABS: 5000.0000 [IU] | ORAL_TABLET | Freq: Every day | ORAL | Status: DC

## 2019-07-13 MED ORDER — HYDROMORPHONE HCL 1 MG/ML IJ SOLN
0.5000 mg | INTRAMUSCULAR | Status: DC | PRN
  Administered 2019-07-13 – 2019-07-14 (×2): 1 mg via INTRAVENOUS
  Filled 2019-07-13 (×2): qty 1

## 2019-07-13 SURGICAL SUPPLY — 60 items
BAG DECANTER FOR FLEXI CONT (MISCELLANEOUS) ×3 IMPLANT
BAND RUBBER #18 3X1/16 STRL (MISCELLANEOUS) ×6 IMPLANT
BENZOIN TINCTURE PRP APPL 2/3 (GAUZE/BANDAGES/DRESSINGS) IMPLANT
BLADE CLIPPER SURG (BLADE) IMPLANT
BLADE SURG 11 STRL SS (BLADE) ×3 IMPLANT
BLADE ULTRA TIP 2M (BLADE) IMPLANT
BUR MATCHSTICK NEURO 3.0 LAGG (BURR) ×5 IMPLANT
CAGE ENDOSKELETON TC 14X12X5 (Cage) ×2 IMPLANT
CAGE LORDOTIC 6 SM (Cage) ×2 IMPLANT
CAGE LORDOTIC 6MM SM (Cage) ×2 IMPLANT
CANISTER SUCT 3000ML PPV (MISCELLANEOUS) ×3 IMPLANT
CARTRIDGE OIL MAESTRO DRILL (MISCELLANEOUS) ×1 IMPLANT
CLOSURE WOUND 1/2 X4 (GAUZE/BANDAGES/DRESSINGS)
DECANTER SPIKE VIAL GLASS SM (MISCELLANEOUS) ×3 IMPLANT
DERMABOND ADVANCED (GAUZE/BANDAGES/DRESSINGS) ×2
DERMABOND ADVANCED .7 DNX12 (GAUZE/BANDAGES/DRESSINGS) ×1 IMPLANT
DIFFUSER DRILL AIR PNEUMATIC (MISCELLANEOUS) ×3 IMPLANT
DRAIN CHANNEL 10M FLAT 3/4 FLT (DRAIN) IMPLANT
DRAPE C-ARM 42X72 X-RAY (DRAPES) ×6 IMPLANT
DRAPE HALF SHEET 40X57 (DRAPES) IMPLANT
DRAPE LAPAROTOMY 100X72 PEDS (DRAPES) ×3 IMPLANT
DRAPE MICROSCOPE LEICA (MISCELLANEOUS) ×3 IMPLANT
DRSG OPSITE POSTOP 3X4 (GAUZE/BANDAGES/DRESSINGS) ×4 IMPLANT
DURAPREP 6ML APPLICATOR 50/CS (WOUND CARE) ×3 IMPLANT
ELECT COATED BLADE 2.86 ST (ELECTRODE) ×3 IMPLANT
EVACUATOR SILICONE 100CC (DRAIN) IMPLANT
GAUZE 4X4 16PLY RFD (DISPOSABLE) IMPLANT
GLOVE BIO SURGEON STRL SZ7.5 (GLOVE) ×4 IMPLANT
GLOVE BIOGEL PI IND STRL 7.5 (GLOVE) ×2 IMPLANT
GLOVE BIOGEL PI INDICATOR 7.5 (GLOVE) ×4
GLOVE ECLIPSE 7.0 STRL STRAW (GLOVE) ×5 IMPLANT
GOWN STRL REUS W/ TWL LRG LVL3 (GOWN DISPOSABLE) ×2 IMPLANT
GOWN STRL REUS W/ TWL XL LVL3 (GOWN DISPOSABLE) IMPLANT
GOWN STRL REUS W/TWL 2XL LVL3 (GOWN DISPOSABLE) IMPLANT
GOWN STRL REUS W/TWL LRG LVL3 (GOWN DISPOSABLE) ×10
GOWN STRL REUS W/TWL XL LVL3 (GOWN DISPOSABLE)
GRAFT DURAGEN MATRIX 1WX1L (Tissue) ×2 IMPLANT
HEMOSTAT POWDER KIT SURGIFOAM (HEMOSTASIS) ×5 IMPLANT
KIT BASIN OR (CUSTOM PROCEDURE TRAY) ×3 IMPLANT
KIT TURNOVER KIT B (KITS) ×3 IMPLANT
NDL SPNL 22GX3.5 QUINCKE BK (NEEDLE) ×1 IMPLANT
NEEDLE HYPO 22GX1.5 SAFETY (NEEDLE) ×3 IMPLANT
NEEDLE SPNL 22GX3.5 QUINCKE BK (NEEDLE) ×3 IMPLANT
NS IRRIG 1000ML POUR BTL (IV SOLUTION) ×3 IMPLANT
OIL CARTRIDGE MAESTRO DRILL (MISCELLANEOUS) ×3
PACK LAMINECTOMY NEURO (CUSTOM PROCEDURE TRAY) ×3 IMPLANT
PAD ARMBOARD 7.5X6 YLW CONV (MISCELLANEOUS) ×9 IMPLANT
PLATE 3 57.5XLCK NS SPNE CVD (Plate) IMPLANT
PLATE 3 ATLANTIS TRANS (Plate) ×2 IMPLANT
PUTTY DBF 3CC CORTICAL FIBERS (Putty) ×2 IMPLANT
SCREW SELF TAP VAR 4.0X13 (Screw) ×16 IMPLANT
SPONGE INTESTINAL PEANUT (DISPOSABLE) ×3 IMPLANT
SPONGE SURGIFOAM ABS GEL 100 (HEMOSTASIS) ×3 IMPLANT
STRIP CLOSURE SKIN 1/2X4 (GAUZE/BANDAGES/DRESSINGS) IMPLANT
SUT VIC AB 3-0 SH 8-18 (SUTURE) ×3 IMPLANT
SUT VICRYL 3-0 RB1 18 ABS (SUTURE) ×3 IMPLANT
TAPE CLOTH 3X10 TAN LF (GAUZE/BANDAGES/DRESSINGS) ×3 IMPLANT
TOWEL GREEN STERILE (TOWEL DISPOSABLE) ×3 IMPLANT
TOWEL GREEN STERILE FF (TOWEL DISPOSABLE) ×3 IMPLANT
WATER STERILE IRR 1000ML POUR (IV SOLUTION) ×3 IMPLANT

## 2019-07-13 NOTE — Progress Notes (Signed)
Per Dr. Kathyrn Sheriff, the patient does not need to go to CT for his lumbar scan prior to his neck surgery.

## 2019-07-13 NOTE — ED Notes (Signed)
Per orders, Called ortho tech for [employer] collar and they do not have them. Told to look in trauma pyxis or may have to order one from materials.

## 2019-07-13 NOTE — ED Notes (Signed)
Pt refused Tylenol for pain, st's he wants Dilaudid 1mg  or Oxycodone.  Pt also refused Lantus and eye drops.  Admitting MD paged to make aware of same

## 2019-07-13 NOTE — ED Notes (Signed)
Patient transported to X-ray 

## 2019-07-13 NOTE — Progress Notes (Signed)
Progress Note    [name]  SNK:[id number] DOB: 08/22/1940  DOA: 08/11/2019 PCP: No primary care provider on file.    Brief Narrative:    Medical records reviewed and are as summarized below:  Lucas Price is an 78 y.o. male with medical history significant of CAD, COPD, CAD, diabetes mellitus, GERD, hypertension, recurrent UTIs, sleep apnea on BiPAP, gout presenting with complaints of weakness and a fall.  Patient states last night while climbing stairs his legs gave out and he fell and hit his head.  He is complaining of severe pain in his bilateral upper extremities.  Reports having chronic lower back pain secondary to degenerative disc disease.  Denies saddle anesthesia, urinary or fecal incontinence.  Reports having dyspnea even with minimal exertion which he feels is chronic and not significantly changed from his baseline.  He has COPD and uses home oxygen only as needed.    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 Significant prevertebral soft tissue edema and fluid is seen extending from C2 through C6. Marrow edema/osseous contusions involving the C5-C7 anterior vertebral bodies. Interspinous ligamentous injury at C5-C6. Findings suggestive of acute cord contusion seen at C5 with cord edema. No definite epidural hematoma or hemorrhage. 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 -need to be addressed operatively, neurosurgery following -Maintain Aspen c-collar at all times -Surgical planning to be determined by Dr. Kathyrn Sheriff -Keep n.p.o. -Dilaudid as needed for pain  T12-L1 disc space fracture MRI of lumbar spine showing acute fracture through ankylosed spine at the T12-L1 disc space. Disrupted anterior longitudinal ligament, disc, and propagation of the fracture through the posterior elements including the L1 spinous process.  -Unstable fracture needs to be addressed surgically.  Neurosurgery following. -Strict bedrest at all times, HOB max 15 degrees, logroll only -Surgical planning to be determined by Dr. Kathyrn Sheriff -Keep n.p.o. -Dilaudid as needed for pain  History of CAD Given history of prior abnormal stress test, neurosurgery requesting cardiac clearance prior to surgery.   -EKG currently not suggestive of ACS.   -have placed cardiology consult but this should not delay his surgery-- Dr. Einar Gip to see -Continue beta-blocker and statin  Bladder distention on CT -Check postvoid residual volume/PRN bladder scan.  If signs of acute urinary retention, Place Foley.  Acute on chronic hypoxic respiratory failure secondary to multifocal pneumonia- h/o Chronic respiratory failure with hypoxia (HCC)- wears O2 at night with BIPAP (follows with Dr. Chase Caller)  -Oxygen saturation in the 80s on room air, satting well on 2 L supplemental oxygen.  Afebrile.  White blood cell count 20.3 with left shift.  Chest CT showing new groundglass patchy airspace opacities within the left upper lobe and throughout the right lung predominantly within the periphery.  There is also streaky areas of consolidation in the posterior lower lungs.  Findings concerning for viral/atypical pneumonia.  SARS-CoV-2 test negative. -Ceftriaxone and azithromycin -  procalcitonin level pending -Check inflammatory markers per Dr. Marlowe Sax -Continuous pulse  ox -Supplemental oxygen  CKD stage IIIb -Stable.  Creatinine at baseline.  Well-controlled insulin-dependent diabetes mellitus A1c 5.1 on 10/19. -Takes Lantus 20 units at bedtime.  Reduce dose to 10 units as currently n.p.o. -Sliding scale insulin sensitive and CBG checks   COPD -Stable.  - No cough or wheezing.   -Continue home inhalers. -PRN O2  obesity Body mass index is 31.53 kg/m.   Will transfer to progressive level of care as recommended by Mariana Single.   Family Communication/Anticipated D/C date and plan/Code Status   DVT prophylaxis: scd Code Status: Full Code.  Family Communication:  Disposition Plan: plan per NS   Medical Consultants:    NS  cardiology     Subjective:   C/o pain Able to walk and climb stairs prior-- limited by dyspnea not chest discomfort   Objective:    Vitals:   07/28/2019 0000 07/19/2019 0030 07/29/2019 0130 07/25/2019 0656  BP: (!) 124/58 (!) 117/54 (!) 117/57 (!) 111/58  Pulse: 74 60 61 62  Resp: (!) 21 20 (!) 23 20  Temp:    (!) 97.2 F (36.2 C)  TempSrc:    Axillary  SpO2: 96% 100% 94%   Weight:      Height:        Intake/Output Summary (Last 24 hours) at 07/31/2019 0845 Last data filed at 07/24/2019 0530 Gross per 24 hour  Intake 100 ml  Output --  Net 100 ml   Filed Weights   08/06/2019 1901  Weight: 80.7 kg    Exam: In bed, wearing aspen collar Appears uncomfortable rrr Alert and oriented +BS, soft, NT  Data Reviewed:   I have personally reviewed following labs and imaging studies:  Labs: Labs show the following:   Basic Metabolic Panel: Recent Labs  Lab 07/21/2019 2125  NA 135  K 4.6  CL 105  CO2 18*  GLUCOSE 161*  BUN 51*  CREATININE 1.82*  CALCIUM 8.8*   GFR Estimated Creatinine Clearance: 32.4 mL/min (A) (by C-G formula based on SCr of 1.82 mg/dL (H)). Liver Function Tests: Recent Labs  Lab 07/31/2019 2125  AST 47*  ALT 40  ALKPHOS 73  BILITOT 0.9  PROT 7.1  ALBUMIN 3.1*    No results for input(s): LIPASE, AMYLASE in the last 168 hours. No results for input(s): AMMONIA in the last 168 hours. Coagulation profile No results for input(s): INR, PROTIME in the last 168 hours.  CBC: Recent Labs  Lab 08/04/2019 2125  WBC 20.3*  NEUTROABS 17.9*  HGB 12.9*  HCT 40.4  MCV 98.3  PLT 152   Cardiac Enzymes: No results for input(s): CKTOTAL, CKMB, CKMBINDEX, TROPONINI in the last 168 hours. BNP (last 3 results) No results for input(s): PROBNP in the last 8760 hours. CBG: No results for input(s): GLUCAP in the last 168 hours. D-Dimer: No results for input(s): DDIMER in the last 72 hours. Hgb A1c: No results for input(s): HGBA1C in the last 72 hours. Lipid Profile: No results for input(s): CHOL, HDL, LDLCALC, TRIG, CHOLHDL, LDLDIRECT in the last 72 hours. Thyroid function studies: No results for input(s): TSH, T4TOTAL, T3FREE, THYROIDAB in the last 72 hours.  Invalid input(s): FREET3 Anemia work up: No results for input(s): VITAMINB12, FOLATE, FERRITIN, TIBC, IRON, RETICCTPCT in the last 72 hours. Sepsis Labs: Recent Labs  Lab 07/21/2019 2125  WBC 20.3*    Microbiology Recent Results (from the past 240 hour(s))  SARS CORONAVIRUS 2 (  TAT 6-24 HRS) Nasopharyngeal Nasopharyngeal Swab     Status: None   Collection Time: 07/29/2019 11:00 PM   Specimen: Nasopharyngeal Swab  Result Value Ref Range Status   SARS Coronavirus 2 NEGATIVE NEGATIVE Final    Comment: (NOTE) SARS-CoV-2 target nucleic acids are NOT DETECTED. The SARS-CoV-2 RNA is generally detectable in upper and lower respiratory specimens during the acute phase of infection. Negative results do not preclude SARS-CoV-2 infection, do not rule out co-infections with other pathogens, and should not be used as the sole basis for treatment or other patient management decisions. Negative results must be combined with clinical observations, patient history, and epidemiological information. The  expected result is Negative. Fact Sheet for Patients: SugarRoll.be Fact Sheet for Healthcare Providers: https://www.woods-mathews.com/ This test is not yet approved or cleared by the [place] FDA and  has been authorized for detection and/or diagnosis of SARS-CoV-2 by FDA under an Emergency Use Authorization (EUA). This EUA will remain  in effect (meaning this test can be used) for the duration of the COVID-19 declaration under Section 56 4(b)(1) of the Act, 21 U.S.C. section 360bbb-3(b)(1), unless the authorization is terminated or revoked sooner. Performed at [facility], [employer] [address], [place], [place] [address]     Procedures and diagnostic studies:  Ct Head Wo Contrast  Result Date: 07/22/2019 CLINICAL DATA:  78 year old male with ataxia status post fall backwards. EXAM: CT HEAD WITHOUT CONTRAST TECHNIQUE: Contiguous axial images were obtained from the base of the skull through the vertex without intravenous contrast. COMPARISON:  MiniCAT CT paranasal sinuses 01/12/2018. FINDINGS: Brain: There is disproportionate biparietal cerebral volume loss (series 6, image 37), but elsewhere cerebral volume appears normal for age. No cortical encephalomalacia identified. No midline shift, ventriculomegaly, mass effect, evidence of mass lesion, intracranial hemorrhage or evidence of cortically based acute infarction. Gray-white matter differentiation is within normal limits throughout the brain. Vascular: Calcified atherosclerosis at the skull base. No suspicious intracranial vascular hyperdensity. Skull: No acute osseous abnormality identified. Incidental prominent occipital arachnoid granulations on series 4, image 17 (normal variant). Sinuses/Orbits: Visualized paranasal sinuses and mastoids are well pneumatized today. Other: Mild posterior vertex scalp hematoma suspected (series 6, image 29). Underlying calvarium intact. Otherwise negative orbit  and scalp soft tissues. IMPRESSION: 1. Mild posterior vertex scalp soft tissue injury without underlying fracture. 2.  No acute traumatic injury to the brain identified. 3. Suggestion of disproportionate biparietal cerebral volume loss, non-specific but can be seen with dementia. Electronically Signed   By: Genevie Ann M.D.   On: 07/24/2019 22:40   Ct Chest Wo Contrast  Result Date: 07/30/2019 CLINICAL DATA:  Shortness of breath EXAM: CT CHEST WITHOUT CONTRAST TECHNIQUE: Multidetector CT imaging of the chest was performed following the standard protocol without IV contrast. COMPARISON:  April 20, 2018 FINDINGS: Cardiovascular: Coronary artery calcifications are seen. Scattered aortic atherosclerosis. The heart size is normal. There is no pericardial thickening or effusion. Mediastinum/Nodes: Again noted are scattered mediastinal, pretracheal and prevascular lymph nodes seen. The largest within the anterior subcarinal region measuring 1.3 cm as on the prior exam. The thyroid gland, trachea and esophagus demonstrate no significant findings. Lungs/Pleura: There is new ground-glass patchy airspace opacities within the left upper lobe and throughout the right lung, predominantly within the periphery. There is also new patchy areas of consolidation within the posterior bilateral lower lungs, right greater than left. There is unchanged nodular area of streaky opacity again noted at the left lung base and along the right middle lobe.  There is no pleural effusion. Upper abdomen: The visualized portion of the upper abdomen is unremarkable. Musculoskeletal/Chest wall: There is widening of the anterior inter disc space at T12-L1 which is partially visualized with linear calcifications in the inter disc space. This was not seen on the prior exam and could be due to fracture of the anterior flowing osteophyte. Again noted are anterior flowing osteophytes seen throughout the thoracic spine. IMPRESSION: 1. New ground-glass  patchy airspace opacities seen within the left upper lobe and throughout the right lung predominantly within the periphery. There is also streaky areas of consolidation in the posterior lower lungs. This is nonspecific, however could be due to atypical viral pneumonia. 2. Unchanged patchy rounded airspace nodules within the left lower lung base and right middle lobe as on prior CT dating back to 2019. 3. Unchanged scattered mediastinal lymph nodes. 4. New age-indeterminate widening of the anterior interdisc space at T12-L1 which is partially visualized and concerning for fracture of the anterior flowing osteophyte. Would correlate with patient's site of pain and further evaluation is required, would recommend dedicated MRI Electronically Signed   By: Prudencio Pair M.D.   On: 08/10/2019 02:33   Ct Cervical Spine Wo Contrast  Result Date: 07/25/2019 CLINICAL DATA:  78 year old male with ataxia status post fall backwards. EXAM: CT CERVICAL SPINE WITHOUT CONTRAST TECHNIQUE: Multidetector CT imaging of the cervical spine was performed without intravenous contrast. Multiplanar CT image reconstructions were also generated. COMPARISON:  Head CT today reported separately. FINDINGS: Alignment: Reversal of lower cervical lordosis. Cervicothoracic junction alignment is within normal limits. Bilateral posterior element alignment is within normal limits. Skull base and vertebrae: Visualized skull base is intact. No atlanto-occipital dissociation. Incidental prominent occipital bone arachnoid granulations (normal variant). No acute osseous abnormality identified. Soft tissues and spinal canal: No visible canal hematoma. There is prevertebral fluid or edema at the C3 level demonstrated on series 9, image 118. No adjacent vertebral fracture identified. Otherwise negative visible noncontrast neck soft tissues. Disc levels: There appears to be significant degenerative cervical spinal stenosis at most levels, including at least  moderate suspected stenosis at C2-C3 (series 9, image 100). Lower cervical spinal stenosis is related to bulky disc osteophyte complex. Upper chest: T2 through T4 upper thoracic ankylosis. Grossly intact visible upper thoracic levels. Osteopenia. There is patchy nonspecific ground-glass opacity in the visible right upper lobe (series 5, images 87 and 103). Negative visible left lung apex. Negative visible noncontrast superior mediastinum. IMPRESSION: 1. No cervical spine fracture is identified, but mild prevertebral fluid raises the possibility of anterior ligamentous injury at C3. A noncontrast Cervical Spine MRI could evaluate further. 2. 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 This could predispose to blunt trauma to the cervical spinal cord in the setting of fall. 3. Nonspecific pulmonary ground-glass opacity in the right upper lobe. Consider acute viral/atypical respiratory infection. 4. 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 Electronically Signed   By: Genevie Ann M.D.   On: 07/20/2019 22:49   Mr Cervical Spine Wo Contrast  Result Date: 07/20/2019 CLINICAL DATA:  Weakness, fall hit head EXAM: MRI CERVICAL SPINE WITHOUT CONTRAST TECHNIQUE: Multiplanar, multisequence MR imaging of the cervical spine was performed. No intravenous contrast was administered. COMPARISON:  None. FINDINGS: Alignment: There is reversal of the normal cervical lordosis. A grade 1 anterolisthesis of C4 on C5 is seen measuring 3 mm. Vertebrae: There is a focal disruption of the anterior longitudinal ligament seen at C5-C6 with fluid between the cleft.  There is also a probable acutely fractured anterior osteophyte at the anterior inferior endplate of C5. Increased marrow signal seen at the anterior vertebral bodies of C5, C6, C7. There is increased signal seen between the inter spinous ligament at C5-C6 with surrounding paraspinal soft tissue edema. Cord: There is increased cord  signal seen at the C5 level with mild cord expansion, likely acute cord contusion. No definite blood products seen within the cord. There is increased cord signal seen from C6 through C7 which is likely due to myelomalacia. There is also minimally increased cord signal seen at C2-C3, likely due to early myelomalacia. Posterior Fossa, vertebral arteries, paraspinal tissues: Significant prevertebral fluid and soft tissue edema is noted from C2 through C6. There is also paraspinal soft tissue edema seen posteriorly. The visualized portion of the posterior fossa is unremarkable. Normal flow voids seen within the vertebral arteries. Disc levels: C1-C2: Atlanto-axial junction is normal, without canal narrowing C2-C3: There is a disc osteophyte complex and uncovertebral osteophytes which causes moderate bilateral neural foraminal narrowing. The central thecal sac measures 4 mm in AP diameter. C3-C4: There is a disc osteophyte complex and uncovertebral osteophytes which causes severe left and moderate right neural foraminal narrowing. The central thecal sac is effaced measuring 5 mm in AP diameter. C4-C5: There is a disc osteophyte complex and uncovertebral osteophytes which causes moderate bilateral neural foraminal narrowing. The central thecal sac is effaced measuring 6 mm in AP diameter. C5-C6: There is a disc osteophyte complex which is eccentric to the left with uncovertebral osteophytes. Severe bilateral neural foraminal narrowing is seen. There is effacement of the thecal sac which measures 3 mm in AP diameter. C6-C7: There is disc osteophyte complex and uncovertebral osteophytes which causes severe bilateral neural foraminal narrowing. There is effacement of the thecal sac which measures 4 mm in AP diameter. C7-T1: There is a disc osteophyte complex with a central disc protrusion and uncovertebral osteophytes which causes severe right and moderate to severe left neural foraminal narrowing. Central thecal sac is  effaced measuring 5 mm in AP diameter. IMPRESSION: 1. Acute disruption of the anterior longitudinal ligament at C5-C6 with a acute fracture of the anterior inferior endplate of the C5 osteophyte . Significant prevertebral soft tissue edema and fluid is seen extending from C2 through C6. 2. Marrow edema/osseous contusions involving the C5-C7 anterior vertebral bodies. 3. interspinous ligamentous injury at C5-C6. 4. Findings suggestive of acute cord contusion seen at C5 with cord edema. No definite epidural hematoma or hemorrhage. 5. 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 6. 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 These results were called by telephone at the time of interpretation on 08/05/2019 at 3:39am to provider [facility] , who verbally acknowledged these results. Electronically Signed   By: Prudencio Pair M.D.   On: 08/05/2019 03:39   Mr Lumbar Spine Wo Contrast  Addendum Date: 07/25/2019   ADDENDUM REPORT: 07/12/2019 03:46 ADDENDUM: Study discussed by telephone with Dr. Shela Leff on 07/20/2019 at 0340 hours. Electronically Signed   By: Genevie Ann M.D.   On: 08/11/2019 03:46   Result Date: 08/05/2019 CLINICAL DATA:  78 year old male with widespread thoracic spine ankylosis status post fall. Suspected injury through the disc space at T11-T12 on chest CT today. EXAM: MRI LUMBAR SPINE WITHOUT CONTRAST TECHNIQUE: Multiplanar, multisequence MR imaging of the lumbar spine was performed. No intravenous contrast was administered.  COMPARISON:  Chest CT earlier today. FINDINGS: Segmentation: In conjunction with the chest CT today, the L5 level is sacralized with a vestigial L5-S1 disc space. Correlation with radiographs is recommended prior to any operative intervention. Alignment: Straightening of lumbar lordosis. Mild focal lordosis at T12-L1. Vertebrae:  Widespread thoracic spine ankylosis which likely continued through the T12-L1 disc space. However, there is injury through that disc space and distraction anteriorly resulting in mild lordosis (series 6, image 9). Disrupted anterior longitudinal ligament. Posttraumatic signal changes in the disc. There is an associated nondisplaced fracture through the L1 spinous process (same image). And possibly a fracture also of the right T12 lamina/facet (series 7, image 7). In addition, there is mild compression of the T12 and L1 endplates with mild marrow edema, more pronounced along the right lateral L1 superior endplate. Normal background bone marrow signal. The L2 through L4 levels appear to remain intact. Sacralized L5. Intact visible sacrum and SI joints. Conus medullaris and cauda equina: Conus extends to the L1 level. No lower spinal cord or conus signal abnormality. Paraspinal and other soft tissues: There is edema tracking along the right chorea of the diaphragm and lateral to the right psoas muscle. Less pronounced left-side paraspinal soft tissue edema. There is mild posterior paraspinal muscle injury at the L1 level greater on the left. Additionally, there is probably a posttraumatic hematoma tracking in the lateral fibers of the right lumbar paraspinal muscles at L2 and L3 (series 8, image 18). This is not included on the sagittal images. Partially visible distended urinary bladder. Disc levels: Ordinary although advanced lumbar spine disc and endplate degeneration. Severe disc space loss with bulky disc osteophyte complex throughout the lumbar spine. Subsequent -Severe right lateral recess stenosis at L2-L3 (series 8, image 18, descending right L3 nerve level). -moderate spinal and severe bilateral lateral recess stenosis at L3-L4 (descending L4 nerve levels). -severe right greater than left lateral recess stenosis at L4-L5 (series 8, image 27) with mild spinal stenosis (L5 nerve levels). IMPRESSION: 1. Acute  fracture through ankylosed spine at the T12-L1 disc space. Disrupted anterior longitudinal ligament, disc, and propagation of the fracture through the posterior elements including the L1 spinous process. This is most likely an unstable injury. Recommend Neurosurgery consultation. 2. Associated paraspinal muscle injury, including a small intramuscular hematoma suspected along the lateral right erector spinae fibers at L2-L3. 3. Transitional lumbosacral anatomy, with sacralized L5 level and vestigial L5-S1 disc space. 4. Superimposed lumbar spine degeneration: advanced lumbar disc and endplate disease with multilevel severe lateral recess stenosis, moderate L3-L4 spinal stenosis. 5. Distended urinary bladder. Electronically Signed: By: Genevie Ann M.D. On: 07/27/2019 03:23    Medications:    allopurinol  100 mg Oral Daily   atorvastatin  10 mg Oral Daily   calcium carbonate  1 tablet Oral Daily   cholecalciferol  5,000 Units Oral Daily   dorzolamide  1 drop Both Eyes BH-q7a   insulin aspart  0-9 Units Subcutaneous TID WC   insulin glargine  10 Units Subcutaneous QHS   ketorolac  1 drop Both Eyes TID   latanoprost  1 drop Both Eyes QHS   metoprolol succinate  50 mg Oral Daily   mometasone-formoterol  2 puff Inhalation BID   Tiotropium Bromide Monohydrate  2 puff Inhalation BH-q7a   Continuous Infusions:  azithromycin     cefTRIAXone (ROCEPHIN)  IV Stopped (07/22/2019 0530)     LOS: 0 days   Geradine Girt  [facility]   How to contact  the [facility] Attending or Consulting provider [place] or covering provider during after hours [place], for this patient?  1. Check the care team in [facility] and look for a) attending/consulting [facility] provider listed and b) the [facility] team listed 2. Log into [email or web address] and use [place]'s universal password to access. If you do not have the password, please contact the hospital operator. 3. Locate the [facility] provider you are looking for under [facility] and page to a number that you can be directly reached. 4. If you still have difficulty reaching the provider, please page the [facility] (Director on Call) for the Hospitalists listed on amion for assistance.  07/25/2019, 8:45 AM

## 2019-07-13 NOTE — Transfer of Care (Signed)
Immediate Anesthesia Transfer of Care Note  Patient: Lucas Price  Procedure(s) Performed: ANTERIOR CERVICAL DECOMPRESSION/DISCECTOMY FUSION CERVICAL FIVE-SIX, CERVICAL SIX-SEVEN, CERVICAL SEVEN-THORACIC ONE (N/A Spine Cervical)  Patient Location: PACU  Anesthesia Type:General  Level of Consciousness: drowsy, patient cooperative and responds to stimulation  Airway & Oxygen Therapy: Patient Spontanous Breathing and Patient connected to nasal cannula oxygen  Post-op Assessment: Report given to RN and Post -op Vital signs reviewed and stable  Post vital signs: Reviewed and stable  Last Vitals:  Vitals Value Taken Time  BP    Temp    Pulse    Resp    SpO2      Last Pain:  Vitals:   08/10/2019 0700  TempSrc:   PainSc: 0-No pain         Complications: No apparent anesthesia complications

## 2019-07-13 NOTE — Consult Note (Signed)
CARDIOLOGY CONSULT NOTE  Patient ID: Lucas Price MRN: [id number] DOB/AGE: 76-Oct-1942 76 y.o.  Admit date: 07/19/2019 Referring Physician  Eulogio Bear, DO Primary Physician:  No primary care provider on file. Reason for Consultation  Pre op evaluation  HPI:    Lucas Price  is a 78 y.o. male  with Asian [place] male with underlying COPD, prior tobacco use disorder, history of bronchial asthma and has only less than 7-10-pack-year history of smoking, mild obesity, diabetes mellitus with stage IIIb chronic kidney disease, hypertension and hyperlipidemia admitted to the hospital after a fall and cervical neck fracture with bilateral lower extremity weakness.  I was asked on a urgent basis to evaluate the patient for preoperative cardiovascular stratification.  Patient has chronic dyspnea on exertion, has markedly limited activity and able to only perform activities of daily living due to marked dyspnea.  No PND or orthopnea, no leg edema.  He denies chest pain or palpitations.  He has had a nuclear stress test in 2019 which was abnormal revealing a small to moderate sized inferior and inferoapical ischemia but patient preferred medical therapy in view of being asymptomatic without chest pain although has dyspnea.  He is now on aggressive medical therapy with good control of blood pressure, diabetes and lipids.  Past Medical History:  Diagnosis Date  . Anemia 05/26/2014  . Bladder neck obstruction 04/24/2014  . Chronic respiratory failure with hypoxia ([place])   . CKD (chronic kidney disease) 05/26/2014  . COPD (chronic obstructive pulmonary disease) ([place]) 05/26/2014  . Coronary artery calcification seen on CAT scan   . Diabetes mellitus ([place]) 05/26/2014  . Former smoker 05/26/2014  . GERD (gastroesophageal reflux disease) 05/26/2014  . Glaucoma   . Heart murmur    mild  . HTN (hypertension) 05/26/2014  . Ischemic heart disease    Lexiscan myoview stress test 02/08/2018 - no stress sx  reported.  . Lung nodules   . 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 Recurrent urinary tract infection 09/05/2013  . Renal calculus 03/19/2015  . Retention of urine 09/05/2013  . Sleep apnea    wears Bipap  . SOB (shortness of breath)     Past Surgical History:  Procedure Laterality Date  . BLADDER NECK RECONSTRUCTION    . CATARACT EXTRACTION, BILATERAL    . COLONOSCOPY WITH PROPOFOL N/A 09/22/2017   Procedure: COLONOSCOPY WITH PROPOFOL;  Surgeon: Doran Stabler, MD;  Location: [facility];  Service: Gastroenterology;  Laterality: N/A;  . HERNIA REPAIR Bilateral    Inguinal  . VIDEO BRONCHOSCOPY WITH ENDOBRONCHIAL NAVIGATION N/A 10/07/2017   Procedure: VIDEO BRONCHOSCOPY WITH ENDOBRONCHIAL NAVIGATION;  Surgeon: Collene Gobble, MD;  Location: [facility];  Service: Thoracic;  Laterality: N/A;  . VIDEO BRONCHOSCOPY WITH ENDOBRONCHIAL ULTRASOUND N/A 10/07/2017   Procedure: VIDEO BRONCHOSCOPY WITH ENDOBRONCHIAL ULTRASOUND;  Surgeon: Collene Gobble, MD;  Location: [facility];  Service: Thoracic;  Laterality: N/A;    Social History   Socioeconomic History  . Marital status: Married    Spouse name: Not on file  . Number of children: Not on file  . Years of education: Not on file  . Highest education level: Not on file  Occupational History  . Not on file  Social Needs  . Financial resource strain: Not on file  . Food insecurity    Worry: Not on file    Inability: Not on file  . Transportation needs    Medical: Not on file  Non-medical: Not on file  Tobacco Use  . Smoking status: Former Smoker    Packs/day: 0.10    Years: 2.00    Pack years: 0.20    Types: Cigarettes    Quit date: 12/31/2007    Years since quitting: 11.5  . Smokeless tobacco: Former Systems developer  . Tobacco comment: smokes 4-5 every other day  Substance and Sexual Activity  . Alcohol use: No  . Drug use: No  . Sexual activity: Yes    Partners: Female  Lifestyle  . Physical activity     Days per week: Not on file    Minutes per session: Not on file  . Stress: Not on file  Relationships  . Social Herbalist on phone: Not on file    Gets together: Not on file    Attends religious service: Not on file    Active member of club or organization: Not on file    Attends meetings of clubs or organizations: Not on file    Relationship status: Not on file  . Intimate partner violence    Fear of current or ex partner: Not on file    Emotionally abused: Not on file    Physically abused: Not on file    Forced sexual activity: Not on file  Other Topics Concern  . Not on file  Social History Narrative   Still actively driving without problems      12/01 0701 - 12/02 0700 In: 100 [IV Piggyback:100] Out: -   No intake/output data recorded.   ROS  Review of Systems  Constitution: Negative for chills, decreased appetite, malaise/fatigue and weight gain.  Cardiovascular: Positive for dyspnea on exertion. Negative for leg swelling and syncope.  Endocrine: Negative for cold intolerance.  Hematologic/Lymphatic: Does not bruise/bleed easily.  Musculoskeletal: Positive for back pain. Negative for joint swelling.  Gastrointestinal: Negative for abdominal pain, anorexia, change in bowel habit, hematochezia and melena.  Neurological: Positive for focal weakness (bilateral legs since fall). Negative for headaches and light-headedness.  Psychiatric/Behavioral: Negative for depression and substance abuse.  All other systems reviewed and are negative.   Objective  Blood pressure (!) 111/58, pulse 62, temperature (!) 97.2 F (36.2 C), temperature source Axillary, resp. rate 20, height 5\' 3"  (1.6 m), weight 80.7 kg, SpO2 94 %. Body mass index is 31.53 kg/m. Vitals with BMI 07/15/2019 07/14/2019 07/25/2019  Height - - -  Weight - - -  BMI - - -  Systolic 009 381 [id number]  Diastolic 58 57 54  Pulse 62 61 60    Blood pressure (!) 111/58, pulse 62, temperature (!) 97.2 F (36.2 C),  temperature source Axillary, resp. rate 20, height 5\' 3"  (1.6 m), weight 80.7 kg, SpO2 94 %. Body mass index is 31.53 kg/m.  Physical Exam  Constitutional: He is oriented to person, place, and time.  Moderately built and mildly obese in no acute distress.  HENT:  Head: Atraumatic.  Eyes: Conjunctivae are normal.  Neck: Neck supple. No JVD present. No thyromegaly present.  Cardiovascular: Normal rate, regular rhythm, S1 normal and S2 normal. Exam reveals no gallop.  Murmur heard.  Early systolic murmur is present with a grade of 2/6 at the upper right sternal border and apex. Carotid pulses could not be checked as patient is in braces.  Femoral pulses could not be felt due to his obesity.  Bilateral popliteals absent, right pedal pulses are absent, right leg is colder than the left however capillary refill is  normal.  Left leg is warm, left DP 1-2+, left PT absent.  There is no leg edema.  JVD could not be made out due to neck brace.  Pulmonary/Chest: Effort normal and breath sounds normal.  Abdominal: Soft. Bowel sounds are normal.  Neurological: He is alert and oriented to person, place, and time.  Psychiatric: He has a normal mood and affect.    Radiology: Ct Head Wo Contrast  Result Date: 07/28/2019 CLINICAL DATA:  78 year old male with ataxia status post fall backwards. EXAM: CT HEAD WITHOUT CONTRAST TECHNIQUE: Contiguous axial images were obtained from the base of the skull through the vertex without intravenous contrast. COMPARISON:  MiniCAT CT paranasal sinuses 01/12/2018. FINDINGS: Brain: There is disproportionate biparietal cerebral volume loss (series 6, image 37), but elsewhere cerebral volume appears normal for age. No cortical encephalomalacia identified. No midline shift, ventriculomegaly, mass effect, evidence of mass lesion, intracranial hemorrhage or evidence of cortically based acute infarction. Gray-white matter differentiation is within normal limits throughout the brain.  Vascular: Calcified atherosclerosis at the skull base. No suspicious intracranial vascular hyperdensity. Skull: No acute osseous abnormality identified. Incidental prominent occipital arachnoid granulations on series 4, image 17 (normal variant). Sinuses/Orbits: Visualized paranasal sinuses and mastoids are well pneumatized today. Other: Mild posterior vertex scalp hematoma suspected (series 6, image 29). Underlying calvarium intact. Otherwise negative orbit and scalp soft tissues. IMPRESSION: 1. Mild posterior vertex scalp soft tissue injury without underlying fracture. 2.  No acute traumatic injury to the brain identified. 3. Suggestion of disproportionate biparietal cerebral volume loss, non-specific but can be seen with dementia. Electronically Signed   By: Genevie Ann M.D.   On: 08/08/2019 22:40   Ct Chest Wo Contrast  Result Date: 07/31/2019 CLINICAL DATA:  Shortness of breath EXAM: CT CHEST WITHOUT CONTRAST TECHNIQUE: Multidetector CT imaging of the chest was performed following the standard protocol without IV contrast. COMPARISON:  April 20, 2018 FINDINGS: Cardiovascular: Coronary artery calcifications are seen. Scattered aortic atherosclerosis. The heart size is normal. There is no pericardial thickening or effusion. Mediastinum/Nodes: Again noted are scattered mediastinal, pretracheal and prevascular lymph nodes seen. The largest within the anterior subcarinal region measuring 1.3 cm as on the prior exam. The thyroid gland, trachea and esophagus demonstrate no significant findings. Lungs/Pleura: There is new ground-glass patchy airspace opacities within the left upper lobe and throughout the right lung, predominantly within the periphery. There is also new patchy areas of consolidation within the posterior bilateral lower lungs, right greater than left. There is unchanged nodular area of streaky opacity again noted at the left lung base and along the right middle lobe. There is no pleural effusion.  Upper abdomen: The visualized portion of the upper abdomen is unremarkable. Musculoskeletal/Chest wall: There is widening of the anterior inter disc space at T12-L1 which is partially visualized with linear calcifications in the inter disc space. This was not seen on the prior exam and could be due to fracture of the anterior flowing osteophyte. Again noted are anterior flowing osteophytes seen throughout the thoracic spine. IMPRESSION: 1. New ground-glass patchy airspace opacities seen within the left upper lobe and throughout the right lung predominantly within the periphery. There is also streaky areas of consolidation in the posterior lower lungs. This is nonspecific, however could be due to atypical viral pneumonia. 2. Unchanged patchy rounded airspace nodules within the left lower lung base and right middle lobe as on prior CT dating back to 2019. 3. Unchanged scattered mediastinal lymph nodes. 4. New age-indeterminate widening of the  anterior interdisc space at T12-L1 which is partially visualized and concerning for fracture of the anterior flowing osteophyte. Would correlate with patient's site of pain and further evaluation is required, would recommend dedicated MRI Electronically Signed   By: Prudencio Pair M.D.   On: 07/17/2019 02:33   Ct Cervical Spine Wo Contrast  Result Date: 07/21/2019 CLINICAL DATA:  78 year old male with ataxia status post fall backwards. EXAM: CT CERVICAL SPINE WITHOUT CONTRAST TECHNIQUE: Multidetector CT imaging of the cervical spine was performed without intravenous contrast. Multiplanar CT image reconstructions were also generated. COMPARISON:  Head CT today reported separately. FINDINGS: Alignment: Reversal of lower cervical lordosis. Cervicothoracic junction alignment is within normal limits. Bilateral posterior element alignment is within normal limits. Skull base and vertebrae: Visualized skull base is intact. No atlanto-occipital dissociation. Incidental prominent  occipital bone arachnoid granulations (normal variant). No acute osseous abnormality identified. Soft tissues and spinal canal: No visible canal hematoma. There is prevertebral fluid or edema at the C3 level demonstrated on series 9, image 118. No adjacent vertebral fracture identified. Otherwise negative visible noncontrast neck soft tissues. Disc levels: There appears to be significant degenerative cervical spinal stenosis at most levels, including at least moderate suspected stenosis at C2-C3 (series 9, image 100). Lower cervical spinal stenosis is related to bulky disc osteophyte complex. Upper chest: T2 through T4 upper thoracic ankylosis. Grossly intact visible upper thoracic levels. Osteopenia. There is patchy nonspecific ground-glass opacity in the visible right upper lobe (series 5, images 87 and 103). Negative visible left lung apex. Negative visible noncontrast superior mediastinum. IMPRESSION: 1. No cervical spine fracture is identified, but mild prevertebral fluid raises the possibility of anterior ligamentous injury at C3. A noncontrast Cervical Spine MRI could evaluate further. 2. 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 This could predispose to blunt trauma to the cervical spinal cord in the setting of fall. 3. Nonspecific pulmonary ground-glass opacity in the right upper lobe. Consider acute viral/atypical respiratory infection. 4. 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 Electronically Signed   By: Genevie Ann M.D.   On: 07/27/2019 22:49   Mr Cervical Spine Wo Contrast  Result Date: 07/28/2019 CLINICAL DATA:  Weakness, fall hit head EXAM: MRI CERVICAL SPINE WITHOUT CONTRAST TECHNIQUE: Multiplanar, multisequence MR imaging of the cervical spine was performed. No intravenous contrast was administered. COMPARISON:  None. FINDINGS: Alignment: There is reversal of the normal cervical lordosis. A grade 1 anterolisthesis of C4 on C5 is seen measuring 3  mm. Vertebrae: There is a focal disruption of the anterior longitudinal ligament seen at C5-C6 with fluid between the cleft. There is also a probable acutely fractured anterior osteophyte at the anterior inferior endplate of C5. Increased marrow signal seen at the anterior vertebral bodies of C5, C6, C7. There is increased signal seen between the inter spinous ligament at C5-C6 with surrounding paraspinal soft tissue edema. Cord: There is increased cord signal seen at the C5 level with mild cord expansion, likely acute cord contusion. No definite blood products seen within the cord. There is increased cord signal seen from C6 through C7 which is likely due to myelomalacia. There is also minimally increased cord signal seen at C2-C3, likely due to early myelomalacia. Posterior Fossa, vertebral arteries, paraspinal tissues: Significant prevertebral fluid and soft tissue edema is noted from C2 through C6. There is also paraspinal soft tissue edema seen posteriorly. The visualized portion of the posterior fossa is unremarkable. Normal flow voids seen within the vertebral arteries. Disc levels:  C1-C2: Atlanto-axial junction is normal, without canal narrowing C2-C3: There is a disc osteophyte complex and uncovertebral osteophytes which causes moderate bilateral neural foraminal narrowing. The central thecal sac measures 4 mm in AP diameter. C3-C4: There is a disc osteophyte complex and uncovertebral osteophytes which causes severe left and moderate right neural foraminal narrowing. The central thecal sac is effaced measuring 5 mm in AP diameter. C4-C5: There is a disc osteophyte complex and uncovertebral osteophytes which causes moderate bilateral neural foraminal narrowing. The central thecal sac is effaced measuring 6 mm in AP diameter. C5-C6: There is a disc osteophyte complex which is eccentric to the left with uncovertebral osteophytes. Severe bilateral neural foraminal narrowing is seen. There is effacement of the  thecal sac which measures 3 mm in AP diameter. C6-C7: There is disc osteophyte complex and uncovertebral osteophytes which causes severe bilateral neural foraminal narrowing. There is effacement of the thecal sac which measures 4 mm in AP diameter. C7-T1: There is a disc osteophyte complex with a central disc protrusion and uncovertebral osteophytes which causes severe right and moderate to severe left neural foraminal narrowing. Central thecal sac is effaced measuring 5 mm in AP diameter. IMPRESSION: 1. Acute disruption of the anterior longitudinal ligament at C5-C6 with a acute fracture of the anterior inferior endplate of the C5 osteophyte . Significant prevertebral soft tissue edema and fluid is seen extending from C2 through C6. 2. Marrow edema/osseous contusions involving the C5-C7 anterior vertebral bodies. 3. interspinous ligamentous injury at C5-C6. 4. Findings suggestive of acute cord contusion seen at C5 with cord edema. No definite epidural hematoma or hemorrhage. 5. 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 6. 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 These results were called by telephone at the time of interpretation on 07/30/2019 at 3:39am to provider [facility] , who verbally acknowledged these results. Electronically Signed   By: Prudencio Pair M.D.   On: 07/16/2019 03:39   Mr Lumbar Spine Wo Contrast  Addendum Date: 08/01/2019   ADDENDUM REPORT: 08/03/2019 03:46 ADDENDUM: Study discussed by telephone with Dr. Shela Leff on 08/11/2019 at 0340 hours. Electronically Signed   By: Genevie Ann M.D.   On: 07/20/2019 03:46   Result Date: 07/29/2019 CLINICAL DATA:  78 year old male with widespread thoracic spine ankylosis status post fall. Suspected injury through the disc space at T11-T12 on chest CT today. EXAM: MRI LUMBAR SPINE WITHOUT CONTRAST  TECHNIQUE: Multiplanar, multisequence MR imaging of the lumbar spine was performed. No intravenous contrast was administered. COMPARISON:  Chest CT earlier today. FINDINGS: Segmentation: In conjunction with the chest CT today, the L5 level is sacralized with a vestigial L5-S1 disc space. Correlation with radiographs is recommended prior to any operative intervention. Alignment: Straightening of lumbar lordosis. Mild focal lordosis at T12-L1. Vertebrae: Widespread thoracic spine ankylosis which likely continued through the T12-L1 disc space. However, there is injury through that disc space and distraction anteriorly resulting in mild lordosis (series 6, image 9). Disrupted anterior longitudinal ligament. Posttraumatic signal changes in the disc. There is an associated nondisplaced fracture through the L1 spinous process (same image). And possibly a fracture also of the right T12 lamina/facet (series 7, image 7). In addition, there is mild compression of the T12 and L1 endplates with mild marrow edema, more pronounced along the right lateral L1 superior endplate. Normal background bone marrow signal. The L2 through L4 levels appear to  remain intact. Sacralized L5. Intact visible sacrum and SI joints. Conus medullaris and cauda equina: Conus extends to the L1 level. No lower spinal cord or conus signal abnormality. Paraspinal and other soft tissues: There is edema tracking along the right chorea of the diaphragm and lateral to the right psoas muscle. Less pronounced left-side paraspinal soft tissue edema. There is mild posterior paraspinal muscle injury at the L1 level greater on the left. Additionally, there is probably a posttraumatic hematoma tracking in the lateral fibers of the right lumbar paraspinal muscles at L2 and L3 (series 8, image 18). This is not included on the sagittal images. Partially visible distended urinary bladder. Disc levels: Ordinary although advanced lumbar spine disc and endplate  degeneration. Severe disc space loss with bulky disc osteophyte complex throughout the lumbar spine. Subsequent -Severe right lateral recess stenosis at L2-L3 (series 8, image 18, descending right L3 nerve level). -moderate spinal and severe bilateral lateral recess stenosis at L3-L4 (descending L4 nerve levels). -severe right greater than left lateral recess stenosis at L4-L5 (series 8, image 27) with mild spinal stenosis (L5 nerve levels). IMPRESSION: 1. Acute fracture through ankylosed spine at the T12-L1 disc space. Disrupted anterior longitudinal ligament, disc, and propagation of the fracture through the posterior elements including the L1 spinous process. This is most likely an unstable injury. Recommend Neurosurgery consultation. 2. Associated paraspinal muscle injury, including a small intramuscular hematoma suspected along the lateral right erector spinae fibers at L2-L3. 3. Transitional lumbosacral anatomy, with sacralized L5 level and vestigial L5-S1 disc space. 4. Superimposed lumbar spine degeneration: advanced lumbar disc and endplate disease with multilevel severe lateral recess stenosis, moderate L3-L4 spinal stenosis. 5. Distended urinary bladder. Electronically Signed: By: Genevie Ann M.D. On: 07/25/2019 03:23    Laboratory examination:   Recent Labs    01/11/19 1527 05/20/19 1407 07/30/2019 2125  NA 134* 137 135  K 4.6 4.8 4.6  CL 102 103 105  CO2 23 27 18*  GLUCOSE 96 102* 161*  BUN 52* 46* 51*  CREATININE 2.02* 1.85* 1.82*  CALCIUM 8.7 9.7 8.8*  GFRNONAA  --   --  35*  GFRAA  --   --  41*   CMP Latest Ref Rng & Units 07/28/2019 05/20/2019 01/11/2019  Glucose 70 - 99 mg/dL 161(H) 102(H) 96  BUN 8 - 23 mg/dL 51(H) 46(H) 52(H)  Creatinine 0.61 - 1.24 mg/dL 1.82(H) 1.85(H) 2.02(H)  Sodium 135 - 145 mmol/L 135 137 134(L)  Potassium 3.5 - 5.1 mmol/L 4.6 4.8 4.6  Chloride 98 - 111 mmol/L 105 103 102  CO2 22 - 32 mmol/L 18(L) 27 23  Calcium 8.9 - 10.3 mg/dL 8.8(L) 9.7 8.7  Total  Protein 6.5 - 8.1 g/dL 7.1 7.9 7.2  Total Bilirubin 0.3 - 1.2 mg/dL 0.9 0.4 0.3  Alkaline Phos 38 - 126 U/L 73 82 68  AST 15 - 41 U/L 47(H) 21 17  ALT 0 - 44 U/L 40 27 18   CBC Latest Ref Rng & Units 08/01/2019 05/20/2019 01/11/2019  WBC 4.0 - 10.5 K/uL 20.3(H) 10.7(H) 10.7(H)  Hemoglobin 13.0 - 17.0 g/dL 12.9(L) 12.5(L) 13.3  Hematocrit 39.0 - 52.0 % 40.4 38.2(L) 39.5  Platelets 150 - 400 K/uL 152 206.0 156.0   Lipid Panel     Component Value Date/Time   CHOL 89 06/04/2018 1152   TRIG 89.0 06/04/2018 1152   HDL 35.60 (L) 06/04/2018 1152   CHOLHDL 3 06/04/2018 1152   VLDL 17.8 06/04/2018 1152   LDLCALC 36  06/04/2018 1152   HEMOGLOBIN A1C Lab Results  Component Value Date   HGBA1C 5.1 05/30/2019   TSH Recent Labs    01/11/19 1527  TSH 0.73   Medications   Prior to Admission medications   Medication Sig Start Date End Date Taking? Authorizing Provider  allopurinol (ZYLOPRIM) 100 MG tablet Take 1 tablet (100 mg total) by mouth daily. 01/14/19  Yes Briscoe Deutscher, DO  aspirin EC 81 MG tablet Take 81 mg by mouth daily.    Yes [provider]  atorvastatin (LIPITOR) 10 MG tablet TAKE 1 TABLET EVERY DAY Patient taking differently: Take 10 mg by mouth every morning.  05/27/19  Yes Miquel Dunn, NP  budesonide-formoterol [facility]) 160-4.5 MCG/ACT inhaler Inhale 2 puffs into the lungs 2 (two) times daily. Patient taking differently: Inhale 2 puffs into the lungs every morning.  04/01/18  Yes Brand Males, MD  calcium carbonate (CALCIUM 600) 600 MG TABS tablet Take 600 mg by mouth daily.    Yes [provider]  Cholecalciferol (D 10000) 10000 units CAPS Take 10,000 Units by mouth every other day.    Yes [provider]  Coenzyme Q-10 200 MG CAPS Take 200 mg by mouth daily.    Yes [provider]  dorzolamide (TRUSOPT) 2 % ophthalmic solution Place 1 drop into both eyes every morning.  10/29/17  Yes [provider]  Insulin  Syringe-Needle U-100 (BD INSULIN SYRINGE ULTRAFINE) 31G X 5/16" 0.5 ML MISC 1 each by Does not apply route daily. 08/23/18  Yes Briscoe Deutscher, DO  ketorolac (ACULAR) 0.5 % ophthalmic solution Place 1 drop into both eyes 3 (three) times daily.  06/02/18  Yes [provider]  LANTUS 100 UNIT/ML injection INJECT  50 UNITS SUBCUTANEOUSLY AT BEDTIME Patient taking differently: Inject 20 Units into the skin at bedtime.  07/10/19  Yes Leamon Arnt, MD  latanoprost (XALATAN) 0.005 % ophthalmic solution Place 1 drop into both eyes at bedtime.  05/11/19  Yes [provider]  metoprolol succinate (TOPROL-XL) 50 MG 24 hr tablet TAKE 1 TABLET EVERY DAY Patient taking differently: Take 50 mg by mouth daily.  06/13/19  Yes Marin Olp, MD  OXYGEN Inhale 1.5 L into the lungs continuous.    Yes [provider]  sitaGLIPtin (JANUVIA) 50 MG tablet 1 tab daily Patient taking differently: Take 50 mg by mouth daily.  01/14/19  Yes Briscoe Deutscher, DO  SPIRIVA RESPIMAT 2.5 MCG/ACT AERS INHALE 2 PUFFS INTO THE LUNGS DAILY. Patient taking differently: Inhale 2 puffs into the lungs every morning.  04/05/18  Yes Marin Olp, MD  Syringe, Disposable, 1 ML MISC Use with insulin needle. 02/19/17  Yes Briscoe Deutscher, DO  Travoprost, BAK Free, (TRAVATAN Z) 0.004 % SOLN ophthalmic solution Place 1 drop in both eyes once daily 05/13/19  Yes Briscoe Deutscher, DO  triamcinolone (KENALOG) 0.1 % paste Use as directed 1 application in the mouth or throat 2 (two) times daily. Patient taking differently: Use as directed 1 application in the mouth or throat 2 (two) times daily as needed (skin care).  01/11/19  Yes Briscoe Deutscher, DO  HYDROcodone-acetaminophen (NORCO) 10-325 MG tablet Take 1 tablet by mouth every 8 (eight) hours as needed. Patient not taking: Reported on 07/29/2019 11/10/18   Briscoe Deutscher, DO     Current Outpatient Medications  Medication Instructions  . allopurinol (ZYLOPRIM) 100 mg, Oral,  Daily  . aspirin EC 81 mg, Oral, Daily  . atorvastatin (LIPITOR) 10 MG tablet  TAKE 1 TABLET EVERY DAY  . budesonide-formoterol (SYMBICORT) 160-4.5 MCG/ACT inhaler 2 puffs, Inhalation, 2 times daily  . calcium carbonate (CALCIUM 600) 600 mg, Oral, Daily  . Cholecalciferol (D 10000) 10,000 Units, Oral, Every other day  . Coenzyme Q-10 200 mg, Oral, Daily  . dorzolamide (TRUSOPT) 2 % ophthalmic solution 1 drop, Both Eyes, BH-each morning  . HYDROcodone-acetaminophen (NORCO) 10-325 MG tablet 1 tablet, Oral, Every 8 hours PRN  . Insulin Syringe-Needle U-100 (BD INSULIN SYRINGE ULTRAFINE) 31G X 5/16" 0.5 ML MISC 1 each, Does not apply, Daily  . ketorolac (ACULAR) 0.5 % ophthalmic solution 1 drop, Both Eyes, 3 times daily  . LANTUS 100 UNIT/ML injection INJECT  50 UNITS SUBCUTANEOUSLY AT BEDTIME  . latanoprost (XALATAN) 0.005 % ophthalmic solution 1 drop, Both Eyes, Daily at bedtime  . metoprolol succinate (TOPROL-XL) 50 MG 24 hr tablet TAKE 1 TABLET EVERY DAY  . OXYGEN 1.5 L, Inhalation, Continuous  . sitaGLIPtin (JANUVIA) 50 MG tablet 1 tab daily  . SPIRIVA RESPIMAT 2.5 MCG/ACT AERS INHALE 2 PUFFS INTO THE LUNGS DAILY.  [name] Syringe, Disposable, 1 ML MISC Use with insulin needle.  . Travoprost, BAK Free, (TRAVATAN Z) 0.004 % SOLN ophthalmic solution Place 1 drop in both eyes once daily  . triamcinolone (KENALOG) 0.1 % paste 1 application, Mouth/Throat, 2 times daily    Cardiac Studies:   Lexiscan myoview stress test 02/08/2018: 1. Lexiscan stress test was performed. Exercise capacity was not assessed. No stress symptoms reported. Normal blood pressure. The resting and stress electrocardiogram demonstrated normal sinus rhythm, normal resting conduction, no resting arrhythmias and normal rest repolarization. Stress EKG is non diagnostic for ischemia as it is a pharmacologic stress. 2.The overall quality of the study is excellent. Left ventricular cavity is noted to be normal on the rest and stress  studies. Gated SPECT images reveal normal myocardial thickening and wall motion. The left ventricular ejection fraction was calculated or visually estimated to be 79%. Medium-sized, moderately intensity, perfusion defect in basal to apical inferior, inferolateral myocardium, seen on stress SPECT images, with near complete reversibility on rest images. SDS=6. Findings suggest LCX/PDA territory ischemia. 3. Intermediate risk study.  Echocardiogram 07/15/2019: PRELIMINARY Normal LV systolic function, right ventricle mildly enlarged with normal RV systolic function, moderate pulmonary hypertension, mild mitral annular calcification and aortic valve calcification.  Mild to moderate TR.  No significant change from 09/15/2017.  Assessment  1.  Preoperative cardiovascular stratification, patient with symptomatic cervical cord compression with bilateral lower extremity weakness after a fall yesterday 2.  Abnormal nuclear stress test on 02/08/2018, probably suggest underlying coronary artery disease. 3.  COPD, emphysema. 4.  Diabetes mellitus type 2 controlled without hypoglycemia with chronic kidney disease, stage IIIb. 5.  Peripheral arterial disease, asymptomatic with markedly reduced pulse in the right lower extremity and also cold right lower extremity without clinical evidence of CLI  EKG 08/04/2019: Sinus bradycardia at the rate of 56 bpm, normal axis.  Sagging ST segments, probably normal variant.  No evidence of ischemia, normal QT interval.  No significant change from 03/09/2019.  Recommendations:   Patient is at moderate risk for perioperative cardiovascular events.  But this should not deter her from proceeding with cervical cord decompression.  Patient is a retired Film/video editor, I have discussed with him regarding the risks associated with surgery.  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  This is due to underlying COPD.  Diabetes is well controlled.  He  also has peripheral arterial  disease but he is asymptomatic however his activities markedly elevated at home, only does activities of daily living due to dyspnea that is chronic.  No PND or orthopnea and there is no clinical evidence of heart failure.  Please call if questions.  Adrian Prows, MD, [facility] 08/10/2019, 9:54 AM [facility] [place] Pager: [phone] Office: [phone] If no answer Cell [phone]

## 2019-07-13 NOTE — ED Notes (Signed)
Neuro md at bedside

## 2019-07-13 NOTE — ED Notes (Signed)
Pt yells out at times. Pt states that he is just praying.

## 2019-07-13 NOTE — ED Notes (Signed)
Neurosurgeon at bedside °

## 2019-07-13 NOTE — Progress Notes (Signed)
Patient is on NIV QHS at this time tolerating it well.  

## 2019-07-13 NOTE — ED Notes (Signed)
Received a call from radiology, that their radiologist needs to speak to the admitting md but Dr. Marlowe Sax has not responded to any pages. Given Network engineer number that usually pages mds and called charge RN. Pt stable at this time. Will read MRI result and page md also.

## 2019-07-13 NOTE — Anesthesia Preprocedure Evaluation (Addendum)
Anesthesia Evaluation  Patient identified by MRN, date of birth, ID band Patient awake    Reviewed: Allergy & Precautions, NPO status , Patient's Chart, lab work & pertinent test results  Airway Mallampati: III  TM Distance: >3 FB Neck ROM: Full    Dental no notable dental hx.    Pulmonary sleep apnea (on BiPAP) , pneumonia, unresolved, COPD,  COPD inhaler and oxygen dependent, former smoker,  O2 at night   Pulmonary exam normal breath sounds clear to auscultation       Cardiovascular hypertension, Pt. on home beta blockers + CAD  Normal cardiovascular exam Rhythm:Regular Rate:Normal  ECG: rate 61. Sinus rhythm Atrial premature complex   Neuro/Psych Central cord syndrome  Neuromuscular disease  C-spine not cleared negative psych ROS   GI/Hepatic negative GI ROS, Neg liver ROS,   Endo/Other  diabetes, Insulin Dependent  Renal/GU CRFRenal disease     Musculoskeletal Gout   Abdominal (+) + obese,   Peds  Hematology HLD   Anesthesia Other Findings Cervical Stenosis with myelopathy  Reproductive/Obstetrics                           Anesthesia Physical Anesthesia Plan  ASA: IV  Anesthesia Plan: General   Post-op Pain Management:    Induction: Intravenous  PONV Risk Score and Plan: 3 and Ondansetron, Dexamethasone and Treatment may vary due to age or medical condition  Airway Management Planned: Oral ETT and Video Laryngoscope Planned  Additional Equipment:   Intra-op Plan:   Post-operative Plan: Possible Post-op intubation/ventilation  Informed Consent: I have reviewed the patients History and Physical, chart, labs and discussed the procedure including the risks, benefits and alternatives for the proposed anesthesia with the patient or authorized representative who has indicated his/her understanding and acceptance.     Dental advisory given  Plan Discussed with:  CRNA  Anesthesia Plan Comments:       Anesthesia Quick Evaluation

## 2019-07-13 NOTE — ED Notes (Signed)
Received call from Dr. Marlowe Sax with MRI results and wanting to use speaker phone to tell the patient. Gave md this RN's cell number and put it on speaker so the pt could speak with md. Pt states understanding about fractures and that neuro is on the way to see pt. All questions answered.

## 2019-07-13 NOTE — ED Notes (Signed)
Report given to Damar, RN, and pt transported up stairs.

## 2019-07-13 NOTE — Progress Notes (Signed)
Patient was placed on BIPAP 12/6 with 5L O2 bled in via full face mask (what patient wears at home). Patient is tolerating BIPAP well at this time without any complications.

## 2019-07-13 NOTE — Brief Op Note (Signed)
08/09/2019 - 07/14/2019  5:23 PM  PATIENT:  Lucas Price  78 y.o. male  PRE-OPERATIVE DIAGNOSIS:  Cervical Stenosis with myelopathy  POST-OPERATIVE DIAGNOSIS:  Cervical Stenosis with myelopathy  PROCEDURE:  Procedure(s) with comments: ANTERIOR CERVICAL DECOMPRESSION/DISCECTOMY FUSION CERVICAL FIVE-SIX, CERVICAL SIX-SEVEN, CERVICAL SEVEN-THORACIC ONE (N/A) - ANTERIOR CERVICAL DECOMPRESSION/DISCECTOMY FUSION CERVICAL FIVE-SIX/SIX-SEVEN/SEVEN-THORACIC ONE THREE LEVELS  SURGEON:  Surgeon(s) and Role:    * Consuella Lose, MD - Primary    * Ashok Pall, MD - Assisting  ANESTHESIA:   general  EBL:  80 mL   BLOOD ADMINISTERED:none  DRAINS: none   LOCAL MEDICATIONS USED:  MARCAINE    and XYLOCAINE   SPECIMEN:  No Specimen  DISPOSITION OF SPECIMEN:  N/A  COUNTS:  YES  TOURNIQUET:  * No tourniquets in log *  DICTATION: .Note written in EPIC  PLAN OF CARE: patient has active order for inpatient  PATIENT DISPOSITION:  PACU - hemodynamically stable.   Delay start of Pharmacological VTE agent (>24hrs) due to surgical blood loss or risk of bleeding: yes

## 2019-07-13 NOTE — Progress Notes (Signed)
  Echocardiogram 2D Echocardiogram has been performed.  Lucas Price 07/23/2019, 9:40 AM

## 2019-07-13 NOTE — Telephone Encounter (Signed)
Copied from [place] [id number]. Topic: Quick Communication - See Telephone Encounter >> Jul 12, 2019  6:15 PM Loma Boston wrote: CRM for notification. See Telephone encounter for: 08/07/2019. Pt son called in a wanted dad's dr to know he has been taken to [facility] by ambulance, father fell, no feeling in his leg, Told pt that dad had appt on 12/14 to [facility] to [place]. Would be forwarding a note in chart

## 2019-07-13 NOTE — ED Notes (Signed)
Aspen collar applied with 3 people assisting.

## 2019-07-13 NOTE — Consult Note (Signed)
dating back to 2019. 3. Unchanged scattered mediastinal lymph nodes. 4. New age-indeterminate widening of the anterior interdisc space at T12-L1 which is partially visualized and concerning for fracture of the anterior flowing osteophyte. Would correlate with patient's site of pain and further evaluation is required, would recommend dedicated MRI Electronically Signed   By: Prudencio Pair M.D.   On: 07/31/2019 02:33   Ct Cervical Spine Wo Contrast  Result  Date: 07/15/2019 CLINICAL DATA:  78 year old male with ataxia status post fall backwards. EXAM: CT CERVICAL SPINE WITHOUT CONTRAST TECHNIQUE: Multidetector CT imaging of the cervical spine was performed without intravenous contrast. Multiplanar CT image reconstructions were also generated. COMPARISON:  Head CT today reported separately. FINDINGS: Alignment: Reversal of lower cervical lordosis. Cervicothoracic junction alignment is within normal limits. Bilateral posterior element alignment is within normal limits. Skull base and vertebrae: Visualized skull base is intact. No atlanto-occipital dissociation. Incidental prominent occipital bone arachnoid granulations (normal variant). No acute osseous abnormality identified. Soft tissues and spinal canal: No visible canal hematoma. There is prevertebral fluid or edema at the C3 level demonstrated on series 9, image 118. No adjacent vertebral fracture identified. Otherwise negative visible noncontrast neck soft tissues. Disc levels: There appears to be significant degenerative cervical spinal stenosis at most levels, including at least moderate suspected stenosis at C2-C3 (series 9, image 100). Lower cervical spinal stenosis is related to bulky disc osteophyte complex. Upper chest: T2 through T4 upper thoracic ankylosis. Grossly intact visible upper thoracic levels. Osteopenia. There is patchy nonspecific ground-glass opacity in the visible right upper lobe (series 5, images 87 and 103). Negative visible left lung apex. Negative visible noncontrast superior mediastinum. IMPRESSION: 1. No cervical spine fracture is identified, but mild prevertebral fluid raises the possibility of anterior ligamentous injury at C3. A noncontrast Cervical Spine MRI could evaluate further. 2. 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 This could predispose to blunt trauma to the cervical spinal cord in the setting of fall. 3. Nonspecific pulmonary ground-glass  opacity in the right upper lobe. Consider acute viral/atypical respiratory infection. 4. 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 Electronically Signed   By: Genevie Ann M.D.   On: 08/05/2019 22:49   Mr Cervical Spine Wo Contrast  Result Date: 07/22/2019 CLINICAL DATA:  Weakness, fall hit head EXAM: MRI CERVICAL SPINE WITHOUT CONTRAST TECHNIQUE: Multiplanar, multisequence MR imaging of the cervical spine was performed. No intravenous contrast was administered. COMPARISON:  None. FINDINGS: Alignment: There is reversal of the normal cervical lordosis. A grade 1 anterolisthesis of C4 on C5 is seen measuring 3 mm. Vertebrae: There is a focal disruption of the anterior longitudinal ligament seen at C5-C6 with fluid between the cleft. There is also a probable acutely fractured anterior osteophyte at the anterior inferior endplate of C5. Increased marrow signal seen at the anterior vertebral bodies of C5, C6, C7. There is increased signal seen between the inter spinous ligament at C5-C6 with surrounding paraspinal soft tissue edema. Cord: There is increased cord signal seen at the C5 level with mild cord expansion, likely acute cord contusion. No definite blood products seen within the cord. There is increased cord signal seen from C6 through C7 which is likely due to myelomalacia. There is also minimally increased cord signal seen at C2-C3, likely due to early myelomalacia. Posterior Fossa, vertebral arteries, paraspinal tissues: Significant prevertebral fluid and soft tissue edema is noted from C2 through C6. There is also paraspinal soft tissue edema seen posteriorly. The visualized portion  without atrophy or fasciculations.  Sensory: Sensation decreased but symmetric in BUE and BLE, nonspecific dermatomal pattern Deep Tendon Reflexes    - 3+ and symmetric in the biceps and patellae.   Results - Imaging/Labs   Results for orders placed or performed during the hospital encounter of 07/22/2019 (from the past 48 hour(s))  CBC with Differential     Status: Abnormal   Collection Time: 08/01/2019  9:25 PM  Result Value Ref Range   WBC 20.3 (H) 4.0 - 10.5 K/uL   RBC 4.11 (L) 4.22 - 5.81 MIL/uL   Hemoglobin 12.9 (L) 13.0 - 17.0 g/dL   HCT 40.4 39.0 - 52.0 %   MCV 98.3 80.0 - 100.0 fL   MCH 31.4 26.0 - 34.0 pg   MCHC 31.9 30.0 - 36.0 g/dL   RDW 14.4 11.5 - 15.5 %   Platelets 152 150 - 400 K/uL   nRBC 0.0 0.0 - 0.2 %   Neutrophils Relative % 88 %   Neutro Abs 17.9 (H) 1.7 - 7.7 K/uL   Lymphocytes Relative 6 %   Lymphs Abs 1.2 0.7 - 4.0 K/uL   Monocytes Relative 5 %   Monocytes Absolute 1.0 0.1 - 1.0 K/uL   Eosinophils Relative 0 %   Eosinophils Absolute 0.0 0.0 - 0.5 K/uL   Basophils Relative 0 %   Basophils Absolute 0.0 0.0 - 0.1 K/uL   Immature Granulocytes 1 %   Abs Immature Granulocytes 0.17 (H) 0.00 - 0.07 K/uL    Comment: Performed at [facility], [address] [place],  [address]  Comprehensive metabolic panel     Status: Abnormal   Collection Time: 07/17/2019  9:25 PM  Result Value Ref Range   Sodium 135 135 - 145 mmol/L   Potassium 4.6 3.5 - 5.1 mmol/L   Chloride 105 98 - 111 mmol/L   CO2 18 (L) 22 - 32 mmol/L   Glucose, Bld 161 (H) 70 - 99 mg/dL   BUN 51 (H) 8 - 23 mg/dL   Creatinine, Ser 1.82 (H) 0.61 - 1.24 mg/dL   Calcium 8.8 (L) 8.9 - 10.3 mg/dL    Total Protein 7.1 6.5 - 8.1 g/dL   Albumin 3.1 (L) 3.5 - 5.0 g/dL   AST 47 (H) 15 - 41 U/L   ALT 40 0 - 44 U/L   Alkaline Phosphatase 73 38 - 126 U/L   Total Bilirubin 0.9 0.3 - 1.2 mg/dL   GFR calc non Af Amer 35 (L) >60 mL/min   GFR calc Af Amer 41 (L) >60 mL/min   Anion gap 12 5 - 15    Comment: Performed at [facility], [place] [address], [place], [place] [address]  SARS CORONAVIRUS 2 (TAT 6-24 HRS) Nasopharyngeal Nasopharyngeal Swab     Status: None   Collection Time: 07/28/2019 11:00 PM   Specimen: Nasopharyngeal Swab  Result Value Ref Range   SARS Coronavirus 2 NEGATIVE NEGATIVE    Comment: (NOTE) SARS-CoV-2 target nucleic acids are NOT DETECTED. The SARS-CoV-2 RNA is generally detectable in upper and lower respiratory specimens during the acute phase of infection. Negative results do not preclude SARS-CoV-2 infection, do not rule out co-infections with other pathogens, and should not be used as the sole basis for treatment or other patient management decisions. Negative results must be combined with clinical observations, patient history, and epidemiological information. The expected result is Negative. Fact Sheet for Patients: SugarRoll.be Fact Sheet for Healthcare Providers: https://www.woods-mathews.com/ This test is  Patient taking differently: Take 50 mg by mouth daily.  01/14/19  Yes Briscoe Deutscher, DO  SPIRIVA RESPIMAT 2.5 MCG/ACT AERS INHALE 2 PUFFS INTO THE LUNGS DAILY. Patient taking differently: Inhale 2 puffs into the lungs every morning.  04/05/18  Yes Marin Olp, MD  Syringe, Disposable, 1 ML MISC Use with insulin needle. 02/19/17  Yes Briscoe Deutscher, DO  Travoprost, BAK Free, (TRAVATAN Z) 0.004 % SOLN ophthalmic solution Place 1 drop in both eyes once daily 05/13/19  Yes Briscoe Deutscher, DO  triamcinolone (KENALOG) 0.1 % paste Use as directed 1 application in the mouth or throat 2 (two) times daily. Patient taking differently: Use as directed 1 application in the mouth or throat 2 (two) times daily as needed (skin care).  01/11/19  Yes Briscoe Deutscher, DO  HYDROcodone-acetaminophen (NORCO) 10-325 MG tablet Take 1 tablet by mouth every 8 (eight) hours as needed. Patient not taking:  Reported on 08/10/2019 11/10/18   Briscoe Deutscher, DO    ALLERGY: No Known Allergies  Social History   Tobacco Use   Smoking status: Former Smoker    Packs/day: 0.10    Years: 2.00    Pack years: 0.20    Types: Cigarettes    Quit date: 12/31/2007    Years since quitting: 11.5   Smokeless tobacco: Former Systems developer   Tobacco comment: smokes 4-5 every other day  Substance Use Topics   Alcohol use: No     Family History  Problem Relation Age of Onset   Heart disease Mother    Diabetes Mother      ROS   Review of Systems  Constitutional: Negative.   HENT: Negative.   Eyes: Negative.   Respiratory: Negative.   Cardiovascular: Negative.   Gastrointestinal: Negative for nausea and vomiting.  Musculoskeletal: Positive for back pain, myalgias and neck pain.  Skin: Negative.   Neurological: Positive for tingling, sensory change, focal weakness and weakness. Negative for dizziness, tremors, speech change, loss of consciousness and headaches.    Exam   Vitals:   07/22/2019 0030 08/11/2019 0130  BP: (!) 117/54 (!) 117/57  Pulse: 60 61  Resp: 20 (!) 23  Temp:    SpO2: 100% 94%   General appearance: elderly male in [place] c collar Eyes: No scleral injection Cardiovascular: Regular rate and rhythm without murmurs, rubs, gallops. No edema or variciosities. Distal pulses normal. Pulmonary: Effort normal, non-labored breathing Musculoskeletal:     Muscle tone upper extremities: Normal    Muscle tone lower extremities: Normal    Motor exam: Upper Extremities Deltoid Bicep Tricep Grip  Right 4/5 4+/5 4+/5 3/5  Left 4/5 4+/5 4+/5 4-/5   Lower Extremity IP Quad PF DF EHL  Right 2/5 3/5 4+/5 4+/5 4+/5  Left 2/5 3/5 4-/5 4-/5 4-/5   Neurological Mental Status:    - Patient is awake, alert, oriented to person, place, month, year, and situation    - Patient is able to give a clear and coherent history.    - No signs of aphasia or neglect Cranial Nerves    - II: Visual  Fields are full. PERRL    - III/IV/VI: EOMI without ptosis or diploplia.     - V: Facial sensation is grossly normal    - VII: Facial movement is symmetric.     - VIII: hearing is intact to voice    - X: Uvula elevates symmetrically    - XI: Shoulder shrug is symmetric.    - XII: tongue is midline  without atrophy or fasciculations.  Sensory: Sensation decreased but symmetric in BUE and BLE, nonspecific dermatomal pattern Deep Tendon Reflexes    - 3+ and symmetric in the biceps and patellae.   Results - Imaging/Labs   Results for orders placed or performed during the hospital encounter of 07/22/2019 (from the past 48 hour(s))  CBC with Differential     Status: Abnormal   Collection Time: 08/01/2019  9:25 PM  Result Value Ref Range   WBC 20.3 (H) 4.0 - 10.5 K/uL   RBC 4.11 (L) 4.22 - 5.81 MIL/uL   Hemoglobin 12.9 (L) 13.0 - 17.0 g/dL   HCT 40.4 39.0 - 52.0 %   MCV 98.3 80.0 - 100.0 fL   MCH 31.4 26.0 - 34.0 pg   MCHC 31.9 30.0 - 36.0 g/dL   RDW 14.4 11.5 - 15.5 %   Platelets 152 150 - 400 K/uL   nRBC 0.0 0.0 - 0.2 %   Neutrophils Relative % 88 %   Neutro Abs 17.9 (H) 1.7 - 7.7 K/uL   Lymphocytes Relative 6 %   Lymphs Abs 1.2 0.7 - 4.0 K/uL   Monocytes Relative 5 %   Monocytes Absolute 1.0 0.1 - 1.0 K/uL   Eosinophils Relative 0 %   Eosinophils Absolute 0.0 0.0 - 0.5 K/uL   Basophils Relative 0 %   Basophils Absolute 0.0 0.0 - 0.1 K/uL   Immature Granulocytes 1 %   Abs Immature Granulocytes 0.17 (H) 0.00 - 0.07 K/uL    Comment: Performed at [facility], [address] [place],  [address]  Comprehensive metabolic panel     Status: Abnormal   Collection Time: 07/17/2019  9:25 PM  Result Value Ref Range   Sodium 135 135 - 145 mmol/L   Potassium 4.6 3.5 - 5.1 mmol/L   Chloride 105 98 - 111 mmol/L   CO2 18 (L) 22 - 32 mmol/L   Glucose, Bld 161 (H) 70 - 99 mg/dL   BUN 51 (H) 8 - 23 mg/dL   Creatinine, Ser 1.82 (H) 0.61 - 1.24 mg/dL   Calcium 8.8 (L) 8.9 - 10.3 mg/dL    Total Protein 7.1 6.5 - 8.1 g/dL   Albumin 3.1 (L) 3.5 - 5.0 g/dL   AST 47 (H) 15 - 41 U/L   ALT 40 0 - 44 U/L   Alkaline Phosphatase 73 38 - 126 U/L   Total Bilirubin 0.9 0.3 - 1.2 mg/dL   GFR calc non Af Amer 35 (L) >60 mL/min   GFR calc Af Amer 41 (L) >60 mL/min   Anion gap 12 5 - 15    Comment: Performed at [facility], [place] [address], [place], [place] [address]  SARS CORONAVIRUS 2 (TAT 6-24 HRS) Nasopharyngeal Nasopharyngeal Swab     Status: None   Collection Time: 07/28/2019 11:00 PM   Specimen: Nasopharyngeal Swab  Result Value Ref Range   SARS Coronavirus 2 NEGATIVE NEGATIVE    Comment: (NOTE) SARS-CoV-2 target nucleic acids are NOT DETECTED. The SARS-CoV-2 RNA is generally detectable in upper and lower respiratory specimens during the acute phase of infection. Negative results do not preclude SARS-CoV-2 infection, do not rule out co-infections with other pathogens, and should not be used as the sole basis for treatment or other patient management decisions. Negative results must be combined with clinical observations, patient history, and epidemiological information. The expected result is Negative. Fact Sheet for Patients: SugarRoll.be Fact Sheet for Healthcare Providers: https://www.woods-mathews.com/ This test is  dating back to 2019. 3. Unchanged scattered mediastinal lymph nodes. 4. New age-indeterminate widening of the anterior interdisc space at T12-L1 which is partially visualized and concerning for fracture of the anterior flowing osteophyte. Would correlate with patient's site of pain and further evaluation is required, would recommend dedicated MRI Electronically Signed   By: Prudencio Pair M.D.   On: 07/31/2019 02:33   Ct Cervical Spine Wo Contrast  Result  Date: 07/15/2019 CLINICAL DATA:  78 year old male with ataxia status post fall backwards. EXAM: CT CERVICAL SPINE WITHOUT CONTRAST TECHNIQUE: Multidetector CT imaging of the cervical spine was performed without intravenous contrast. Multiplanar CT image reconstructions were also generated. COMPARISON:  Head CT today reported separately. FINDINGS: Alignment: Reversal of lower cervical lordosis. Cervicothoracic junction alignment is within normal limits. Bilateral posterior element alignment is within normal limits. Skull base and vertebrae: Visualized skull base is intact. No atlanto-occipital dissociation. Incidental prominent occipital bone arachnoid granulations (normal variant). No acute osseous abnormality identified. Soft tissues and spinal canal: No visible canal hematoma. There is prevertebral fluid or edema at the C3 level demonstrated on series 9, image 118. No adjacent vertebral fracture identified. Otherwise negative visible noncontrast neck soft tissues. Disc levels: There appears to be significant degenerative cervical spinal stenosis at most levels, including at least moderate suspected stenosis at C2-C3 (series 9, image 100). Lower cervical spinal stenosis is related to bulky disc osteophyte complex. Upper chest: T2 through T4 upper thoracic ankylosis. Grossly intact visible upper thoracic levels. Osteopenia. There is patchy nonspecific ground-glass opacity in the visible right upper lobe (series 5, images 87 and 103). Negative visible left lung apex. Negative visible noncontrast superior mediastinum. IMPRESSION: 1. No cervical spine fracture is identified, but mild prevertebral fluid raises the possibility of anterior ligamentous injury at C3. A noncontrast Cervical Spine MRI could evaluate further. 2. 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 This could predispose to blunt trauma to the cervical spinal cord in the setting of fall. 3. Nonspecific pulmonary ground-glass  opacity in the right upper lobe. Consider acute viral/atypical respiratory infection. 4. 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 Electronically Signed   By: Genevie Ann M.D.   On: 08/05/2019 22:49   Mr Cervical Spine Wo Contrast  Result Date: 07/22/2019 CLINICAL DATA:  Weakness, fall hit head EXAM: MRI CERVICAL SPINE WITHOUT CONTRAST TECHNIQUE: Multiplanar, multisequence MR imaging of the cervical spine was performed. No intravenous contrast was administered. COMPARISON:  None. FINDINGS: Alignment: There is reversal of the normal cervical lordosis. A grade 1 anterolisthesis of C4 on C5 is seen measuring 3 mm. Vertebrae: There is a focal disruption of the anterior longitudinal ligament seen at C5-C6 with fluid between the cleft. There is also a probable acutely fractured anterior osteophyte at the anterior inferior endplate of C5. Increased marrow signal seen at the anterior vertebral bodies of C5, C6, C7. There is increased signal seen between the inter spinous ligament at C5-C6 with surrounding paraspinal soft tissue edema. Cord: There is increased cord signal seen at the C5 level with mild cord expansion, likely acute cord contusion. No definite blood products seen within the cord. There is increased cord signal seen from C6 through C7 which is likely due to myelomalacia. There is also minimally increased cord signal seen at C2-C3, likely due to early myelomalacia. Posterior Fossa, vertebral arteries, paraspinal tissues: Significant prevertebral fluid and soft tissue edema is noted from C2 through C6. There is also paraspinal soft tissue edema seen posteriorly. The visualized portion  not yet approved or cleared by the [place] and  has been authorized for detection and/or diagnosis of SARS-CoV-2 by FDA under an Emergency Use Authorization (EUA). This EUA will remain  in effect (meaning this test can be used) for the duration of the COVID-19 declaration under Section 56 4(b)(1) of the Act, 21 U.S.C. section 360bbb-3(b)(1), unless the authorization is terminated or revoked sooner. Performed at [facility], [place] [address], [place], [place] [address]     Ct Head Wo Contrast  Result Date: 08/01/2019 CLINICAL DATA:  78 year old male with ataxia status post fall backwards.  EXAM: CT HEAD WITHOUT CONTRAST TECHNIQUE: Contiguous axial images were obtained from the base of the skull through the vertex without intravenous contrast. COMPARISON:  MiniCAT CT paranasal sinuses 01/12/2018. FINDINGS: Brain: There is disproportionate biparietal cerebral volume loss (series 6, image 37), but elsewhere cerebral volume appears normal for age. No cortical encephalomalacia identified. No midline shift, ventriculomegaly, mass effect, evidence of mass lesion, intracranial hemorrhage or evidence of cortically based acute infarction. Gray-white matter differentiation is within normal limits throughout the brain. Vascular: Calcified atherosclerosis at the skull base. No suspicious intracranial vascular hyperdensity. Skull: No acute osseous abnormality identified. Incidental prominent occipital arachnoid granulations on series 4, image 17 (normal variant). Sinuses/Orbits: Visualized paranasal sinuses and mastoids are well pneumatized today. Other: Mild posterior vertex scalp hematoma suspected (series 6, image 29). Underlying calvarium intact. Otherwise negative orbit and scalp soft tissues. IMPRESSION: 1. Mild posterior vertex scalp soft tissue injury without underlying fracture. 2.  No acute traumatic injury to the brain identified. 3. Suggestion of disproportionate biparietal cerebral volume loss, non-specific but can be seen with dementia. Electronically Signed   By: Genevie Ann M.D.   On: 07/16/2019 22:40   Ct Chest Wo Contrast  Result Date: 08/06/2019 CLINICAL DATA:  Shortness of breath EXAM: CT CHEST WITHOUT CONTRAST TECHNIQUE: Multidetector CT imaging of the chest was performed following the standard protocol without IV contrast. COMPARISON:  April 20, 2018 FINDINGS: Cardiovascular: Coronary artery calcifications are seen. Scattered aortic atherosclerosis. The heart size is normal. There is no pericardial thickening or effusion. Mediastinum/Nodes: Again noted are scattered mediastinal,  pretracheal and prevascular lymph nodes seen. The largest within the anterior subcarinal region measuring 1.3 cm as on the prior exam. The thyroid gland, trachea and esophagus demonstrate no significant findings. Lungs/Pleura: There is new ground-glass patchy airspace opacities within the left upper lobe and throughout the right lung, predominantly within the periphery. There is also new patchy areas of consolidation within the posterior bilateral lower lungs, right greater than left. There is unchanged nodular area of streaky opacity again noted at the left lung base and along the right middle lobe. There is no pleural effusion. Upper abdomen: The visualized portion of the upper abdomen is unremarkable. Musculoskeletal/Chest wall: There is widening of the anterior inter disc space at T12-L1 which is partially visualized with linear calcifications in the inter disc space. This was not seen on the prior exam and could be due to fracture of the anterior flowing osteophyte. Again noted are anterior flowing osteophytes seen throughout the thoracic spine. IMPRESSION: 1. New ground-glass patchy airspace opacities seen within the left upper lobe and throughout the right lung predominantly within the periphery. There is also streaky areas of consolidation in the posterior lower lungs. This is nonspecific, however could be due to atypical viral pneumonia. 2. Unchanged patchy rounded airspace nodules within the left lower lung base and right middle lobe as on prior CT  without atrophy or fasciculations.  Sensory: Sensation decreased but symmetric in BUE and BLE, nonspecific dermatomal pattern Deep Tendon Reflexes    - 3+ and symmetric in the biceps and patellae.   Results - Imaging/Labs   Results for orders placed or performed during the hospital encounter of 07/22/2019 (from the past 48 hour(s))  CBC with Differential     Status: Abnormal   Collection Time: 08/01/2019  9:25 PM  Result Value Ref Range   WBC 20.3 (H) 4.0 - 10.5 K/uL   RBC 4.11 (L) 4.22 - 5.81 MIL/uL   Hemoglobin 12.9 (L) 13.0 - 17.0 g/dL   HCT 40.4 39.0 - 52.0 %   MCV 98.3 80.0 - 100.0 fL   MCH 31.4 26.0 - 34.0 pg   MCHC 31.9 30.0 - 36.0 g/dL   RDW 14.4 11.5 - 15.5 %   Platelets 152 150 - 400 K/uL   nRBC 0.0 0.0 - 0.2 %   Neutrophils Relative % 88 %   Neutro Abs 17.9 (H) 1.7 - 7.7 K/uL   Lymphocytes Relative 6 %   Lymphs Abs 1.2 0.7 - 4.0 K/uL   Monocytes Relative 5 %   Monocytes Absolute 1.0 0.1 - 1.0 K/uL   Eosinophils Relative 0 %   Eosinophils Absolute 0.0 0.0 - 0.5 K/uL   Basophils Relative 0 %   Basophils Absolute 0.0 0.0 - 0.1 K/uL   Immature Granulocytes 1 %   Abs Immature Granulocytes 0.17 (H) 0.00 - 0.07 K/uL    Comment: Performed at [facility], [address] [place],  [address]  Comprehensive metabolic panel     Status: Abnormal   Collection Time: 07/17/2019  9:25 PM  Result Value Ref Range   Sodium 135 135 - 145 mmol/L   Potassium 4.6 3.5 - 5.1 mmol/L   Chloride 105 98 - 111 mmol/L   CO2 18 (L) 22 - 32 mmol/L   Glucose, Bld 161 (H) 70 - 99 mg/dL   BUN 51 (H) 8 - 23 mg/dL   Creatinine, Ser 1.82 (H) 0.61 - 1.24 mg/dL   Calcium 8.8 (L) 8.9 - 10.3 mg/dL    Total Protein 7.1 6.5 - 8.1 g/dL   Albumin 3.1 (L) 3.5 - 5.0 g/dL   AST 47 (H) 15 - 41 U/L   ALT 40 0 - 44 U/L   Alkaline Phosphatase 73 38 - 126 U/L   Total Bilirubin 0.9 0.3 - 1.2 mg/dL   GFR calc non Af Amer 35 (L) >60 mL/min   GFR calc Af Amer 41 (L) >60 mL/min   Anion gap 12 5 - 15    Comment: Performed at [facility], [place] [address], [place], [place] [address]  SARS CORONAVIRUS 2 (TAT 6-24 HRS) Nasopharyngeal Nasopharyngeal Swab     Status: None   Collection Time: 07/28/2019 11:00 PM   Specimen: Nasopharyngeal Swab  Result Value Ref Range   SARS Coronavirus 2 NEGATIVE NEGATIVE    Comment: (NOTE) SARS-CoV-2 target nucleic acids are NOT DETECTED. The SARS-CoV-2 RNA is generally detectable in upper and lower respiratory specimens during the acute phase of infection. Negative results do not preclude SARS-CoV-2 infection, do not rule out co-infections with other pathogens, and should not be used as the sole basis for treatment or other patient management decisions. Negative results must be combined with clinical observations, patient history, and epidemiological information. The expected result is Negative. Fact Sheet for Patients: SugarRoll.be Fact Sheet for Healthcare Providers: https://www.woods-mathews.com/ This test is  Patient taking differently: Take 50 mg by mouth daily.  01/14/19  Yes Briscoe Deutscher, DO  SPIRIVA RESPIMAT 2.5 MCG/ACT AERS INHALE 2 PUFFS INTO THE LUNGS DAILY. Patient taking differently: Inhale 2 puffs into the lungs every morning.  04/05/18  Yes Marin Olp, MD  Syringe, Disposable, 1 ML MISC Use with insulin needle. 02/19/17  Yes Briscoe Deutscher, DO  Travoprost, BAK Free, (TRAVATAN Z) 0.004 % SOLN ophthalmic solution Place 1 drop in both eyes once daily 05/13/19  Yes Briscoe Deutscher, DO  triamcinolone (KENALOG) 0.1 % paste Use as directed 1 application in the mouth or throat 2 (two) times daily. Patient taking differently: Use as directed 1 application in the mouth or throat 2 (two) times daily as needed (skin care).  01/11/19  Yes Briscoe Deutscher, DO  HYDROcodone-acetaminophen (NORCO) 10-325 MG tablet Take 1 tablet by mouth every 8 (eight) hours as needed. Patient not taking:  Reported on 08/10/2019 11/10/18   Briscoe Deutscher, DO    ALLERGY: No Known Allergies  Social History   Tobacco Use   Smoking status: Former Smoker    Packs/day: 0.10    Years: 2.00    Pack years: 0.20    Types: Cigarettes    Quit date: 12/31/2007    Years since quitting: 11.5   Smokeless tobacco: Former Systems developer   Tobacco comment: smokes 4-5 every other day  Substance Use Topics   Alcohol use: No     Family History  Problem Relation Age of Onset   Heart disease Mother    Diabetes Mother      ROS   Review of Systems  Constitutional: Negative.   HENT: Negative.   Eyes: Negative.   Respiratory: Negative.   Cardiovascular: Negative.   Gastrointestinal: Negative for nausea and vomiting.  Musculoskeletal: Positive for back pain, myalgias and neck pain.  Skin: Negative.   Neurological: Positive for tingling, sensory change, focal weakness and weakness. Negative for dizziness, tremors, speech change, loss of consciousness and headaches.    Exam   Vitals:   07/22/2019 0030 08/11/2019 0130  BP: (!) 117/54 (!) 117/57  Pulse: 60 61  Resp: 20 (!) 23  Temp:    SpO2: 100% 94%   General appearance: elderly male in [place] c collar Eyes: No scleral injection Cardiovascular: Regular rate and rhythm without murmurs, rubs, gallops. No edema or variciosities. Distal pulses normal. Pulmonary: Effort normal, non-labored breathing Musculoskeletal:     Muscle tone upper extremities: Normal    Muscle tone lower extremities: Normal    Motor exam: Upper Extremities Deltoid Bicep Tricep Grip  Right 4/5 4+/5 4+/5 3/5  Left 4/5 4+/5 4+/5 4-/5   Lower Extremity IP Quad PF DF EHL  Right 2/5 3/5 4+/5 4+/5 4+/5  Left 2/5 3/5 4-/5 4-/5 4-/5   Neurological Mental Status:    - Patient is awake, alert, oriented to person, place, month, year, and situation    - Patient is able to give a clear and coherent history.    - No signs of aphasia or neglect Cranial Nerves    - II: Visual  Fields are full. PERRL    - III/IV/VI: EOMI without ptosis or diploplia.     - V: Facial sensation is grossly normal    - VII: Facial movement is symmetric.     - VIII: hearing is intact to voice    - X: Uvula elevates symmetrically    - XI: Shoulder shrug is symmetric.    - XII: tongue is midline

## 2019-07-13 NOTE — ED Notes (Signed)
Pt to CT and MRI at this time

## 2019-07-13 NOTE — Anesthesia Postprocedure Evaluation (Signed)
Anesthesia Post Note  Patient: Lucas Price  Procedure(s) Performed: ANTERIOR CERVICAL DECOMPRESSION/DISCECTOMY FUSION CERVICAL FIVE-SIX, CERVICAL SIX-SEVEN, CERVICAL SEVEN-THORACIC ONE (N/A Spine Cervical)     Patient location during evaluation: PACU Anesthesia Type: General Level of consciousness: sedated Pain management: pain level controlled Vital Signs Assessment: post-procedure vital signs reviewed and stable Respiratory status: spontaneous breathing and respiratory function stable Cardiovascular status: stable Postop Assessment: no apparent nausea or vomiting Anesthetic complications: no    Last Vitals:  Vitals:   07/19/2019 1937 07/19/2019 2000  BP: (!) 140/53 (!) 97/50  Pulse: 68 65  Resp: 12 18  Temp:  (!) 36.4 C  SpO2: 92% 99%    Last Pain:  Vitals:   07/20/2019 2000  TempSrc: Axillary  PainSc:                  Ranae Casebier DANIEL

## 2019-07-13 NOTE — Anesthesia Procedure Notes (Signed)
Procedure Name: Intubation Date/Time: 07/18/2019 2:23 PM Performed by: Janace Litten, CRNA Pre-anesthesia Checklist: Patient identified, Emergency Drugs available, Suction available and Patient being monitored Patient Re-evaluated:Patient Re-evaluated prior to induction Oxygen Delivery Method: Circle System Utilized Preoxygenation: Pre-oxygenation with 100% oxygen Induction Type: IV induction, Rapid sequence and Cricoid Pressure applied Laryngoscope Size: Glidescope and 3 Grade View: Grade I Tube type: Oral Tube size: 7.5 mm Number of attempts: 1 Airway Equipment and Method: Stylet Placement Confirmation: ETT inserted through vocal cords under direct vision,  positive ETCO2 and breath sounds checked- equal and bilateral Secured at: 22 cm Tube secured with: Tape Dental Injury: Teeth and Oropharynx as per pre-operative assessment  Comments: 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 C-collar remained secured in place for induction and intubation.

## 2019-07-14 ENCOUNTER — Inpatient Hospital Stay (HOSPITAL_COMMUNITY): Payer: Medicare Other

## 2019-07-14 DIAGNOSIS — S14129A Central cord syndrome at unspecified level of cervical spinal cord, initial encounter: Secondary | ICD-10-CM | POA: Diagnosis not present

## 2019-07-14 DIAGNOSIS — I9581 Postprocedural hypotension: Secondary | ICD-10-CM | POA: Diagnosis not present

## 2019-07-14 LAB — CBC
HCT: 31.3 % — ABNORMAL LOW (ref 39.0–52.0)
Hemoglobin: 10 g/dL — ABNORMAL LOW (ref 13.0–17.0)
MCH: 31.7 pg (ref 26.0–34.0)
MCHC: 31.9 g/dL (ref 30.0–36.0)
MCV: 99.4 fL (ref 80.0–100.0)
Platelets: 112 10*3/uL — ABNORMAL LOW (ref 150–400)
RBC: 3.15 MIL/uL — ABNORMAL LOW (ref 4.22–5.81)
RDW: 14.7 % (ref 11.5–15.5)
WBC: 13.7 10*3/uL — ABNORMAL HIGH (ref 4.0–10.5)
nRBC: 0 % (ref 0.0–0.2)

## 2019-07-14 LAB — COMPREHENSIVE METABOLIC PANEL
ALT: 30 U/L (ref 0–44)
AST: 39 U/L (ref 15–41)
Albumin: 2.8 g/dL — ABNORMAL LOW (ref 3.5–5.0)
Alkaline Phosphatase: 52 U/L (ref 38–126)
Anion gap: 10 (ref 5–15)
BUN: 58 mg/dL — ABNORMAL HIGH (ref 8–23)
CO2: 22 mmol/L (ref 22–32)
Calcium: 8.1 mg/dL — ABNORMAL LOW (ref 8.9–10.3)
Chloride: 107 mmol/L (ref 98–111)
Creatinine, Ser: 2 mg/dL — ABNORMAL HIGH (ref 0.61–1.24)
GFR calc Af Amer: 36 mL/min — ABNORMAL LOW (ref >60)
GFR calc non Af Amer: 31 mL/min — ABNORMAL LOW (ref >60)
Glucose, Bld: 189 mg/dL — ABNORMAL HIGH (ref 70–99)
Potassium: 5.4 mmol/L — ABNORMAL HIGH (ref 3.5–5.1)
Sodium: 139 mmol/L (ref 135–145)
Total Bilirubin: 0.5 mg/dL (ref 0.3–1.2)
Total Protein: 5.8 g/dL — ABNORMAL LOW (ref 6.5–8.1)

## 2019-07-14 LAB — BASIC METABOLIC PANEL
Anion gap: 9 (ref 5–15)
BUN: 61 mg/dL — ABNORMAL HIGH (ref 8–23)
CO2: 18 mmol/L — ABNORMAL LOW (ref 22–32)
Calcium: 7.5 mg/dL — ABNORMAL LOW (ref 8.9–10.3)
Chloride: 110 mmol/L (ref 98–111)
Creatinine, Ser: 2 mg/dL — ABNORMAL HIGH (ref 0.61–1.24)
GFR calc Af Amer: 36 mL/min — ABNORMAL LOW (ref >60)
GFR calc non Af Amer: 31 mL/min — ABNORMAL LOW (ref >60)
Glucose, Bld: 149 mg/dL — ABNORMAL HIGH (ref 70–99)
Potassium: 6 mmol/L — ABNORMAL HIGH (ref 3.5–5.1)
Sodium: 137 mmol/L (ref 135–145)

## 2019-07-14 LAB — PROTIME-INR
INR: 1.2 (ref 0.8–1.2)
Prothrombin Time: 15.2 seconds (ref 11.4–15.2)

## 2019-07-14 LAB — PHOSPHORUS: Phosphorus: 5.9 mg/dL — ABNORMAL HIGH (ref 2.5–4.6)

## 2019-07-14 LAB — TROPONIN I (HIGH SENSITIVITY): Troponin I (High Sensitivity): 9 ng/L (ref <18)

## 2019-07-14 LAB — APTT: aPTT: 30 seconds (ref 24–36)

## 2019-07-14 LAB — GLUCOSE, CAPILLARY
Glucose-Capillary: 151 mg/dL — ABNORMAL HIGH (ref 70–99)
Glucose-Capillary: 140 mg/dL — ABNORMAL HIGH (ref 70–99)
Glucose-Capillary: 132 mg/dL — ABNORMAL HIGH (ref 70–99)
Glucose-Capillary: 132 mg/dL — ABNORMAL HIGH (ref 70–99)

## 2019-07-14 LAB — MAGNESIUM: Magnesium: 2.3 mg/dL (ref 1.7–2.4)

## 2019-07-14 LAB — POTASSIUM: Potassium: 6.1 mmol/L — ABNORMAL HIGH (ref 3.5–5.1)

## 2019-07-14 LAB — LACTIC ACID, PLASMA: Lactic Acid, Venous: 1.6 mmol/L (ref 0.5–1.9)

## 2019-07-14 MED ORDER — PHENYLEPHRINE HCL-NACL 10-0.9 MG/250ML-% IV SOLN
0.0000 ug/min | INTRAVENOUS | Status: DC
  Administered 2019-07-14: 35 ug/min via INTRAVENOUS
  Administered 2019-07-14: 26 ug/min via INTRAVENOUS
  Administered 2019-07-14 – 2019-07-15 (×2): 15 ug/min via INTRAVENOUS
  Administered 2019-07-15: 80 ug/min via INTRAVENOUS
  Administered 2019-07-15 (×2): 100 ug/min via INTRAVENOUS
  Administered 2019-07-16: 140 ug/min via INTRAVENOUS
  Administered 2019-07-16: 80 ug/min via INTRAVENOUS
  Administered 2019-07-16: 60 ug/min via INTRAVENOUS
  Administered 2019-07-16: 13.333 ug/min via INTRAVENOUS
  Administered 2019-07-16: 70 ug/min via INTRAVENOUS
  Filled 2019-07-14: qty 500
  Filled 2019-07-14 (×6): qty 250
  Filled 2019-07-14: qty 500
  Filled 2019-07-14 (×4): qty 250
  Filled 2019-07-14: qty 500

## 2019-07-14 MED ORDER — SODIUM CHLORIDE 0.9 % IV SOLN
INTRAVENOUS | Status: DC
  Administered 2019-07-14 – 2019-07-15 (×5): via INTRAVENOUS

## 2019-07-14 MED ORDER — SODIUM ZIRCONIUM CYCLOSILICATE 10 G PO PACK
10.0000 g | PACK | Freq: Once | ORAL | Status: DC
  Filled 2019-07-14: qty 1

## 2019-07-14 MED ORDER — HYDROMORPHONE HCL 1 MG/ML IJ SOLN
0.2500 mg | INTRAMUSCULAR | Status: DC | PRN
  Administered 2019-07-14: 0.25 mg via INTRAVENOUS
  Filled 2019-07-14: qty 1

## 2019-07-14 MED ORDER — FLUTICASONE PROPIONATE 50 MCG/ACT NA SUSP
1.0000 | NASAL | Status: DC | PRN
  Filled 2019-07-14: qty 16

## 2019-07-14 MED ORDER — CHLORHEXIDINE GLUCONATE 0.12 % MT SOLN
15.0000 mL | Freq: Two times a day (BID) | OROMUCOSAL | Status: DC
  Administered 2019-07-14 – 2019-07-15 (×4): 15 mL via OROMUCOSAL
  Filled 2019-07-14: qty 15

## 2019-07-14 MED ORDER — SODIUM CHLORIDE 0.9 % IV BOLUS
500.0000 mL | Freq: Once | INTRAVENOUS | Status: AC
  Administered 2019-07-14: 500 mL via INTRAVENOUS

## 2019-07-14 MED ORDER — CALCIUM GLUCONATE-NACL 1-0.675 GM/50ML-% IV SOLN
1.0000 g | Freq: Once | INTRAVENOUS | Status: AC
  Administered 2019-07-14: 1000 mg via INTRAVENOUS
  Filled 2019-07-14: qty 50

## 2019-07-14 MED ORDER — DEXTROSE 50 % IV SOLN
25.0000 mL | Freq: Once | INTRAVENOUS | Status: AC
  Administered 2019-07-14: 25 mL via INTRAVENOUS
  Filled 2019-07-14: qty 50

## 2019-07-14 MED ORDER — SALINE SPRAY 0.65 % NA SOLN
1.0000 | NASAL | Status: DC | PRN
  Administered 2019-07-14 – 2019-07-15 (×2): 1 via NASAL
  Filled 2019-07-14: qty 44

## 2019-07-14 MED ORDER — MORPHINE SULFATE (PF) 2 MG/ML IV SOLN
1.0000 mg | INTRAVENOUS | Status: DC | PRN
  Administered 2019-07-14: 1 mg via INTRAVENOUS
  Administered 2019-07-15: 2 mg via INTRAVENOUS
  Filled 2019-07-14 (×2): qty 1

## 2019-07-14 MED ORDER — ORAL CARE MOUTH RINSE
15.0000 mL | Freq: Two times a day (BID) | OROMUCOSAL | Status: DC
  Administered 2019-07-14 – 2019-07-15 (×3): 15 mL via OROMUCOSAL

## 2019-07-14 MED ORDER — INSULIN ASPART 100 UNIT/ML ~~LOC~~ SOLN
10.0000 [IU] | Freq: Once | SUBCUTANEOUS | Status: AC
  Administered 2019-07-14: 10 [IU] via SUBCUTANEOUS

## 2019-07-14 MED FILL — Thrombin For Soln 5000 Unit: CUTANEOUS | Qty: 5000 | Status: AC

## 2019-07-14 MED FILL — Gelatin Absorbable MT Powder: OROMUCOSAL | Qty: 1 | Status: AC

## 2019-07-14 NOTE — Progress Notes (Signed)
   07/14/19 0504  Vitals  BP (!) 67/38  MAP (mmHg) (!) 49  Pulse Rate 72  ECG Heart Rate 79  Resp 18  Oxygen Therapy  SpO2 93 %  MEWS Score  MEWS RR 0  MEWS Pulse 0  MEWS Systolic 3  MEWS LOC 1  MEWS Temp 0  MEWS Score 4  MEWS Score Color Red  Provider Notification  Provider Name/Title Dr. Christella Noa  Date Provider Notified 07/14/19  Time Provider Notified [phone]  Notification Type Page  Notification Reason Change in status (BP low)  Response See new orders (576mL bolus, CCM consult)  Date of Provider Response 07/14/19  Time of Provider Response [phone]

## 2019-07-14 NOTE — Progress Notes (Signed)
SLP Cancellation Note  Patient Details Name: Lucas Price MRN: [id number] DOB: 1941-01-02   Cancelled treatment:       Reason Eval/Treat Not Completed: Medical issues which prohibited therapy. RN states pt is being intubated very soon this am. Will discharge. Please reconsult when appropriate as pt will need dysphagia assessment at that time.    Houston Siren 07/14/2019, 9:31 AM    Orbie Pyo Colvin Caroli.Ed Risk analyst [phone] Office [phone]

## 2019-07-14 NOTE — Progress Notes (Signed)
  NEUROSURGERY PROGRESS NOTE   Hypotension post op. Patient transferred to ICU. CCM consulted. Complains of appropriate neck soreness and dysphagia. Unable to tolerate po without feeling as if he is going to choke. Denies worsening paresthesias, motor function  EXAM:  BP (!) 93/48   Pulse 67   Temp (!) 96.2 F (35.7 C) (Axillary) Comment: RN was notified  Resp 14   Ht 5\' 3"  (1.6 m)   Wt 78.5 kg   SpO2 98%   BMI 30.66 kg/m   Awake, alert, oriented  Speech fluent, appropriate  RUE: 4/4+ proximally, 2+/5 grip LUE: 4/4+ proximally, 3/5 grip RLE:  2/5 IP, 3/5 quad, 3/5 distally LLE: 2/5 IP, 4-/5 quad, 4/5 distally Incision: bruising, minimal swelling. No active drainage  IMPRESSION/PLAN 78 y.o. 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 Post op complicated by hypotension. Overall stable neurologically. Unable to tolerate po due to dysphagia. T12-L1 chance fracture. - Central cord syndrome: s/p decompression and fusion. continue aspen collar - dysphagia: SLP consult - hypotension: per CCM. Appreciate assistance - T12-L1 chance type fracture: T/L fusion scheduled tomorrow. CT done. Will see how his does today before determining if we need to cancel surgery tomorrow.

## 2019-07-14 NOTE — Progress Notes (Addendum)
PT Cancellation Note  Patient Details Name: Lucas Price MRN: [id number] DOB: 1941-04-14   Cancelled Treatment:    Reason Eval/Treat Not Completed: Patient not medically ready.  Pt has been hypotensive post surgery this morning.  Will check back later to see if pt more appropriate and see as able. 07/14/2019  Donnella Sham, [place] [phone]  (pager) [phone]  (office)   Lucas Price 07/14/2019, 1:39 PM

## 2019-07-14 NOTE — Progress Notes (Signed)
Patient consulted by PCCM early this am for hypotension post surgery. Initially b/p improved with IV hydration. Called to room by bedside nurse this morning stating b/p has dropped again with a sbp in the 50's, pressor support started prior to arrival in room. On assessment patient is seen with very poor ineffective cough with episodes of anxiety and with each cough due to feelings of air hunger. With elevation in [facility] approved by neurosurgery patient reports slight improvement of cough and anxiety.  -Will add chest PT and Meta nebs -Flutter valve  -Keep HOB elevated -Supplemental oxygen -Close monitoring of respiratory status low threshold for intubation  -Continue pressor support

## 2019-07-14 NOTE — Progress Notes (Signed)
Patient transferred to the ICU early 12/3 for hypotension requiring pressors.   [facility] will sign off for now, discussed with Dr. Lynetta Mare, PCCM.   Please do not hesitate to reconsult if needed.   Thank you, Marva Panda, DO Pager: [phone]

## 2019-07-14 NOTE — Consult Note (Signed)
NAME:  Lucas Price, MRN:  [id number], DOB:  01/17/1941, LOS: 1 ADMISSION DATE:  07/19/2019, CONSULTATION DATE:  12/3 REFERRING MD:  Dr. Christella Noa, CHIEF COMPLAINT:  Hypotension   Brief History   78 year old male admitted 12/1 after fall and found to have central cord syndrome with stenosis C5-T1. Taken urgently for decompression 12/2. In the early AM hours he became hypotensive and PCCM was consulted.   History of present illness   78 year old male with PMH as below, which is significant for COPD on as needed home O2, sleep apnea on nocturnal BiPAP, CAD, DM, hypertension, and CKD.  He reports poor activity tolerance at baseline.  The patient is a retired Film/video editor.  He was in his usual state of health until the late p.m. hours of 12/1 when he fell while climbing stairs and claims he struck his head.  Presented to the emergency department with complaints of bilateral upper extremity pain.  He was also having paresthesias in bilateral upper extremities with weakness more pronounced in the bilateral lower extremities.  He had some borderline hypotension which improved with 1 L of crystalloid.  CT scan the emergency department was concerning for ligamentous injury at C3. An MRI of cervical and lumbar spine were subsequently ordered. C spine MRI significant for cervical ligament injury, cervical stenosis, cord contusion. L spine MRI significant for T12-L1 disc space fracture.  He was diagnosed with central cord syndrome at the level of C5-C6 and C6-C7 and was taken urgently to the operating room for repair by Dr. Kathyrn Sheriff 12/2. The procedure was uncomplicated. Then in the early AM hours of 12/3 he was given hydromorphone for pain and was taken for follow up CT. Shortly after he developed hypotension. PCCM consulted.   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  Significant Hospital Events   12/1 fall while climbing stairs, present to ED 12/2 found to have central cord syndrome, taken to OR for decompression.  12/3 Hypotensive, PCCM consult.   Consults:  Cardiology NSG PCCM  Procedures:  12/3: ACDF C5-T1  Significant Diagnostic Tests:  CT head Cspine 12/1 > Mild posterior vertex scalp soft tissue injury without underlying fracture. No acute traumatic injury to the brain identified. Suggestion of disproportionate biparietal cerebral volume loss, non-specific but can be seen with dementia. CT chest 12/2 > new GGO in [place] and throughout R lung. Unchanged nodules from 2019. New age-indeterminate widening of the anterior interdisc space at T12-L1 which is partially visualized and concerning for fracture of the anterior flowing osteophyte. MRI cervical spine 12/2 > Acute disruption of the anterior longitudinal ligament at C5-C6 with a acute fracture of the anterior inferior endplate of the C5 osteophyte . Significant prevertebral soft tissue edema and fluid is seen extending from C2 through C6. Marrow edema/osseous contusions involving the C5-C7 anterior vertebral bodies. Interspinous ligamentous injury at C5-C6. Findings suggestive of acute cord contusion seen at C5 with cord edema. No definite epidural hematoma or hemorrhage. 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 MRI lumbar spine 12/2 > Acute fracture through ankylosed spine at the T12-L1 disc space. Disrupted anterior longitudinal ligament, disc, and propagation of the fracture through the posterior elements including the L1 spinous process. This is most likely an unstable injury. Associated paraspinal muscle injury, including a small intramuscular hematoma suspected along the lateral right erector spinae fibers at L2-L3. Transitional lumbosacral anatomy, with sacralized L5 level and vestigial L5-S1 disc space. Superimposed lumbar spine degeneration: advanced lumbar disc and endplate disease with multilevel severe lateral recess stenosis, moderate L3-L4 spinal stenosis.  Micro Data:    Antimicrobials:  CTX 12/1 > Azithromycin 12/1 >  Interim history/subjective:  Continues to complain of upper arm pain since admission. Otherwise no complaints of chest pain, dyspnea, dizziness. He tells me the highest his BP ever is, is about 158mmHg, typically 110.   Objective   Blood pressure (!) 72/42, pulse 69, temperature (!) 97.4 F (36.3 C), temperature source Axillary, resp. rate 20, height 5\' 3"  (1.6 m), weight 78.5 kg, SpO2 96 %.        Intake/Output Summary (Last 24 hours) at 07/14/2019 0545 Last data filed at 07/14/2019 0400 Gross per 24 hour  Intake 2010.54 ml  Output 1150 ml  Net 860.54 ml   Filed Weights   08/10/2019 1901 07/15/2019 1355 07/25/2019 1937  Weight: 80.7 kg 80.7 kg 78.5 kg    Examination: General: Elderly adult male in NAD HENT: Cervical collar in place Lungs: Clear bilateral breath sounds Cardiovascular: RRR, no MRG Abdomen: large abdominal girth, soft, non-tender.  Extremities: NO acute deformity Neuro: Alert and oriented. Weakness in all extremities seems to persist.   Resolved Hospital Problem list     Assessment & Plan:    Hypotension:  Likely a result of dilaudid administration and poor PO intake over the last 24-48 hours, although cannot rule out some component of neurogenic shock which seems less probable. EKG non-acute.  - 1L NS bolus - If needed after bolus, phenylephrine infusion to keep MAP > 39mmHg - Check lactic acid, troponin - Decrease PRN dilaudid dose.  - Hold metoprolol  Central cord syndrome at the level of the cervical spine secondary to fall.  T12-L1 fracture - Management per neurosurgery  - s/p ACDF cervical 12/2 - Still requires second stage of surgical intervention  CAP: - Ceftriaxone - Azithromycin - Trend WBC and fever curve.  - Follow cultures - Inflammatory markers pending  COPD without acute exacerbation - Continue incruse, dulera - May need to transition to nebs if he cannot mount deep inspiration for inhaler use.   Urinary retention - Foley  Hyperkalemia; 5.4 this morning Hyperphos - Repeat after volume given - May need diuresis if BP can hold vs give lokelma  AKI on CKD - repeat BMP after volume given  IDDM - SSI - Lantus  CAD history - cardiology following  Best practice:  Diet: NPO Pain/Anxiety/Delirium protocol (if indicated): NA VAP protocol (if indicated): NA DVT prophylaxis: per primary GI prophylaxis: per primary Glucose control: SSI Mobility: BR Code Status: FULL Family Communication: Patient updated Disposition: ICU  Labs   CBC: Recent Labs  Lab 07/16/2019 2125 07/18/2019 1323 07/14/19 0427  WBC 20.3* 12.1* 13.7*  NEUTROABS 17.9*  --   --   HGB 12.9* 10.8* 10.0*  HCT 40.4 34.1* 31.3*  MCV 98.3 99.1 99.4  PLT 152  151 112*    Basic Metabolic Panel: Recent Labs  Lab 08/03/2019 2125 08/01/2019 1323 07/14/19 0427  NA 135 134* 139  K 4.6 5.1 5.4*  CL 105 106 107  CO2 18* 19* 22  GLUCOSE 161* 150* 189*  BUN 51* 55* 58*  CREATININE 1.82* 1.89* 2.00*  CALCIUM 8.8* 8.2* 8.1*  MG  --   --  2.3  PHOS  --   --  5.9*    GFR: Estimated Creatinine Clearance: 29.1 mL/min (A) (by C-G formula based on SCr of 2 mg/dL (H)). Recent Labs  Lab 07/31/2019 2125 07/20/2019 1323 07/14/19 0427  PROCALCITON  --  0.45  --   WBC 20.3* 12.1* 13.7*    Liver Function Tests: Recent Labs  Lab 07/31/2019 2125 07/14/19 0427  AST 47* 39  ALT 40 30  ALKPHOS 73 52  BILITOT 0.9 0.5  PROT 7.1 5.8*  ALBUMIN 3.1* 2.8*   No results for input(s): LIPASE, AMYLASE in the last 168 hours. No results for input(s): AMMONIA in the last 168 hours.  ABG    Component Value Date/Time   PHART 7.407 07/22/2017 1455   PCO2ART 42.6 07/22/2017 1455   PO2ART 55.1 (L) 07/22/2017 1455   HCO3 26.3 07/22/2017 1455   O2SAT 87.9 07/22/2017 1455     Coagulation Profile: Recent Labs  Lab 07/14/19 0427  INR 1.2    Cardiac Enzymes: No results for input(s): CKTOTAL, CKMB, CKMBINDEX, TROPONINI in the last 168 hours.  HbA1C: Hemoglobin A1C  Date/Time Value Ref Range Status  05/30/2019 02:01 PM 5.1 4.0 - 5.6 % Final   Hgb A1c MFr Bld  Date/Time Value Ref Range Status  01/11/2019 03:27 PM 6.8 (H) 4.6 - 6.5 % Final    Comment:    Glycemic Control Guidelines for People with Diabetes:Non Diabetic:  <6%Goal of Therapy: <7%Additional Action Suggested:  >8%   09/14/2018 03:00 PM 5.7 4.6 - 6.5 % Final    Comment:    Glycemic Control Guidelines for People with Diabetes:Non Diabetic:  <6%Goal of Therapy: <7%Additional Action Suggested:  >8%     CBG: Recent Labs  Lab 08/07/2019 1229 07/21/2019 1338 07/16/2019 2123  GLUCAP 166* 143* 153*    Review of Systems:    Bolds are positive  Constitutional: weight loss, gain, night sweats, Fevers, chills, fatigue .  HEENT: headaches, Sore throat, sneezing, nasal congestion, post nasal drip, Difficulty swallowing, Tooth/dental problems, visual complaints visual changes, ear ache CV:  chest pain, radiates:,Orthopnea, PND, swelling in lower extremities, dizziness, palpitations, syncope.  GI  heartburn,  indigestion, abdominal pain, nausea, vomiting, diarrhea, change in bowel habits, loss of appetite, bloody stools.  Resp: cough, productive: , hemoptysis, dyspnea, chest pain, pleuritic.  Skin: rash or itching or icterus GU: dysuria, change in color of urine, urgency or frequency. flank pain, hematuria  MS: upper extremity pain or swelling. decreased range of motion  Psych: change in mood or affect. depression or anxiety.  Neuro: difficulty with speech, weakness, numbness, ataxia    Past Medical History  He,  has a past medical history of Anemia (05/26/2014), Bladder neck obstruction (04/24/2014), Chronic respiratory failure with hypoxia ([place]), CKD (chronic kidney disease) (05/26/2014), COPD (chronic obstructive pulmonary disease) ([place]) (05/26/2014), Coronary artery calcification seen on CAT scan, Diabetes mellitus ([place]) (05/26/2014), Former smoker (05/26/2014), GERD (gastroesophageal reflux disease) (05/26/2014), Glaucoma, Heart murmur, HTN (hypertension) (05/26/2014), Ischemic heart disease, Lung nodules, 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   Surgical History    Past Surgical History:  Procedure Laterality Date   BLADDER NECK RECONSTRUCTION     CATARACT EXTRACTION, BILATERAL     COLONOSCOPY WITH PROPOFOL N/A 09/22/2017   Procedure: COLONOSCOPY WITH PROPOFOL;  Surgeon: Doran Stabler, MD;  Location: [facility];  Service: Gastroenterology;  Laterality: N/A;   HERNIA REPAIR Bilateral    Inguinal   VIDEO BRONCHOSCOPY WITH ENDOBRONCHIAL NAVIGATION N/A 10/07/2017   Procedure: VIDEO BRONCHOSCOPY WITH ENDOBRONCHIAL NAVIGATION;  Surgeon: Collene Gobble, MD;  Location: [facility] OR;  Service: Thoracic;  Laterality: N/A;   VIDEO BRONCHOSCOPY WITH ENDOBRONCHIAL ULTRASOUND N/A 10/07/2017   Procedure: VIDEO BRONCHOSCOPY  WITH ENDOBRONCHIAL ULTRASOUND;  Surgeon: Collene Gobble, MD;  Location: [facility] OR;  Service: Thoracic;  Laterality: N/A;     Social History   reports that he quit smoking about 11 years ago. His smoking use included cigarettes. He has a 0.20 pack-year smoking history. He has quit using smokeless tobacco. He reports that he does not drink alcohol or use drugs.   Family History   His family history includes Diabetes in his mother; Heart disease in his mother.   Allergies No Known Allergies   Home Medications  Prior to Admission medications   Medication Sig Start Date End Date Taking? Authorizing Provider  allopurinol (ZYLOPRIM) 100 MG tablet Take 1 tablet (100 mg total) by mouth daily. 01/14/19  Yes Briscoe Deutscher, DO  aspirin EC 81 MG tablet Take 81 mg by mouth daily.    Yes [provider]  atorvastatin (LIPITOR) 10 MG tablet TAKE 1 TABLET EVERY DAY Patient taking differently: Take 10 mg by mouth every morning.  05/27/19  Yes Miquel Dunn, NP  budesonide-formoterol [facility]) 160-4.5 MCG/ACT inhaler Inhale 2 puffs into the lungs 2 (two) times daily. Patient taking differently: Inhale 2 puffs into the lungs every morning.  04/01/18  Yes Brand Males, MD  calcium carbonate (CALCIUM 600) 600 MG TABS tablet Take 600 mg by mouth daily.    Yes [provider]  Cholecalciferol (D 10000) 10000 units CAPS Take 10,000 Units by mouth every other day.    Yes [provider]  Coenzyme Q-10 200 MG CAPS Take 200 mg by mouth daily.    Yes [provider]  dorzolamide (TRUSOPT) 2 % ophthalmic solution Place 1 drop into both eyes every morning.  10/29/17  Yes [provider]  Insulin Syringe-Needle U-100 (BD INSULIN SYRINGE ULTRAFINE) 31G X 5/16" 0.5 ML MISC 1 each by Does not apply route daily. 08/23/18  Yes Briscoe Deutscher, DO  ketorolac (ACULAR) 0.5 % ophthalmic solution Place 1 drop into both eyes 3 (three) times daily.  06/02/18  Yes [provider]  LANTUS 100 UNIT/ML injection INJECT  50 UNITS SUBCUTANEOUSLY AT BEDTIME Patient taking differently: Inject 20 Units into the skin at bedtime.  07/10/19  Yes Leamon Arnt, MD  latanoprost (XALATAN) 0.005 % ophthalmic solution Place 1 drop into both eyes at bedtime.  05/11/19  Yes [provider]  metoprolol succinate (TOPROL-XL) 50 MG 24 hr tablet TAKE 1 TABLET EVERY DAY Patient taking differently: Take 50 mg by mouth daily.  06/13/19  Yes Marin Olp, MD  OXYGEN Inhale 1.5 L into the lungs continuous.    Yes [provider]  sitaGLIPtin (JANUVIA) 50 MG tablet 1 tab daily Patient taking differently: Take 50 mg by mouth daily.  01/14/19  Yes Briscoe Deutscher, Delta  2.5 MCG/ACT AERS INHALE 2 PUFFS INTO THE LUNGS DAILY. Patient taking differently: Inhale 2 puffs into the lungs every morning.  04/05/18  Yes Marin Olp, MD  Syringe, Disposable, 1 ML MISC Use with insulin needle. 02/19/17  Yes Briscoe Deutscher, DO  Travoprost, BAK Free, (TRAVATAN Z) 0.004 % SOLN ophthalmic solution Place 1 drop in both eyes once daily 05/13/19  Yes Briscoe Deutscher, DO  triamcinolone (KENALOG) 0.1 % paste Use as directed 1 application in the mouth or throat 2 (two) times daily. Patient taking differently: Use as directed 1 application in the mouth or throat 2 (two) times daily as needed (skin care).  01/11/19  Yes Briscoe Deutscher, DO  HYDROcodone-acetaminophen (NORCO) 10-325 MG tablet Take 1 tablet by mouth every 8 (eight) hours as needed. Patient not taking: Reported on 08/11/2019 11/10/18   B[place]coe Deutscher, DO     Critical care time: 40 mins     Georgann Housekeeper, AGACNP-BC [place] for personal pager PCCM on call pager [phone]  07/14/2019 6:37 AM

## 2019-07-15 ENCOUNTER — Inpatient Hospital Stay (HOSPITAL_COMMUNITY): Payer: Medicare Other | Admitting: Anesthesiology

## 2019-07-15 ENCOUNTER — Inpatient Hospital Stay (HOSPITAL_COMMUNITY): Payer: Medicare Other

## 2019-07-15 ENCOUNTER — Encounter (HOSPITAL_COMMUNITY): Payer: Self-pay

## 2019-07-15 ENCOUNTER — Inpatient Hospital Stay (HOSPITAL_COMMUNITY): Admission: EM | Disposition: E | Payer: Self-pay | Source: Home / Self Care | Attending: Neurosurgery

## 2019-07-15 DIAGNOSIS — S14129A Central cord syndrome at unspecified level of cervical spinal cord, initial encounter: Secondary | ICD-10-CM

## 2019-07-15 DIAGNOSIS — I9581 Postprocedural hypotension: Secondary | ICD-10-CM | POA: Diagnosis not present

## 2019-07-15 HISTORY — PX: POSTERIOR LUMBAR FUSION 4 LEVEL: SHX6037

## 2019-07-15 HISTORY — PX: APPLICATION OF ROBOTIC ASSISTANCE FOR SPINAL PROCEDURE: SHX6753

## 2019-07-15 LAB — POCT I-STAT 7, (LYTES, BLD GAS, ICA,H+H)
Acid-base deficit: 14 mmol/L — ABNORMAL HIGH (ref 0.0–2.0)
Acid-base deficit: 12 mmol/L — ABNORMAL HIGH (ref 0.0–2.0)
Bicarbonate: 14.3 mmol/L — ABNORMAL LOW (ref 20.0–28.0)
Bicarbonate: 14 mmol/L — ABNORMAL LOW (ref 20.0–28.0)
Calcium, Ion: 1.24 mmol/L (ref 1.15–1.40)
Calcium, Ion: 1.16 mmol/L (ref 1.15–1.40)
HCT: 28 % — ABNORMAL LOW (ref 39.0–52.0)
HCT: 29 % — ABNORMAL LOW (ref 39.0–52.0)
Hemoglobin: 9.5 g/dL — ABNORMAL LOW (ref 13.0–17.0)
Hemoglobin: 9.9 g/dL — ABNORMAL LOW (ref 13.0–17.0)
O2 Saturation: 99 %
O2 Saturation: 90 %
Patient temperature: 97.5
Patient temperature: 94.4
Potassium: 5.2 mmol/L — ABNORMAL HIGH (ref 3.5–5.1)
Potassium: 5.1 mmol/L (ref 3.5–5.1)
Sodium: 142 mmol/L (ref 135–145)
Sodium: 145 mmol/L (ref 135–145)
TCO2: 16 mmol/L — ABNORMAL LOW (ref 22–32)
TCO2: 15 mmol/L — ABNORMAL LOW (ref 22–32)
pCO2 arterial: 42.2 mmHg (ref 32.0–48.0)
pCO2 arterial: 29.4 mmHg — ABNORMAL LOW (ref 32.0–48.0)
pH, Arterial: 7.135 — CL (ref 7.350–7.450)
pH, Arterial: 7.272 — ABNORMAL LOW (ref 7.350–7.450)
pO2, Arterial: 179 mmHg — ABNORMAL HIGH (ref 83.0–108.0)
pO2, Arterial: 57 mmHg — ABNORMAL LOW (ref 83.0–108.0)

## 2019-07-15 LAB — BASIC METABOLIC PANEL
Anion gap: 7 (ref 5–15)
Anion gap: 12 (ref 5–15)
BUN: 57 mg/dL — ABNORMAL HIGH (ref 8–23)
BUN: 58 mg/dL — ABNORMAL HIGH (ref 8–23)
CO2: 18 mmol/L — ABNORMAL LOW (ref 22–32)
CO2: 14 mmol/L — ABNORMAL LOW (ref 22–32)
Calcium: 7.6 mg/dL — ABNORMAL LOW (ref 8.9–10.3)
Calcium: 8.3 mg/dL — ABNORMAL LOW (ref 8.9–10.3)
Chloride: 115 mmol/L — ABNORMAL HIGH (ref 98–111)
Chloride: 116 mmol/L — ABNORMAL HIGH (ref 98–111)
Creatinine, Ser: 1.93 mg/dL — ABNORMAL HIGH (ref 0.61–1.24)
Creatinine, Ser: 2.1 mg/dL — ABNORMAL HIGH (ref 0.61–1.24)
GFR calc Af Amer: 38 mL/min — ABNORMAL LOW (ref >60)
GFR calc Af Amer: 34 mL/min — ABNORMAL LOW (ref >60)
GFR calc non Af Amer: 33 mL/min — ABNORMAL LOW (ref >60)
GFR calc non Af Amer: 30 mL/min — ABNORMAL LOW (ref >60)
Glucose, Bld: 155 mg/dL — ABNORMAL HIGH (ref 70–99)
Glucose, Bld: 200 mg/dL — ABNORMAL HIGH (ref 70–99)
Potassium: 4.9 mmol/L (ref 3.5–5.1)
Potassium: 5.3 mmol/L — ABNORMAL HIGH (ref 3.5–5.1)
Sodium: 140 mmol/L (ref 135–145)
Sodium: 142 mmol/L (ref 135–145)

## 2019-07-15 LAB — GLUCOSE, CAPILLARY
Glucose-Capillary: 137 mg/dL — ABNORMAL HIGH (ref 70–99)
Glucose-Capillary: 141 mg/dL — ABNORMAL HIGH (ref 70–99)
Glucose-Capillary: 159 mg/dL — ABNORMAL HIGH (ref 70–99)
Glucose-Capillary: 181 mg/dL — ABNORMAL HIGH (ref 70–99)
Glucose-Capillary: 218 mg/dL — ABNORMAL HIGH (ref 70–99)

## 2019-07-15 LAB — SURGICAL PCR SCREEN
MRSA, PCR: NEGATIVE
Staphylococcus aureus: NEGATIVE

## 2019-07-15 LAB — CBC
HCT: 29 % — ABNORMAL LOW (ref 39.0–52.0)
HCT: 31.6 % — ABNORMAL LOW (ref 39.0–52.0)
Hemoglobin: 9.1 g/dL — ABNORMAL LOW (ref 13.0–17.0)
Hemoglobin: 9.9 g/dL — ABNORMAL LOW (ref 13.0–17.0)
MCH: 31.8 pg (ref 26.0–34.0)
MCH: 31.9 pg (ref 26.0–34.0)
MCHC: 31.4 g/dL (ref 30.0–36.0)
MCHC: 31.3 g/dL (ref 30.0–36.0)
MCV: 101.4 fL — ABNORMAL HIGH (ref 80.0–100.0)
MCV: 101.9 fL — ABNORMAL HIGH (ref 80.0–100.0)
Platelets: 113 10*3/uL — ABNORMAL LOW (ref 150–400)
Platelets: 120 10*3/uL — ABNORMAL LOW (ref 150–400)
RBC: 2.86 MIL/uL — ABNORMAL LOW (ref 4.22–5.81)
RBC: 3.1 MIL/uL — ABNORMAL LOW (ref 4.22–5.81)
RDW: 15.3 % (ref 11.5–15.5)
RDW: 15.5 % (ref 11.5–15.5)
WBC: 12.6 10*3/uL — ABNORMAL HIGH (ref 4.0–10.5)
WBC: 20.7 10*3/uL — ABNORMAL HIGH (ref 4.0–10.5)
nRBC: 0.2 % (ref 0.0–0.2)
nRBC: 0.3 % — ABNORMAL HIGH (ref 0.0–0.2)

## 2019-07-15 SURGERY — POSTERIOR LUMBAR FUSION 4 LEVEL
Anesthesia: General | Site: Spine Lumbar

## 2019-07-15 MED ORDER — SODIUM CHLORIDE (PF) 0.9 % IJ SOLN
INTRAMUSCULAR | Status: AC
  Filled 2019-07-15: qty 20

## 2019-07-15 MED ORDER — IPRATROPIUM BROMIDE 0.02 % IN SOLN
0.5000 mg | Freq: Four times a day (QID) | RESPIRATORY_TRACT | Status: DC
  Administered 2019-07-15 – 2019-07-25 (×38): 0.5 mg via RESPIRATORY_TRACT
  Filled 2019-07-15 (×41): qty 2.5

## 2019-07-15 MED ORDER — ARFORMOTEROL TARTRATE 15 MCG/2ML IN NEBU
15.0000 ug | INHALATION_SOLUTION | Freq: Two times a day (BID) | RESPIRATORY_TRACT | Status: DC
  Administered 2019-07-15 – 2019-07-25 (×21): 15 ug via RESPIRATORY_TRACT
  Filled 2019-07-15 (×21): qty 2

## 2019-07-15 MED ORDER — THROMBIN 5000 UNITS EX SOLR
OROMUCOSAL | Status: DC | PRN
  Administered 2019-07-15 (×2): 5 mL

## 2019-07-15 MED ORDER — EPINEPHRINE 1 MG/10ML IJ SOSY
PREFILLED_SYRINGE | INTRAMUSCULAR | Status: AC
  Filled 2019-07-15: qty 10

## 2019-07-15 MED ORDER — ONDANSETRON HCL 4 MG/2ML IJ SOLN
INTRAMUSCULAR | Status: AC
  Filled 2019-07-15: qty 2

## 2019-07-15 MED ORDER — EPHEDRINE SULFATE 50 MG/ML IJ SOLN
INTRAMUSCULAR | Status: DC | PRN
  Administered 2019-07-15 (×2): 5 mg via INTRAVENOUS

## 2019-07-15 MED ORDER — FENTANYL CITRATE (PF) 100 MCG/2ML IJ SOLN: 25.0000 ug | Freq: Once | INTRAMUSCULAR | Status: DC

## 2019-07-15 MED ORDER — ALBUMIN HUMAN 5 % IV SOLN
INTRAVENOUS | Status: DC | PRN
  Administered 2019-07-15 (×2): via INTRAVENOUS

## 2019-07-15 MED ORDER — CHLORHEXIDINE GLUCONATE CLOTH 2 % EX PADS: 6.0000 | MEDICATED_PAD | Freq: Once | CUTANEOUS | Status: AC

## 2019-07-15 MED ORDER — EPINEPHRINE PF 1 MG/ML IJ SOLN
INTRAMUSCULAR | Status: DC | PRN
  Administered 2019-07-15: .1 mg via INTRAVENOUS

## 2019-07-15 MED ORDER — PHENYLEPHRINE HCL (PRESSORS) 10 MG/ML IV SOLN
INTRAVENOUS | Status: DC | PRN
  Administered 2019-07-15 (×2): 80 ug via INTRAVENOUS
  Administered 2019-07-15 (×3): 120 ug via INTRAVENOUS

## 2019-07-15 MED ORDER — VASOPRESSIN 20 UNIT/ML IV SOLN
INTRAVENOUS | Status: AC
  Filled 2019-07-15: qty 1

## 2019-07-15 MED ORDER — LIDOCAINE 2% (20 MG/ML) 5 ML SYRINGE
INTRAMUSCULAR | Status: AC
  Filled 2019-07-15: qty 5

## 2019-07-15 MED ORDER — BUDESONIDE 0.25 MG/2ML IN SUSP
0.2500 mg | Freq: Two times a day (BID) | RESPIRATORY_TRACT | Status: DC
  Administered 2019-07-15 – 2019-07-25 (×21): 0.25 mg via RESPIRATORY_TRACT
  Filled 2019-07-15 (×21): qty 2

## 2019-07-15 MED ORDER — SODIUM BICARBONATE 4.2 % IV SOLN
INTRAVENOUS | Status: DC | PRN
  Administered 2019-07-15: 50 meq via INTRAVENOUS

## 2019-07-15 MED ORDER — PHENYLEPHRINE HCL (PRESSORS) 10 MG/ML IV SOLN
INTRAVENOUS | Status: AC
  Filled 2019-07-15: qty 1

## 2019-07-15 MED ORDER — SODIUM BICARBONATE 8.4 % IV SOLN
100.0000 meq | Freq: Once | INTRAVENOUS | Status: AC
  Administered 2019-07-15: 100 meq via INTRAVENOUS
  Filled 2019-07-15: qty 50

## 2019-07-15 MED ORDER — CALCIUM CHLORIDE 10 % IV SOLN
INTRAVENOUS | Status: DC | PRN
  Administered 2019-07-15 (×2): 500 mg via INTRAVENOUS

## 2019-07-15 MED ORDER — INSULIN ASPART 100 UNIT/ML ~~LOC~~ SOLN
0.0000 [IU] | SUBCUTANEOUS | Status: DC
  Administered 2019-07-16: 1 [IU] via SUBCUTANEOUS
  Administered 2019-07-16: 2 [IU] via SUBCUTANEOUS
  Administered 2019-07-16: 04:00:00 3 [IU] via SUBCUTANEOUS
  Administered 2019-07-16: 2 [IU] via SUBCUTANEOUS
  Administered 2019-07-16: 1 [IU] via SUBCUTANEOUS
  Administered 2019-07-16: 2 [IU] via SUBCUTANEOUS
  Administered 2019-07-17: 3 [IU] via SUBCUTANEOUS
  Administered 2019-07-17: 1 [IU] via SUBCUTANEOUS
  Administered 2019-07-17: 2 [IU] via SUBCUTANEOUS
  Administered 2019-07-17 (×2): 1 [IU] via SUBCUTANEOUS
  Administered 2019-07-17: 3 [IU] via SUBCUTANEOUS
  Administered 2019-07-17: 2 [IU] via SUBCUTANEOUS

## 2019-07-15 MED ORDER — PHENYLEPHRINE 40 MCG/ML (10ML) SYRINGE FOR IV PUSH (FOR BLOOD PRESSURE SUPPORT)
PREFILLED_SYRINGE | INTRAVENOUS | Status: AC
  Filled 2019-07-15: qty 30

## 2019-07-15 MED ORDER — FENTANYL CITRATE (PF) 250 MCG/5ML IJ SOLN
INTRAMUSCULAR | Status: AC
  Filled 2019-07-15: qty 5

## 2019-07-15 MED ORDER — PROPOFOL 1000 MG/100ML IV EMUL: 0.0000 ug/kg/min | INTRAVENOUS | Status: DC

## 2019-07-15 MED ORDER — LACTATED RINGERS IV SOLN
INTRAVENOUS | Status: DC | PRN
  Administered 2019-07-15: 13:00:00 via INTRAVENOUS

## 2019-07-15 MED ORDER — THROMBIN 5000 UNITS EX SOLR
CUTANEOUS | Status: AC
  Filled 2019-07-15: qty 5000

## 2019-07-15 MED ORDER — CALCIUM CHLORIDE 10 % IV SOLN
INTRAVENOUS | Status: AC
  Filled 2019-07-15: qty 10

## 2019-07-15 MED ORDER — PROPOFOL 10 MG/ML IV BOLUS
INTRAVENOUS | Status: DC | PRN
  Administered 2019-07-15: 30 mg via INTRAVENOUS
  Administered 2019-07-15: 80 mg via INTRAVENOUS

## 2019-07-15 MED ORDER — PROPOFOL 10 MG/ML IV BOLUS
INTRAVENOUS | Status: AC
  Filled 2019-07-15: qty 20

## 2019-07-15 MED ORDER — DEXAMETHASONE SODIUM PHOSPHATE 10 MG/ML IJ SOLN
INTRAMUSCULAR | Status: DC | PRN
  Administered 2019-07-15: 10 mg via INTRAVENOUS

## 2019-07-15 MED ORDER — SUCCINYLCHOLINE CHLORIDE 200 MG/10ML IV SOSY
PREFILLED_SYRINGE | INTRAVENOUS | Status: AC
  Filled 2019-07-15: qty 10

## 2019-07-15 MED ORDER — MUPIROCIN 2 % EX OINT: 1.0000 "application " | TOPICAL_OINTMENT | Freq: Two times a day (BID) | CUTANEOUS | Status: DC

## 2019-07-15 MED ORDER — MIDAZOLAM HCL 2 MG/2ML IJ SOLN
INTRAMUSCULAR | Status: AC
  Filled 2019-07-15: qty 2

## 2019-07-15 MED ORDER — SODIUM CHLORIDE 0.9 % IV SOLN
INTRAVENOUS | Status: DC | PRN
  Administered 2019-07-15: 500 mL

## 2019-07-15 MED ORDER — DEXAMETHASONE SODIUM PHOSPHATE 10 MG/ML IJ SOLN
INTRAMUSCULAR | Status: AC
  Filled 2019-07-15: qty 1

## 2019-07-15 MED ORDER — BACITRACIN ZINC 500 UNIT/GM EX OINT
TOPICAL_OINTMENT | CUTANEOUS | Status: DC | PRN
  Administered 2019-07-15: 1 via TOPICAL

## 2019-07-15 MED ORDER — LIDOCAINE 2% (20 MG/ML) 5 ML SYRINGE
INTRAMUSCULAR | Status: DC | PRN
  Administered 2019-07-15: 40 mg via INTRAVENOUS

## 2019-07-15 MED ORDER — VASOPRESSIN 20 UNIT/ML IV SOLN
INTRAVENOUS | Status: DC | PRN
  Administered 2019-07-15: 2 [IU] via INTRAVENOUS
  Administered 2019-07-15 (×3): 1 [IU] via INTRAVENOUS

## 2019-07-15 MED ORDER — ESMOLOL HCL 100 MG/10ML IV SOLN
INTRAVENOUS | Status: AC
  Filled 2019-07-15: qty 10

## 2019-07-15 MED ORDER — POLYVINYL ALCOHOL 1.4 % OP SOLN
2.0000 [drp] | OPHTHALMIC | Status: DC | PRN
  Administered 2019-07-16 – 2019-07-22 (×6): 2 [drp] via OPHTHALMIC
  Filled 2019-07-15: qty 15

## 2019-07-15 MED ORDER — BUPIVACAINE HCL (PF) 0.5 % IJ SOLN
INTRAMUSCULAR | Status: AC
  Filled 2019-07-15: qty 30

## 2019-07-15 MED ORDER — LIDOCAINE-EPINEPHRINE 1 %-1:100000 IJ SOLN
INTRAMUSCULAR | Status: DC | PRN
  Administered 2019-07-15: 14 mL

## 2019-07-15 MED ORDER — BUPIVACAINE HCL (PF) 0.5 % IJ SOLN
INTRAMUSCULAR | Status: DC | PRN
  Administered 2019-07-15: 14 mL

## 2019-07-15 MED ORDER — LIDOCAINE-EPINEPHRINE 1 %-1:100000 IJ SOLN
INTRAMUSCULAR | Status: AC
  Filled 2019-07-15: qty 1

## 2019-07-15 MED ORDER — PHENYLEPHRINE HCL-NACL 10-0.9 MG/250ML-% IV SOLN
INTRAVENOUS | Status: DC | PRN
  Administered 2019-07-15: 15 ug/min via INTRAVENOUS

## 2019-07-15 MED ORDER — 0.9 % SODIUM CHLORIDE (POUR BTL) OPTIME
TOPICAL | Status: DC | PRN
  Administered 2019-07-15 (×2): 1000 mL

## 2019-07-15 MED ORDER — MIDAZOLAM HCL 2 MG/2ML IJ SOLN
INTRAMUSCULAR | Status: DC | PRN
  Administered 2019-07-15 (×2): 1 mg via INTRAVENOUS

## 2019-07-15 MED ORDER — FENTANYL CITRATE (PF) 250 MCG/5ML IJ SOLN
INTRAMUSCULAR | Status: DC | PRN
  Administered 2019-07-15 (×2): 50 ug via INTRAVENOUS
  Administered 2019-07-15: 75 ug via INTRAVENOUS
  Administered 2019-07-15: 25 ug via INTRAVENOUS
  Administered 2019-07-15: 50 ug via INTRAVENOUS

## 2019-07-15 MED ORDER — SODIUM BICARBONATE 8.4 % IV SOLN
INTRAVENOUS | Status: AC
  Filled 2019-07-15: qty 50

## 2019-07-15 MED ORDER — PROPOFOL 1000 MG/100ML IV EMUL
INTRAVENOUS | Status: AC
  Filled 2019-07-15: qty 100

## 2019-07-15 MED ORDER — ROCURONIUM BROMIDE 10 MG/ML (PF) SYRINGE
PREFILLED_SYRINGE | INTRAVENOUS | Status: DC | PRN
  Administered 2019-07-15 (×2): 50 mg via INTRAVENOUS

## 2019-07-15 MED ORDER — ROCURONIUM BROMIDE 10 MG/ML (PF) SYRINGE
PREFILLED_SYRINGE | INTRAVENOUS | Status: AC
  Filled 2019-07-15: qty 10

## 2019-07-15 MED ORDER — FENTANYL 2500MCG IN NS 250ML (10MCG/ML) PREMIX INFUSION
25.0000 ug/h | INTRAVENOUS | Status: DC
  Administered 2019-07-16 – 2019-07-19 (×2): 25 ug/h via INTRAVENOUS
  Administered 2019-07-21: 50 ug/h via INTRAVENOUS
  Administered 2019-07-23: 100 ug/h via INTRAVENOUS
  Administered 2019-07-25: 50 ug/h via INTRAVENOUS
  Filled 2019-07-15 (×5): qty 250

## 2019-07-15 MED ORDER — FUROSEMIDE 10 MG/ML IJ SOLN
40.0000 mg | Freq: Once | INTRAMUSCULAR | Status: AC
  Administered 2019-07-15: 40 mg via INTRAVENOUS
  Filled 2019-07-15: qty 4

## 2019-07-15 MED ORDER — FENTANYL BOLUS VIA INFUSION
25.0000 ug | INTRAVENOUS | Status: DC | PRN
  Administered 2019-07-21: 25 ug via INTRAVENOUS
  Filled 2019-07-15: qty 25

## 2019-07-15 MED ORDER — SUCCINYLCHOLINE CHLORIDE 200 MG/10ML IV SOSY
PREFILLED_SYRINGE | INTRAVENOUS | Status: DC | PRN
  Administered 2019-07-15: 120 mg via INTRAVENOUS

## 2019-07-15 SURGICAL SUPPLY — 92 items
BAG DECANTER FOR FLEXI CONT (MISCELLANEOUS) ×4 IMPLANT
BASKET BONE COLLECTION (BASKET) ×4 IMPLANT
BENZOIN TINCTURE PRP APPL 2/3 (GAUZE/BANDAGES/DRESSINGS) IMPLANT
BIT DRILL LONG 3.0X30 (BIT) IMPLANT
BIT DRILL LONG 3.0X30MM (BIT)
BIT DRILL LONG 3X80 (BIT) IMPLANT
BIT DRILL LONG 3X80MM (BIT)
BIT DRILL LONG 4X80 (BIT) IMPLANT
BIT DRILL LONG 4X80MM (BIT)
BIT DRILL SHORT 3.0X30 (BIT) IMPLANT
BIT DRILL SHORT 3.0X30MM (BIT)
BIT DRILL SHORT 3X80 (BIT) IMPLANT
BIT DRILL SHORT 3X80MM (BIT)
BLADE CLIPPER SURG (BLADE) IMPLANT
BLADE SURG 11 STRL SS (BLADE) ×8 IMPLANT
BUR MATCHSTICK NEURO 3.0 LAGG (BURR) ×4 IMPLANT
BUR PRECISION FLUTE 5.0 (BURR) ×4 IMPLANT
CANISTER SUCT 3000ML PPV (MISCELLANEOUS) ×4 IMPLANT
CARTRIDGE OIL MAESTRO DRILL (MISCELLANEOUS) ×2 IMPLANT
CATH FOLEY 2WAY SLVR  5CC 14FR (CATHETERS) ×2
CATH FOLEY 2WAY SLVR 5CC 14FR (CATHETERS) ×2 IMPLANT
CLOSURE WOUND 1/2 X4 (GAUZE/BANDAGES/DRESSINGS)
CONT SPEC 4OZ CLIKSEAL STRL BL (MISCELLANEOUS) ×4 IMPLANT
COVER BACK TABLE 60X90IN (DRAPES) ×4 IMPLANT
COVER WAND RF STERILE (DRAPES) ×8 IMPLANT
DECANTER SPIKE VIAL GLASS SM (MISCELLANEOUS) ×4 IMPLANT
DERMABOND ADVANCED (GAUZE/BANDAGES/DRESSINGS) ×2
DERMABOND ADVANCED .7 DNX12 (GAUZE/BANDAGES/DRESSINGS) ×2 IMPLANT
DEVICE BONE FILLER 6550102 2PK (MISCELLANEOUS) ×20 IMPLANT
DIFFUSER DRILL AIR PNEUMATIC (MISCELLANEOUS) ×4 IMPLANT
DRAIN JACKSON PRT FLT 7MM (DRAIN) ×4 IMPLANT
DRAPE C-ARM 42X72 X-RAY (DRAPES) ×4 IMPLANT
DRAPE C-ARMOR (DRAPES) ×4 IMPLANT
DRAPE LAPAROTOMY 100X72X124 (DRAPES) ×4 IMPLANT
DRAPE SHEET LG 3/4 BI-LAMINATE (DRAPES) ×4 IMPLANT
DRAPE SURG 17X23 STRL (DRAPES) ×4 IMPLANT
DURAPREP 26ML APPLICATOR (WOUND CARE) ×4 IMPLANT
ELECT REM PT RETURN 9FT ADLT (ELECTROSURGICAL) ×4
ELECTRODE REM PT RTRN 9FT ADLT (ELECTROSURGICAL) ×2 IMPLANT
EXTENDER TAB GUIDE SV 5.5/6.0 (INSTRUMENTS) ×96 IMPLANT
GAUZE 4X4 16PLY RFD (DISPOSABLE) ×4 IMPLANT
GAUZE SPONGE 4X4 12PLY STRL (GAUZE/BANDAGES/DRESSINGS) IMPLANT
GLOVE BIO SURGEON STRL SZ7 (GLOVE) IMPLANT
GLOVE BIO SURGEON STRL SZ7.5 (GLOVE) IMPLANT
GLOVE BIOGEL PI IND STRL 7.0 (GLOVE) IMPLANT
GLOVE BIOGEL PI IND STRL 7.5 (GLOVE) ×4 IMPLANT
GLOVE BIOGEL PI INDICATOR 7.0 (GLOVE)
GLOVE BIOGEL PI INDICATOR 7.5 (GLOVE) ×4
GLOVE ECLIPSE 7.0 STRL STRAW (GLOVE) ×8 IMPLANT
GLOVE EXAM NITRILE XL STR (GLOVE) IMPLANT
GOWN STRL REUS W/ TWL LRG LVL3 (GOWN DISPOSABLE) ×4 IMPLANT
GOWN STRL REUS W/ TWL XL LVL3 (GOWN DISPOSABLE) IMPLANT
GOWN STRL REUS W/TWL 2XL LVL3 (GOWN DISPOSABLE) IMPLANT
GOWN STRL REUS W/TWL LRG LVL3 (GOWN DISPOSABLE) ×4
GOWN STRL REUS W/TWL XL LVL3 (GOWN DISPOSABLE)
GUIDEWIRE BLUNT NT 450 (WIRE) ×48 IMPLANT
HEMOSTAT POWDER KIT SURGIFOAM (HEMOSTASIS) ×8 IMPLANT
KIT BASIN OR (CUSTOM PROCEDURE TRAY) ×4 IMPLANT
KIT POSITION SURG JACKSON T1 (MISCELLANEOUS) ×4 IMPLANT
KIT SPINE MAZOR X ROBO DISP (MISCELLANEOUS) ×4 IMPLANT
KIT TURNOVER KIT B (KITS) ×4 IMPLANT
MILL MEDIUM DISP (BLADE) ×4 IMPLANT
NEEDLE HYPO 18GX1.5 BLUNT FILL (NEEDLE) IMPLANT
NEEDLE HYPO 22GX1.5 SAFETY (NEEDLE) ×4 IMPLANT
NEEDLE SPNL 18GX3.5 QUINCKE PK (NEEDLE) IMPLANT
NEEDLE SPNL 22GX3.5 QUINCKE BK (NEEDLE) ×4 IMPLANT
NS IRRIG 1000ML POUR BTL (IV SOLUTION) ×4 IMPLANT
OIL CARTRIDGE MAESTRO DRILL (MISCELLANEOUS) ×4
PACK LAMINECTOMY NEURO (CUSTOM PROCEDURE TRAY) ×4 IMPLANT
PAD ARMBOARD 7.5X6 YLW CONV (MISCELLANEOUS) ×12 IMPLANT
PIN HEAD 2.5X60MM (PIN) IMPLANT
PUTTY DBX 1CC (Putty) ×4 IMPLANT
PUTTY DBX 1CC DEPUY (Putty) ×2 IMPLANT
ROD PERC STRT 5.5X190 (Rod) ×8 IMPLANT
SCREW FENS MAS 6.5X50 (Screw) ×24 IMPLANT
SCREW FENS MAS CCM 7.5X45 (Screw) ×24 IMPLANT
SCREW SCHANZ SA 4.0MM (MISCELLANEOUS) IMPLANT
SCREW SET 5.5/6.0MM SOLERA (Screw) ×48 IMPLANT
SLEEVE SURGEON STRL (DRAPES) ×4 IMPLANT
SPONGE LAP 4X18 RFD (DISPOSABLE) IMPLANT
SPONGE SURGIFOAM ABS GEL 100 (HEMOSTASIS) IMPLANT
STRIP CLOSURE SKIN 1/2X4 (GAUZE/BANDAGES/DRESSINGS) IMPLANT
SUT VIC AB 0 CT1 18XCR BRD8 (SUTURE) ×4 IMPLANT
SUT VIC AB 0 CT1 8-18 (SUTURE) ×4
SUT VICRYL 3-0 RB1 18 ABS (SUTURE) ×4 IMPLANT
SYR 3ML LL SCALE MARK (SYRINGE) ×12 IMPLANT
TIP FENESTRATED 6550202 2PK (MISCELLANEOUS) ×16 IMPLANT
TOWEL GREEN STERILE (TOWEL DISPOSABLE) ×4 IMPLANT
TOWEL GREEN STERILE FF (TOWEL DISPOSABLE) ×4 IMPLANT
TRAY FOLEY MTR SLVR 16FR STAT (SET/KITS/TRAYS/PACK) ×8 IMPLANT
TUBE MAZOR SA REDUCTION (TUBING) ×4 IMPLANT
WATER STERILE IRR 1000ML POUR (IV SOLUTION) ×4 IMPLANT

## 2019-07-15 NOTE — Transfer of Care (Signed)
Immediate Anesthesia Transfer of Care Note  Patient: Lucas Price  Procedure(s) Performed: THORACIC TEN - LUMBAR THREE  FUSION (N/A Spine Lumbar) APPLICATION OF ROBOTIC ASSISTANCE FOR SPINAL PROCEDURE (N/A )  Patient Location: NICU  Anesthesia Type:General  Level of Consciousness: Patient remains intubated per anesthesia plan  Airway & Oxygen Therapy: Patient remains intubated per anesthesia plan  Post-op Assessment: Report given to RN and Post -op Vital signs reviewed and stable  Post vital signs: Reviewed and stable  Last Vitals:  Vitals Value Taken Time  BP 96/57 07/31/2019 1730  Temp    Pulse 108 07/24/2019 1729  Resp 18 08/04/2019 1733  SpO2 83 % 07/19/2019 1729  Vitals shown include unvalidated device data.  Last Pain:  Vitals:   07/16/2019 1210  TempSrc:   PainSc: 5       Patients Stated Pain Goal: 3 (42/70/62 3762)  Complications: No apparent anesthesia complications

## 2019-07-15 NOTE — Progress Notes (Signed)
Post intubation ABG reported to MD. RR changed from 18 to 22. No other changes at this time.

## 2019-07-15 NOTE — Brief Op Note (Signed)
07/22/2019  4:55 PM  PATIENT:  Lucas Price  78 y.o. male  PRE-OPERATIVE DIAGNOSIS:  UNSTABLE LUMBAR FRACTURE  POST-OPERATIVE DIAGNOSIS:  UNSTABLE LUMBAR FRACTURE  PROCEDURE:  Procedure(s) with comments: THORACIC TEN - LUMBAR THREE  FUSION (N/A) - THORACIC TEN - LUMBAR THREE  FUSION APPLICATION OF ROBOTIC ASSISTANCE FOR SPINAL PROCEDURE (N/A) - APPLICATION OF ROBOTIC ASSISTANCE FOR SPINAL PROCEDURE  SURGEON:  Surgeon(s) and Role:    Consuella Lose, MD - Primary  ASSISTANTS: Ferne Reus, PA-C   ANESTHESIA:   general  EBL:  500 mL   BLOOD ADMINISTERED:2 CC PRBC  DRAINS: (55mm) Jackson-Pratt drain(s) with closed bulb suction in the lower back   LOCAL MEDICATIONS USED:  MARCAINE    and LIDOCAINE   SPECIMEN:  No Specimen  DISPOSITION OF SPECIMEN:  N/A  COUNTS:  YES  TOURNIQUET:  * No tourniquets in log *  DICTATION: .Note written in EPIC  PLAN OF CARE: has active order  PATIENT DISPOSITION:  ICU - intubated and hemodynamically stable.   Delay start of Pharmacological VTE agent (>24hrs) due to surgical blood loss or risk of bleeding: yes

## 2019-07-15 NOTE — Consult Note (Signed)
NAME:  Lucas Price, MRN:  [id number], DOB:  07-02-1941, LOS: 2 ADMISSION DATE:  07/19/2019, CONSULTATION DATE:  12/3 REFERRING MD:  Dr. Christella Noa, CHIEF COMPLAINT:  Hypotension   Brief History   78 year old male admitted 12/1 after fall and found to have central cord syndrome with stenosis C5-T1. Taken urgently for decompression 12/2. In the early AM hours he became hypotensive and PCCM was consulted.   History of present illness   78 year old male with PMH as below, which is significant for COPD on as needed home O2, sleep apnea on nocturnal BiPAP, CAD, DM, hypertension, and CKD.  He reports poor activity tolerance at baseline.  The patient is a retired Film/video editor.  He was in his usual state of health until the late p.m. hours of 12/1 when he fell while climbing stairs and claims he struck his head.  Presented to the emergency department with complaints of bilateral upper extremity pain.  He was also having paresthesias in bilateral upper extremities with weakness more pronounced in the bilateral lower extremities.  He had some borderline hypotension which improved with 1 L of crystalloid.  CT scan the emergency department was concerning for ligamentous injury at C3. An MRI of cervical and lumbar spine were subsequently ordered. C spine MRI significant for cervical ligament injury, cervical stenosis, cord contusion. L spine MRI significant for T12-L1 disc space fracture.  He was diagnosed with central cord syndrome at the level of C5-C6 and C6-C7 and was taken urgently to the operating room for repair by Dr. Kathyrn Sheriff 12/2. The procedure was uncomplicated. Then in the early AM hours of 12/3 he was given hydromorphone for pain and was taken for follow up CT. Shortly after he developed hypotension. PCCM consulted.   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  Significant Hospital Events   12/1 fall while climbing stairs, present to ED 12/2 found to have central cord syndrome, taken to OR for decompression.  12/3 Hypotensive, PCCM consult.   Consults:  Cardiology NSG PCCM  Procedures:  12/3: ACDF C5-T1  Significant Diagnostic Tests:  CT head Cspine 12/1 > Mild posterior vertex scalp soft tissue injury without underlying fracture. No acute traumatic injury to the brain identified. Suggestion of disproportionate biparietal cerebral volume loss, non-specific but can be seen with dementia. CT chest 12/2 > new GGO in [place] and throughout R lung. Unchanged nodules from 2019. New age-indeterminate widening of the anterior interdisc space at T12-L1 which is partially visualized and concerning for fracture of the anterior flowing osteophyte. MRI cervical spine 12/2 > Acute disruption of the anterior longitudinal ligament at C5-C6 with a acute fracture of the anterior inferior endplate of the C5 osteophyte . Significant prevertebral soft tissue edema and fluid is seen extending from C2 through C6. Marrow edema/osseous contusions involving the C5-C7 anterior vertebral bodies. Interspinous ligamentous injury at C5-C6. Findings suggestive of acute cord contusion seen at C5 with cord edema. No definite epidural hematoma or hemorrhage. 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 MRI lumbar spine 12/2 > Acute fracture through ankylosed spine at the T12-L1 disc space. Disrupted anterior longitudinal ligament, disc, and propagation of the fracture through the posterior elements including the L1 spinous process. This is most likely an unstable injury. Associated paraspinal muscle injury, including a small intramuscular hematoma suspected along the lateral right erector spinae fibers at L2-L3. Transitional lumbosacral anatomy, with sacralized L5 level and vestigial L5-S1 disc space. Superimposed lumbar spine degeneration: advanced lumbar disc and endplate disease with multilevel severe lateral recess stenosis, moderate L3-L4 spinal stenosis.  Micro Data:    Antimicrobials:  CTX 12/1 > Azithromycin 12/1 >  Interim history/subjective:  RN reports no acute events overnight, states his is more short of breath today and feels more weak.   Objective   Blood pressure (!) 108/46, pulse 89, temperature (!) 97.4 F (36.3 C), temperature source Oral, resp. rate 18, height 5\' 3"  (1.6 m), weight 78.5 kg, SpO2 95 %.    FiO2 (%):  [6 %] 6 %   Intake/Output Summary (Last 24 hours) at 07/19/2019 0839 Last data filed at 08/06/2019 0800 Gross per 24 hour  Intake 3401.48 ml  Output 905 ml  Net 2496.48 ml   Filed Weights   08/10/2019 1901 07/12/2019 1355 07/19/2019 1937  Weight: 80.7 kg 80.7 kg 78.5 kg    Examination: General: Chronically ill appearing elderly male lying in bed, in NAD HEENT: [place]/AT, C-collar in place, surgical incision with old drainage seen, MM pink/moist, PERRL,  Neuro: Alert and oriented x3, 3/5 strength in all extremities,  CV: s1s2 regular rate and rhythm, no murmur, rubs, or gallops,  PULM:  Clear bilaterally with weak cough and inspiratory efforts GI: soft, bowel sounds active in all 4 quadrants, non-tender, non-distended  Extremities: warm/dry, no edema  Skin: no rashes or lesions   Resolved Hospital Problem list   Hyperkalemia; 5.4 this morning Hyperphos  Assessment & Plan:   Neurogenic shock  Hypotension   -In the setting central cord compression, dilaudid administration, and poor PO intake over the last 24-48 hours P: Continue IV hydration Lactic acid WNL Pressor support to maintain MAP greater than 65 Minimize sedation  Hold home antihypertensives   Central cord syndrome at the level of the cervical spine secondary to fall.  T12-L1 fracture P: Management per neurosurgery  S/p ACDF cervical 12/2 OR for stabilization of thoracolumbar fracture 12/4  CAP: -Given very poor ineffective cough and respiratory effort he remains at high risk of intubation  P:  Continue IV antibiotics  [place]rens CBC can fever curve Follow cultures Flutter valve and cough assit Elevated HOB as able per neurosurgery   COPD without acute exacerbation P: Wean supplemental oxygen to maintain SpO2 of 88-92% Short acting bronchodilators Obtain and follow cultures  BIPAP qhs Transition from inhaler to neb due to poor inspiratory effort    Urinary retention P: Continue Foley   AKI on CKD -Baseline creatinine 1.8 P: Follow renal function / urine output Trend Bmet Avoid nephrotoxins, ensure adequate renal perfusion  IV hydration Obtain urine lytes   IDDM P: SSI  Continue long acting insulin   CAD history P: Cardiology following   Best practice:  Diet: NPO Pain/Anxiety/Delirium protocol (if indicated): NA VAP protocol (if indicated): NA DVT prophylaxis: per primary GI prophylaxis: per primary Glucose control: SSI Mobility: BR Code Status: FULL Family Communication:  Patient updated Disposition: ICU  Labs   CBC: Recent Labs  Lab 07/24/2019 2125 08/01/2019 1323 07/14/19 0427 07/27/2019 0416  WBC 20.3* 12.1* 13.7* 12.6*  NEUTROABS 17.9*  --   --   --   HGB 12.9* 10.8* 10.0* 9.1*  HCT 40.4 34.1*  31.3* 29.0*  MCV 98.3 99.1 99.4 101.4*  PLT 152 151 112* 113*    Basic Metabolic Panel: Recent Labs  Lab 07/20/2019 2125 07/30/2019 1323 07/14/19 0427 07/14/19 0835 07/14/19 1244 07/31/2019 0416  NA 135 134* 139 137  --  140  K 4.6 5.1 5.4* 6.0* 6.1* 4.9  CL 105 106 107 110  --  115*  CO2 18* 19* 22 18*  --  18*  GLUCOSE 161* 150* 189* 149*  --  155*  BUN 51* 55* 58* 61*  --  57*  CREATININE 1.82* 1.89* 2.00* 2.00*  --  1.93*  CALCIUM 8.8* 8.2* 8.1* 7.5*  --  7.6*  MG  --   --  2.3  --   --   --   PHOS  --   --  5.9*  --   --   --    GFR: Estimated Creatinine Clearance: 30.2 mL/min (A) (by C-G formula based on SCr of 1.93 mg/dL (H)). Recent Labs  Lab 07/20/2019 2125 07/12/2019 1323 07/14/19 0427 07/14/19 0835 08/11/2019 0416  PROCALCITON  --  0.45  --   --   --   WBC 20.3* 12.1* 13.7*  --  12.6*  LATICACIDVEN  --   --   --  1.6  --     Liver Function Tests: Recent Labs  Lab 08/02/2019 2125 07/14/19 0427  AST 47* 39  ALT 40 30  ALKPHOS 73 52  BILITOT 0.9 0.5  PROT 7.1 5.8*  ALBUMIN 3.1* 2.8*   No results for input(s): LIPASE, AMYLASE in the last 168 hours. No results for input(s): AMMONIA in the last 168 hours.  ABG    Component Value Date/Time   PHART 7.407 07/22/2017 1455   PCO2ART 42.6 07/22/2017 1455   PO2ART 55.1 (L) 07/22/2017 1455   HCO3 26.3 07/22/2017 1455   O2SAT 87.9 07/22/2017 1455     Coagulation Profile: Recent Labs  Lab 07/14/19 0427  INR 1.2    Cardiac Enzymes: No results for input(s): CKTOTAL, CKMB, CKMBINDEX, TROPONINI in the last 168 hours.  HbA1C: Hemoglobin A1C  Date/Time Value Ref Range Status  05/30/2019 02:01 PM 5.1 4.0 - 5.6 % Final   Hgb A1c MFr Bld  Date/Time Value Ref Range Status  01/11/2019 03:27 PM 6.8 (H) 4.6 - 6.5 % Final    Comment:    Glycemic Control Guidelines for People with Diabetes:Non Diabetic:  <6%Goal of Therapy: <7%Additional Action Suggested:  >8%   09/14/2018 03:00 PM 5.7 4.6 - 6.5 % Final    Comment:     Glycemic Control Guidelines for People with Diabetes:Non Diabetic:  <6%Goal of Therapy: <7%Additional Action Suggested:  >8%     CBG: Recent Labs  Lab 07/16/2019 2123 07/14/19 0718 07/14/19 1132 07/14/19 2243 08/04/2019 0820  GLUCAP 153* 140* 132* 132* 137*    Critical care time: 37 mins    Performed by: Johnsie Cancel   Total critical care time: 37 minutes  Critical care time was exclusive of separately billable procedures and treating other patients.  Critical care was necessary to treat or prevent imminent or life-threatening deterioration.  Critical care was time spent personally by me on the following activities: development of  treatment plan with patient and/or surrogate as well as nursing, discussions with consultants, evaluation of patient's response to treatment, examination of patient, obtaining history from patient or surrogate, ordering and performing treatments and interventions, ordering and review of laboratory studies, ordering and review of radiographic studies, pulse oximetry and re-evaluation of patient's condition.   Johnsie Cancel, NP-C [facility] After hours pager: [phone]. 08/06/2019, 9:03 AM

## 2019-07-15 NOTE — Progress Notes (Signed)
SLP Cancellation Note  Patient Details Name: MIHCAEL LEDEE MRN: [id number] DOB: 1940-11-13   Cancelled treatment:        Pt NPO for surgery today. Will continue efforts.                                                                                                 [place] Siren 07/18/2019, 2:54 PM  Orbie Pyo Colvin Caroli.Ed Risk analyst [phone] Office [phone]

## 2019-07-15 NOTE — Progress Notes (Signed)
  NEUROSURGERY PROGRESS NOTE   No issues overnight.   EXAM:  BP (!) 109/50   Pulse 88   Temp 98 F (36.7 C) (Axillary)   Resp 16   Ht 5\' 3"  (1.6 m)   Wt 78.5 kg   SpO2 100%   BMI 30.66 kg/m   Awake, alert, oriented  Speech fluent, appropriate  CN grossly intact  Stable quadriparesis: UE: Left: 4/5 bicep/tricep, 3/5 grip  Right: 4/5 bicep 3/5 tricep, 2/5 grip LE: Left: 3/5 IP 3/5 PF/DF  Right: 2/5 IP 2/5 PF/DF  Wound c/d/i, neck soft   IMPRESSION:  - 78 y.o. male s/p fall with central cord injury, POD#2 s/p C5-T1 ACDF for stenosis. Remains relatively stable neurologically.  - T12-L1 Chance fracture - Poor respiratory effort likely multifactorial including baseline severe restrictive lung disease and superimposed spinal cord injury  PLAN: - OR today for stabilization of thoracolumbar chance fracture  I have reviewed the surgery with the patient. Risks of the procedure were reviewed including risk of spinal cord injury, bleeding, infection, CSF leak. General risks of anesthesia were again reviewed. All questions were answered and he provided consent to proceed.

## 2019-07-15 NOTE — Op Note (Addendum)
NEUROSURGERY OPERATIVE NOTE   PREOP DIAGNOSIS:  1. Cervical Stenosis 2. Central Cord Syndrome  POSTOP DIAGNOSIS: Same  PROCEDURE: 1. Discectomy at C5-6, C6-7, C7-T1 for decompression of spinal cord and exiting nerve roots  2. Placement of intervertebral biomechanical device,   Medtronic Titan 59mm lodotic small footprint @ C5-6  46mm lordotic small @ C6-7  17mm lordotic small @ C7-T1 3. Placement of anterior instrumentation consisting of interbody plate and screws spanning C5-T1 4. Use of morselized bone allograft  5. Arthrodesis C5-6, C6-7, C7-T1, anterior interbody technique  6. Use of intraoperative microscope  SURGEON: Dr. Consuella Lose, MD  ASSISTANT: Dr. Ashok Pall, MD  ANESTHESIA: General Endotracheal  EBL: 80cc  SPECIMENS: None  DRAINS: None  COMPLICATIONS: None immediate  CONDITION: Hemodynamically stable to PACU  HISTORY: Lucas Price is a 78 y.o. man initially presented to the emergency department after suffering a fall.  Subsequent to this, he noted severe weakness in his legs, and to a lesser extent in the arms.  Work-up in the emergency department included CT scan and MRI of the neck and lumbar spine.  Neck MRI did demonstrate severe underlying spondylitic disease including severe stenosis due to broad-based disc and spinal cord compression at C5-6, C6-7, and C7-T1.  There is associated T2 signal change within the spinal cord at C5-6.  His physical exam was consistent with a central cord type syndrome.  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  Risks and benefits of early surgical decompression were discussed with both the patient who is a retired Engineer, drilling, his son, and his nephew who is also a Engineer, drilling.  After all her questions were answered, informed consent was obtained and witnessed.  PROCEDURE IN DETAIL: The patient was brought to the operating room and transferred to  the operative table. After induction of general anesthesia, the patient was positioned on the operative table in the supine position with all pressure points meticulously padded. The skin of the neck was then prepped and draped in the usual sterile fashion.  After timeout was conducted, the skin was infiltrated with local anesthetic. Skin incision was then made sharply and Bovie electrocautery was used to dissect the subcutaneous tissue until the platysma was identified. The platysma was then divided and undermined. The sternocleidomastoid muscle was then identified and, utilizing natural fascial planes in the neck, the prevertebral fascia was identified and the carotid sheath was retracted laterally and the trachea and esophagus retracted medially. Again using fluoroscopy, spinal needle was introduced into the C6-7 disc space and location was confirmed. Bovie electrocautery was used to dissect in the subperiosteal plane and elevate the bilateral longus coli muscles.  This allowed exposure of the C5-6 disc space above as well as the C7-T1 disc space below.  Self-retaining retractors were then placed. At this point, the microscope was draped and brought into the field, and the remainder of the case was done under the microscope using microdissecting technique.  The C5-6 disc space was incised sharply and rongeurs were use to initially complete a discectomy. The high-speed drill was then used to complete discectomy until the posterior annulus was identified and removed and the posterior longitudinal ligament was identified. Using a nerve hook, the PLL was elevated, and Kerrison rongeurs were used to remove the posterior longitudinal ligament and the ventral thecal sac was identified.  The posterior longitudinal ligament was identified and noted to be severely thickened.  It also appeared to be  partially calcified.  Using a combination of Kerrison punches and curettes, this was removed in order to fully decompress  the thecal sac centrally.  I was unable to freely pass a ball-tipped dissector within the ventral epidural space indicating the decompression.  Of note, the ligament was noted to be densely adherent to the dura, and on the patient's left a portion of the dura was removed.  Arachnoid was identified however there is no actual CSF egress.    At this point, a 6 mm lordotic interbody cage was sized and packed with morcellized bone allograft.  After a small piece of collagen onlay graft was placed, the lordotic interbody cage was inserted and tapped into place.    Attention was then turned to the C6-7 level. In a similar fashion, discectomy was completed initially with curettes and rongeurs, and completed with the drill. The PLL was again identified, elevated and incised. Using Kerrison rongeurs, decompression of the spinal cord and exiting roots was completed and confirmed with a dissector.  The PLL was noted to be significantly thickened centrally, with compression of the thecal sac in this location.  A 5 mm lordotic interbody cage was then sized and filled with bone allograft, and tapped into place. Position of the interbody devices was then confirmed with fluoroscopy.  Attention was then turned to the C7-T1 level. In a similar fashion, discectomy was completed initially with curettes and rongeurs, and completed with the drill. The PLL was again identified, elevated and incised.  Again, very similar to the above levels, there was a relatively central thickening of the PLL with a partially calcified portion compressing the central aspect of the thecal sac.  Once this was removed I was able to freely pass a dissector within the ventral epidural space.  A 6 mm lordotic interbody cage was then sized and filled with bone allograft, and tapped into place. Position of the interbody devices was then confirmed with fluoroscopy.  After placement of the intervertebral devices, the anterior cervical plate was selected,  and placed across the interspaces. Using a high-speed drill, the cortex of the cervical vertebral bodies was punctured, and screws inserted in the C5, C6, C7, and T1 levels. Final fluoroscopic images in lateral projection was taken to confirm good hardware placement.  At this point, after all counts were verified to be correct, meticulous hemostasis was secured using a combination of bipolar electrocautery and passive hemostatics. The platysma muscle was then closed using interrupted 3-0 Vicryl sutures, and the skin was closed with a interrupted subcuticular stitch. Sterile dressings were then applied and the drapes removed.  The patient tolerated the procedure well and was extubated in the room and taken to the postanesthesia care unit in stable condition.  At the end of the case all sponge, needle, instrument, and cottonoid counts were correct.

## 2019-07-15 NOTE — Progress Notes (Signed)
Pt received from the floor on 10L HFNC, O2 sat at 88-90%. HFNC increased to 15L. Pt sts he is having a hard time breathing, and needs suction. Francesca Oman attempted, but pt in unable to cough strong enough to remove the mucus. Dr. Smith Robert from anesthesia made aware.

## 2019-07-15 NOTE — Progress Notes (Signed)
eLink Physician-Brief Progress Note Patient Name: JAELAN RASHEED DOB: 12-29-1940 MRN: [id number]   Date of Service  08/08/2019  HPI/Events of Note  Notified of glucose 218. On Lantus 10 units  eICU Interventions  Changed SSI to q 4 while not on tube feeding     Intervention Category Major Interventions: Hyperglycemia - active titration of insulin therapy  Judd Lien 08/02/2019, 11:33 PM

## 2019-07-15 NOTE — Anesthesia Procedure Notes (Signed)
Arterial Line Insertion Start/End12/06/2019 2:00 PM, 07/15/2019 2:00 PM Performed by: Clearnce Sorrel, CRNA  Patient location: Pre-op. Preanesthetic checklist: patient identified, IV checked, site marked, risks and benefits discussed, surgical consent, monitors and equipment checked, pre-op evaluation, timeout performed and anesthesia consent Lidocaine 1% used for infiltration Right, radial was placed Catheter size: 20 Fr Hand hygiene performed  and maximum sterile barriers used   Attempts: 1 Procedure performed without using ultrasound guided technique. Following insertion, dressing applied. Post procedure assessment: normal and unchanged

## 2019-07-15 NOTE — Progress Notes (Signed)
PT Cancellation Note  Patient Details Name: Lucas Price MRN: [id number] DOB: 1940/10/02   Cancelled Treatment:    Reason Eval/Treat Not Completed: Patient at procedure or test/unavailable;Medical issues which prohibited therapy - Pt to OR today for thoracolumbar stabilization, will check back post-operatively.   [place] Pager [phone]  Office [phone]    [place] 07/20/2019, 9:33 AM

## 2019-07-15 NOTE — Anesthesia Preprocedure Evaluation (Addendum)
Anesthesia Evaluation  Patient identified by MRN, date of birth, ID band Patient awake    Reviewed: Allergy & Precautions, NPO status , Patient's Chart, lab work & pertinent test results  Airway Mallampati: III  TM Distance: >3 FB Neck ROM: Limited    Dental  (+) Teeth Intact, Dental Advisory Given   Pulmonary sleep apnea and Oxygen sleep apnea , COPD, former smoker,    breath sounds clear to auscultation       Cardiovascular hypertension, Pt. on home beta blockers + CAD   Rhythm:Regular Rate:Normal     Neuro/Psych  Neuromuscular disease negative psych ROS   GI/Hepatic Neg liver ROS, GERD  ,  Endo/Other  diabetes, Type 2, Insulin Dependent, Oral Hypoglycemic Agents  Renal/GU Renal disease     Musculoskeletal negative musculoskeletal ROS (+)   Abdominal (+) + obese,   Peds  Hematology   Anesthesia Other Findings   Reproductive/Obstetrics                           Anesthesia Physical Anesthesia Plan  ASA: III  Anesthesia Plan: General   Post-op Pain Management:    Induction: Intravenous  PONV Risk Score and Plan: 3 and Ondansetron and Dexamethasone  Airway Management Planned: Oral ETT and Video Laryngoscope Planned  Additional Equipment: None  Intra-op Plan:   Post-operative Plan: Possible Post-op intubation/ventilation  Informed Consent: I have reviewed the patients History and Physical, chart, labs and discussed the procedure including the risks, benefits and alternatives for the proposed anesthesia with the patient or authorized representative who has indicated his/her understanding and acceptance.     Dental advisory given  Plan Discussed with: CRNA  Anesthesia Plan Comments:        Anesthesia Quick Evaluation

## 2019-07-15 NOTE — Progress Notes (Signed)
Patient arrived to unit being bagged by CRNA. Patient transferred over to the ventilator on his 8cc VT. Patient tolerating well. Tube holder was placed on patient. RN at bedside.

## 2019-07-15 NOTE — Anesthesia Procedure Notes (Signed)
Procedure Name: Intubation Date/Time: 08/08/2019 12:24 PM Performed by: Clearnce Sorrel, CRNA Pre-anesthesia Checklist: Patient identified, Emergency Drugs available, Suction available, Patient being monitored and Timeout performed Patient Re-evaluated:Patient Re-evaluated prior to induction Oxygen Delivery Method: Circle system utilized Preoxygenation: Pre-oxygenation with 100% oxygen Induction Type: IV induction and Rapid sequence Laryngoscope Size: Glidescope and 4 Grade View: Grade I Tube type: Oral Tube size: 7.5 mm Number of attempts: 1 Airway Equipment and Method: Stylet Placement Confirmation: ETT inserted through vocal cords under direct vision,  positive ETCO2 and breath sounds checked- equal and bilateral Secured at: 23 cm Tube secured with: Tape Dental Injury: Teeth and Oropharynx as per pre-operative assessment

## 2019-07-15 NOTE — Telephone Encounter (Signed)
FYI

## 2019-07-15 NOTE — Progress Notes (Signed)
PCCM progress:  Pt rounded on overnight, ABG  Improved 7.27/29.4/57/15.  Will increase O2 to 60% on Vent.  CXR with with likely volume overload, d/c IVF and give Lasix 40mg x1.

## 2019-07-16 ENCOUNTER — Inpatient Hospital Stay (HOSPITAL_COMMUNITY): Payer: Medicare Other

## 2019-07-16 ENCOUNTER — Inpatient Hospital Stay: Payer: Self-pay

## 2019-07-16 DIAGNOSIS — S14129A Central cord syndrome at unspecified level of cervical spinal cord, initial encounter: Secondary | ICD-10-CM | POA: Diagnosis not present

## 2019-07-16 DIAGNOSIS — I9581 Postprocedural hypotension: Secondary | ICD-10-CM | POA: Diagnosis not present

## 2019-07-16 LAB — BASIC METABOLIC PANEL
Anion gap: 10 (ref 5–15)
BUN: 60 mg/dL — ABNORMAL HIGH (ref 8–23)
CO2: 18 mmol/L — ABNORMAL LOW (ref 22–32)
Calcium: 7.7 mg/dL — ABNORMAL LOW (ref 8.9–10.3)
Chloride: 120 mmol/L — ABNORMAL HIGH (ref 98–111)
Creatinine, Ser: 2.08 mg/dL — ABNORMAL HIGH (ref 0.61–1.24)
GFR calc Af Amer: 35 mL/min — ABNORMAL LOW (ref >60)
GFR calc non Af Amer: 30 mL/min — ABNORMAL LOW (ref >60)
Glucose, Bld: 201 mg/dL — ABNORMAL HIGH (ref 70–99)
Potassium: 4.6 mmol/L (ref 3.5–5.1)
Sodium: 148 mmol/L — ABNORMAL HIGH (ref 135–145)

## 2019-07-16 LAB — GLUCOSE, CAPILLARY
Glucose-Capillary: 206 mg/dL — ABNORMAL HIGH (ref 70–99)
Glucose-Capillary: 174 mg/dL — ABNORMAL HIGH (ref 70–99)
Glucose-Capillary: 158 mg/dL — ABNORMAL HIGH (ref 70–99)
Glucose-Capillary: 147 mg/dL — ABNORMAL HIGH (ref 70–99)
Glucose-Capillary: 140 mg/dL — ABNORMAL HIGH (ref 70–99)
Glucose-Capillary: 129 mg/dL — ABNORMAL HIGH (ref 70–99)

## 2019-07-16 LAB — POCT I-STAT 7, (LYTES, BLD GAS, ICA,H+H)
Acid-base deficit: 8 mmol/L — ABNORMAL HIGH (ref 0.0–2.0)
Bicarbonate: 17.2 mmol/L — ABNORMAL LOW (ref 20.0–28.0)
Calcium, Ion: 1.16 mmol/L (ref 1.15–1.40)
HCT: 30 % — ABNORMAL LOW (ref 39.0–52.0)
Hemoglobin: 10.2 g/dL — ABNORMAL LOW (ref 13.0–17.0)
O2 Saturation: 95 %
Patient temperature: 98.2
Potassium: 4.4 mmol/L (ref 3.5–5.1)
Sodium: 148 mmol/L — ABNORMAL HIGH (ref 135–145)
TCO2: 18 mmol/L — ABNORMAL LOW (ref 22–32)
pCO2 arterial: 32.3 mmHg (ref 32.0–48.0)
pH, Arterial: 7.335 — ABNORMAL LOW (ref 7.350–7.450)
pO2, Arterial: 76 mmHg — ABNORMAL LOW (ref 83.0–108.0)

## 2019-07-16 LAB — CBC
HCT: 30.6 % — ABNORMAL LOW (ref 39.0–52.0)
Hemoglobin: 9.8 g/dL — ABNORMAL LOW (ref 13.0–17.0)
MCH: 31.9 pg (ref 26.0–34.0)
MCHC: 32 g/dL (ref 30.0–36.0)
MCV: 99.7 fL (ref 80.0–100.0)
Platelets: 108 10*3/uL — ABNORMAL LOW (ref 150–400)
RBC: 3.07 MIL/uL — ABNORMAL LOW (ref 4.22–5.81)
RDW: 15.9 % — ABNORMAL HIGH (ref 11.5–15.5)
WBC: 13.6 10*3/uL — ABNORMAL HIGH (ref 4.0–10.5)
nRBC: 1.5 % — ABNORMAL HIGH (ref 0.0–0.2)

## 2019-07-16 LAB — TRIGLYCERIDES: Triglycerides: 61 mg/dL (ref <150)

## 2019-07-16 MED ORDER — INSULIN GLARGINE 100 UNIT/ML ~~LOC~~ SOLN
14.0000 [IU] | Freq: Every day | SUBCUTANEOUS | Status: DC
  Administered 2019-07-16 – 2019-07-17 (×2): 14 [IU] via SUBCUTANEOUS
  Filled 2019-07-16 (×3): qty 0.14

## 2019-07-16 MED ORDER — CHLORHEXIDINE GLUCONATE 0.12% ORAL RINSE (MEDLINE KIT)
15.0000 mL | Freq: Two times a day (BID) | OROMUCOSAL | Status: DC
  Administered 2019-07-16 – 2019-07-25 (×20): 15 mL via OROMUCOSAL

## 2019-07-16 MED ORDER — SENNA 8.6 MG PO TABS
1.0000 | ORAL_TABLET | Freq: Two times a day (BID) | ORAL | Status: DC
  Administered 2019-07-17 – 2019-07-18 (×4): 8.6 mg
  Filled 2019-07-16 (×5): qty 1

## 2019-07-16 MED ORDER — FREE WATER
100.0000 mL | Freq: Three times a day (TID) | Status: DC
  Administered 2019-07-16 – 2019-07-23 (×21): 100 mL

## 2019-07-16 MED ORDER — SODIUM CHLORIDE 0.9% FLUSH
10.0000 mL | Freq: Two times a day (BID) | INTRAVENOUS | Status: DC
  Administered 2019-07-16 – 2019-07-17 (×3): 10 mL

## 2019-07-16 MED ORDER — CLONAZEPAM 0.25 MG PO TBDP: 0.2500 mg | ORAL_TABLET | Freq: Two times a day (BID) | ORAL | Status: DC

## 2019-07-16 MED ORDER — FUROSEMIDE 10 MG/ML IJ SOLN
60.0000 mg | Freq: Two times a day (BID) | INTRAMUSCULAR | Status: AC
  Administered 2019-07-16 – 2019-07-17 (×2): 60 mg via INTRAVENOUS
  Filled 2019-07-16 (×2): qty 6

## 2019-07-16 MED ORDER — PHENYLEPHRINE HCL-NACL 40-0.9 MG/250ML-% IV SOLN
0.0000 ug/min | INTRAVENOUS | Status: DC
  Administered 2019-07-16: 80 ug/min via INTRAVENOUS
  Administered 2019-07-17: 60 ug/min via INTRAVENOUS
  Filled 2019-07-16 (×2): qty 250

## 2019-07-16 MED ORDER — CLONAZEPAM 0.25 MG PO TBDP
0.2500 mg | ORAL_TABLET | Freq: Two times a day (BID) | ORAL | Status: DC
  Administered 2019-07-17 – 2019-07-25 (×17): 0.25 mg
  Filled 2019-07-16 (×18): qty 1

## 2019-07-16 MED ORDER — CLONAZEPAM 0.5 MG PO TABS: 0.2500 mg | ORAL_TABLET | Freq: Two times a day (BID) | ORAL | Status: DC

## 2019-07-16 MED ORDER — ORAL CARE MOUTH RINSE
15.0000 mL | OROMUCOSAL | Status: DC
  Administered 2019-07-16 – 2019-07-25 (×90): 15 mL via OROMUCOSAL

## 2019-07-16 MED ORDER — DEXTROSE 5 % IV SOLN
INTRAVENOUS | Status: DC
  Administered 2019-07-16 – 2019-07-17 (×2): via INTRAVENOUS

## 2019-07-16 MED ORDER — CLONAZEPAM 0.1 MG/ML ORAL SUSPENSION: 0.2500 mg | Freq: Two times a day (BID) | ORAL | Status: DC

## 2019-07-16 MED ORDER — DOCUSATE SODIUM 50 MG/5ML PO LIQD
100.0000 mg | Freq: Two times a day (BID) | ORAL | Status: DC
  Administered 2019-07-17 – 2019-07-25 (×18): 100 mg
  Filled 2019-07-16 (×19): qty 10

## 2019-07-16 MED ORDER — SODIUM CHLORIDE 0.9% FLUSH: 10.0000 mL | INTRAVENOUS | Status: DC | PRN

## 2019-07-16 NOTE — Progress Notes (Signed)
PT Cancellation Note  Patient Details Name: Lucas Price MRN: [id number] DOB: Sep 07, 1940   Cancelled Treatment:    Reason Eval/Treat Not Completed: Patient not medically ready (remains on ventilator, pressors. Per RN, not appropriate for therapy today).  Ellamae Sia, PT, DPT Acute Rehabilitation Services Pager [phone] Office [phone]    Willy Eddy 07/16/2019, 8:56 AM

## 2019-07-16 NOTE — Anesthesia Postprocedure Evaluation (Signed)
Anesthesia Post Note  Patient: KATAI MARSICO  Procedure(s) Performed: THORACIC TEN - LUMBAR THREE  FUSION (N/A Spine Lumbar) APPLICATION OF ROBOTIC ASSISTANCE FOR SPINAL PROCEDURE (N/A )     Patient location during evaluation: SICU Anesthesia Type: General Level of consciousness: sedated Pain management: pain level controlled Vital Signs Assessment: post-procedure vital signs reviewed and stable Respiratory status: patient remains intubated per anesthesia plan Cardiovascular status: stable Postop Assessment: no apparent nausea or vomiting Anesthetic complications: no    Last Vitals:  Vitals:   07/16/19 0830 07/16/19 0900  BP: (!) 100/51 (!) 93/50  Pulse: (!) 108 (!) 109  Resp: 15 13  Temp:    SpO2: 92% 93%    Last Pain:  Vitals:   07/16/19 0800  TempSrc: Axillary  PainSc:                  Effie Berkshire

## 2019-07-16 NOTE — Progress Notes (Signed)
Spoke with Marni Griffon about CPT and he said it was ok. Also wants me to try weaning. Will attempt

## 2019-07-16 NOTE — Progress Notes (Signed)
Peripherally Inserted Central Catheter/Midline Placement  The IV Nurse has discussed with the patient and/or persons authorized to consent for the patient, the purpose of this procedure and the potential benefits and risks involved with this procedure.  The benefits include less needle sticks, lab draws from the catheter, and the patient may be discharged home with the catheter. Risks include, but not limited to, infection, bleeding, blood clot (thrombus formation), and puncture of an artery; nerve damage and irregular heartbeat and possibility to perform a PICC exchange if needed/ordered by physician.  Alternatives to this procedure were also discussed.  Bard Power PICC patient education guide, fact sheet on infection prevention and patient information card has been provided to patient /or left at bedside.  Son at bedside, signed consent, denies questions.  Pt alert, intubated, also nodded agreement to PICC placement.  PICC/Midline Placement Documentation  PICC Double Lumen 07/16/19 PICC Right Brachial 42 cm 0 cm (Active)  Indication for Insertion or Continuance of Line Vasoactive infusions 07/16/19 1800  Exposed Catheter (cm) 0 cm 07/16/19 1800  Site Assessment Clean;Dry;Intact 07/16/19 1800  Lumen #1 Status Flushed;Saline locked;Blood return noted 07/16/19 1800  Lumen #2 Status Flushed;Saline locked;Blood return noted 07/16/19 1800  Dressing Type Transparent 07/16/19 1800  Dressing Status Clean;Dry;Intact;Antimicrobial disc in place 07/16/19 1800  [place] checked and tightened 07/16/19 1800  Line Adjustment (NICU/IV Team Only) No 07/16/19 1800  Dressing Intervention New dressing 07/16/19 1800  Dressing Change Due 07/23/19 07/16/19 1800       Rolena Infante 07/16/2019, 6:49 PM

## 2019-07-16 NOTE — Plan of Care (Signed)
  Problem: Clinical Measurements: Goal: Diagnostic test results will improve Outcome: Progressing   

## 2019-07-16 NOTE — Progress Notes (Signed)
Subjective: The patient is alert and in no apparent distress.  By report, at times he is a bit agitated.  Objective: Vital signs in last 24 hours: Temp:  [94.4 F (34.7 C)-98.5 F (36.9 C)] 98.5 F (36.9 C) (12/05 0800) Pulse Rate:  [78-110] 108 (12/05 0830) Resp:  [0-42] 15 (12/05 0830) BP: (96-140)/(37-68) 100/51 (12/05 0830) SpO2:  [82 %-100 %] 92 % (12/05 0830) Arterial Line BP: (86-154)/(41-60) 120/54 (12/05 0730) FiO2 (%):  [40 %-100 %] 40 % (12/05 0813) Estimated body mass index is 30.66 kg/m as calculated from the following:   Height as of this encounter: 5\' 3"  (1.6 m).   Weight as of this encounter: 78.5 kg.   Intake/Output from previous day: 12/04 0701 - 12/05 0700 In: 3586.8 [I.V.:2167.9; Blood:319; IV Piggyback:1099.8] Out: 1985 [LPFXT:0240; Drains:125; Blood:500] Intake/Output this shift: No intake/output data recorded.  Physical exam Glasgow Coma Scale 11 intubated.  The patient is quadriparetic but moves all 4 extremities.  His dressing is clean and dry.  His neck is a bit swollen without obvious hematoma or shift.  His JP drain has put out 125 cc overnight.  Lab Results: Recent Labs    08/02/2019 2000  07/16/19 0608 07/16/19 0805  WBC 20.7*  --  13.6*  --   HGB 9.9*   < > 9.8* 10.2*  HCT 31.6*   < > 30.6* 30.0*  PLT 120*  --  108*  --    < > = values in this interval not displayed.   BMET Recent Labs    07/28/2019 2000  07/16/19 0608 07/16/19 0805  NA 142   < > 148* 148*  K 5.3*   < > 4.6 4.4  CL 116*  --  120*  --   CO2 14*  --  18*  --   GLUCOSE 200*  --  201*  --   BUN 58*  --  60*  --   CREATININE 2.10*  --  2.08*  --   CALCIUM 8.3*  --  7.7*  --    < > = values in this interval not displayed.    Studies/Results: Dg Lumbar Spine 2-3 Views  Result Date: 08/11/2019 CLINICAL DATA:  Posterior laminectomy and fusion with MA Z OR EXAM: LUMBAR SPINE - 2-3 VIEW COMPARISON:  CT evaluation of 07/14/2019 FINDINGS: Initial image shows a template in  place in AP view. Lateral view of the spine shows placement of bilateral posterior spinal fusion extending from T10 through L3 spanning the widened intervertebral disc space at the T12-L1 level. No hardware complication is identified. Fluoroscopic guidance was provided for performance of this procedure. IMPRESSION: Intraoperative imaging during T10 through L3 posterior spinal fusion. No evidence of hardware complication or other acute abnormality. Electronically Signed   By: Zetta Bills M.D.   On: 08/07/2019 19:21   Dg Chest Port 1 View  Result Date: 07/14/2019 CLINICAL DATA:  Respiratory distress. EXAM: PORTABLE CHEST 1 VIEW COMPARISON:  CT 08/03/2019. FINDINGS: Cardiomegaly. Bilateral pulmonary infiltrates/edema with bilateral pleural effusions. Findings most consistent CHF. Dense bibasilar atelectasis. No pneumothorax. Prior cervicothoracic spine fusion. IMPRESSION: 1. Cardiomegaly. Diffuse bilateral pulmonary infiltrates/edema with bilateral pleural effusions. Findings most consistent with CHF. 2.  Dense bibasilar atelectasis. Electronically Signed   By: Marcello Moores  Register   On: 07/14/2019 09:34   Dg C-arm 1-60 Min  Result Date: 07/30/2019 CLINICAL DATA:  T10-L3 fusion EXAM: DG C-ARM 1-60 MIN FLUOROSCOPY TIME:  Fluoroscopy Time:  1 minutes 1 second Number of Acquired Spot  Images: 3 COMPARISON:  None. FINDINGS: Pedicle rods and screws been placed at 6 levels. The levels involved cannot be determined on provided imaging. However, history states the surgery is from T10 through L3. Hardware appears to be in good position. A vertically oriented linear density projects over the fourth vertebral body to the left of midline on the frontal view only of uncertain etiology or significance. IMPRESSION: 1. Pedicle rods and screws placed over 6 levels as above. Hardware is in good position. 2. There is a vertically oriented density projected over the fourth involve vertebral body to the left of midline of uncertain  etiology or significance. Recommend clinical correlation. Electronically Signed   By: Dorise Bullion III M.D   On: 07/13/2019 19:18    Assessment/Plan: Postop day #1: I will continue his JP drain for now.  I will likely discontinue it tomorrow.  He can sit up to 45 degrees to help with his pulmonary status.  LOS: 3 days     Lucas Price 07/16/2019, 8:59 AM

## 2019-07-16 NOTE — Progress Notes (Signed)
Patient weaned a greater part of the morning. MD must have returned to full support, as he was on full support when I arrived. RT to monitor as needed

## 2019-07-16 NOTE — Progress Notes (Signed)
NAME:  ILARIO DHALIWAL, MRN:  [id number], DOB:  13-Mar-1941, LOS: 3 ADMISSION DATE:  08/07/2019, CONSULTATION DATE:  12/3 REFERRING MD:  Dr. Christella Noa, CHIEF COMPLAINT:  Hypotension   Brief History   78 year old male admitted 12/1 after fall and found to have central cord syndrome with stenosis C5-T1. Taken urgently for decompression 12/2. In the early AM hours he became hypotensive and PCCM was consulted.   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  Significant Hospital Events   12/1 fall while climbing stairs, present to ED 12/2 found to have central cord syndrome, taken to OR for decompression.  12/3 Hypotensive, PCCM consult.  12/4 more hypoxic. Requiring HFO2 at 15 liters.  went back to OR  for Lumbar 3 fusion of unstable lumbar fracture. Returned to ICU on vent  12/5 weaning but lower Vts, upper extremity strength seems improved.  Consults:  Cardiology NSG PCCM  Procedures:  12/3: ACDF C5-T1  Significant Diagnostic Tests:  CT head Cspine 12/1 > Mild posterior vertex scalp soft tissue injury without underlying fracture. No acute traumatic injury to the brain identified. Suggestion of disproportionate biparietal cerebral volume loss, non-specific but can be seen with dementia. CT chest 12/2 > new  GGO in [place] and throughout R lung. Unchanged nodules from 2019. New age-indeterminate widening of the anterior interdisc space at T12-L1 which is partially visualized and concerning for fracture of the anterior flowing osteophyte. MRI cervical spine 12/2 > Acute disruption of the anterior longitudinal ligament at C5-C6 with a acute fracture of the anterior inferior endplate of the C5 osteophyte . Significant prevertebral soft tissue edema and fluid is seen extending from C2 through C6. Marrow edema/osseous contusions involving the C5-C7 anterior vertebral bodies. Interspinous ligamentous injury at C5-C6. Findings suggestive of acute cord contusion seen at C5 with cord edema. No definite epidural hematoma or hemorrhage. 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 MRI lumbar spine 12/2 > Acute fracture through ankylosed spine at the T12-L1 disc space. Disrupted anterior longitudinal ligament, disc, and propagation of the fracture through the posterior elements including the L1 spinous process. This is most likely an unstable injury. Associated paraspinal muscle injury, including a small intramuscular hematoma suspected along the lateral right erector spinae fibers at L2-L3. Transitional lumbosacral anatomy, with sacralized L5 level and vestigial L5-S1 disc space. Superimposed lumbar spine degeneration: advanced lumbar disc and endplate disease with multilevel severe lateral recess stenosis, moderate L3-L4 spinal stenosis.  Micro Data:  covid 19: negative x 2  Antimicrobials:  CTX 12/1 > Azithromycin 12/1 >  Interim history/subjective:  Got last night last night for concern about volume overload. He remains pressor dependent   Objective   Blood pressure (Abnormal) 160/106, pulse (Abnormal) 125, temperature 98.5 F (36.9  C), temperature source Axillary, resp. rate (Abnormal) 21, height 5\' 3"  (1.6 m), weight 78.5 kg, SpO2 100 %.  Vent Mode: CPAP;PSV FiO2 (%):  [40 %-100 %] 40 % Set Rate:  [18 bmp-22 bmp] 22 bmp Vt Set:  [450 mL] 450 mL PEEP:  [5 cmH20] 5 cmH20 Pressure Support:  [12 cmH20] 12 cmH20 Plateau Pressure:  [16 cmH20-21 cmH20] 16 cmH20   Intake/Output Summary (Last 24 hours) at 07/16/2019 1041 Last data filed at 07/16/2019 1000 Gross per 24 hour  Intake 3636.81 ml  Output 2135 ml  Net 1501.81 ml   Filed Weights   08/04/2019 1901 08/09/2019 1355 07/22/2019 1937  Weight: 80.7 kg 80.7 kg 78.5 kg    Examination: General chronically ill-appearing woman lying in bed c-collar is in place dressings intact HEENT normocephalic atraumatic c-collar in place is noted surgical incision intact.  Pupils equal reactive  Pulmonary clear to auscultation equal breath sounds bilaterally tidal volumes in the 300 range on pressure support ventilation 10  Cardiac: Regular rate and rhythm  Abdomen nontender  Extremities warm dry no edema  Neuro awake oriented no focal deficits appreciated cognitively.  Appears as though upper extremity strength is improved.  Lower extremity strength remains weak     Resolved Hospital Problem list   Hyperkalemia; 5.4 this morning Hyperphos  Assessment & Plan:   Acute on chronic hypoxic respiratory failure in setting of bilateral pulmonary infiltrates, likely a mix of atelectasis, possible aspiration and volume overload History of COPD Portable chest x-ray personally reviewed aeration somewhat improved when comparing film from December 3.  Endotracheal tubes in satisfactory position.  There continues to be interstitial changes bilaterally suspect there is a pulmonary edema component bibasilar airspace disease persists cannot exclude pneumonia -Got Lasix last night -Concerned about pulmonary stamina and respiratory accessory muscle support following spinal shock Plan Initiate  spontaneous breathing trial Scheduled bronchodilators Pulmonary hygiene Day 4 of 5 antibiotics Reassess later this afternoon for possible extubation otherwise continue VAP bundle PAD protocol RASS goal 0  Neurogenic shock  Hypotension   -In the setting central cord compression, dilaudid administration, and poor PO intake over the last 24-48 hours Plan Keep euvolemic Continue norepinephrine for mean arterial pressure greater than 65 Minimize sedation Continue telemetry monitoring We will add low-dose midodrine  Central cord syndrome at the level of the cervical spine secondary to fall.  T12-L1 fracture S/p ACDF cervical 12/2 OR for stabilization of thoracolumbar fracture 12/4 Plan As directed by neurosurgery To keep JP drain in another 24 hours Okay to sit up C-collar in place    Fluid and electrolyte imbalance: mild hypernatremia and hyperchloremic metabolic acidosis  Plan Dc NaCl Hold further diuretics today  Will change MIVF to D5W at [facility] If not extubated will place OGT and give free water   AKI on CKD -Baseline creatinine 1.8, slowly improving Plan Avoid hypotension Avoid nephrotoxins Strict intake output A.m. chemistry Hold allopurinol  Urinary retention Plan Keep Foley in place  IDDM Plan Continue sliding scale  CAD history Plan As directed by cardiology  Best practice:  Diet: NPO Pain/Anxiety/Delirium protocol (if indicated): NA VAP protocol (if indicated): NA DVT prophylaxis: per primary GI prophylaxis: per primary Glucose control: SSI Mobility: BR Code Status: FULL Family Communication: Patient updated Disposition: ICU, working on weaning  Critical care x32 minutes  Erick Colace ACNP-BC [place] Pager # [phone] OR # [phone] if no answer

## 2019-07-16 NOTE — Progress Notes (Signed)
RT to hold off on CPT until I speak with MD. Marena Chancy if it is still ok to do with his recent spinal surgery. Will monitor as needed

## 2019-07-17 ENCOUNTER — Inpatient Hospital Stay (HOSPITAL_COMMUNITY): Payer: Medicare Other

## 2019-07-17 DIAGNOSIS — J9601 Acute respiratory failure with hypoxia: Secondary | ICD-10-CM | POA: Diagnosis not present

## 2019-07-17 DIAGNOSIS — J9811 Atelectasis: Secondary | ICD-10-CM

## 2019-07-17 LAB — BASIC METABOLIC PANEL
Anion gap: 12 (ref 5–15)
BUN: 65 mg/dL — ABNORMAL HIGH (ref 8–23)
CO2: 19 mmol/L — ABNORMAL LOW (ref 22–32)
Calcium: 7.7 mg/dL — ABNORMAL LOW (ref 8.9–10.3)
Chloride: 117 mmol/L — ABNORMAL HIGH (ref 98–111)
Creatinine, Ser: 2.14 mg/dL — ABNORMAL HIGH (ref 0.61–1.24)
GFR calc Af Amer: 34 mL/min — ABNORMAL LOW (ref >60)
GFR calc non Af Amer: 29 mL/min — ABNORMAL LOW (ref >60)
Glucose, Bld: 153 mg/dL — ABNORMAL HIGH (ref 70–99)
Potassium: 4.2 mmol/L (ref 3.5–5.1)
Sodium: 148 mmol/L — ABNORMAL HIGH (ref 135–145)

## 2019-07-17 LAB — POCT I-STAT 7, (LYTES, BLD GAS, ICA,H+H)
Acid-base deficit: 11 mmol/L — ABNORMAL HIGH (ref 0.0–2.0)
Acid-base deficit: 12 mmol/L — ABNORMAL HIGH (ref 0.0–2.0)
Acid-base deficit: 9 mmol/L — ABNORMAL HIGH (ref 0.0–2.0)
Bicarbonate: 15.8 mmol/L — ABNORMAL LOW (ref 20.0–28.0)
Bicarbonate: 16.3 mmol/L — ABNORMAL LOW (ref 20.0–28.0)
Bicarbonate: 17.5 mmol/L — ABNORMAL LOW (ref 20.0–28.0)
Calcium, Ion: 1.14 mmol/L — ABNORMAL LOW (ref 1.15–1.40)
Calcium, Ion: 1.16 mmol/L (ref 1.15–1.40)
Calcium, Ion: 1.18 mmol/L (ref 1.15–1.40)
HCT: 30 % — ABNORMAL LOW (ref 39.0–52.0)
HCT: 30 % — ABNORMAL LOW (ref 39.0–52.0)
HCT: 32 % — ABNORMAL LOW (ref 39.0–52.0)
Hemoglobin: 10.2 g/dL — ABNORMAL LOW (ref 13.0–17.0)
Hemoglobin: 10.2 g/dL — ABNORMAL LOW (ref 13.0–17.0)
Hemoglobin: 10.9 g/dL — ABNORMAL LOW (ref 13.0–17.0)
O2 Saturation: 83 %
O2 Saturation: 87 %
O2 Saturation: 90 %
Patient temperature: 97.6
Patient temperature: 98.3
Patient temperature: 98.6
Potassium: 4.1 mmol/L (ref 3.5–5.1)
Potassium: 4.1 mmol/L (ref 3.5–5.1)
Potassium: 4.2 mmol/L (ref 3.5–5.1)
Sodium: 147 mmol/L — ABNORMAL HIGH (ref 135–145)
Sodium: 148 mmol/L — ABNORMAL HIGH (ref 135–145)
Sodium: 149 mmol/L — ABNORMAL HIGH (ref 135–145)
TCO2: 17 mmol/L — ABNORMAL LOW (ref 22–32)
TCO2: 17 mmol/L — ABNORMAL LOW (ref 22–32)
TCO2: 19 mmol/L — ABNORMAL LOW (ref 22–32)
pCO2 arterial: 38.6 mmHg (ref 32.0–48.0)
pCO2 arterial: 40.4 mmHg (ref 32.0–48.0)
pCO2 arterial: 41.1 mmHg (ref 32.0–48.0)
pH, Arterial: 7.198 — CL (ref 7.350–7.450)
pH, Arterial: 7.231 — ABNORMAL LOW (ref 7.350–7.450)
pH, Arterial: 7.238 — ABNORMAL LOW (ref 7.350–7.450)
pO2, Arterial: 54 mmHg — ABNORMAL LOW (ref 83.0–108.0)
pO2, Arterial: 64 mmHg — ABNORMAL LOW (ref 83.0–108.0)
pO2, Arterial: 69 mmHg — ABNORMAL LOW (ref 83.0–108.0)

## 2019-07-17 LAB — BPAM RBC
Blood Product Expiration Date: 202101062359
Blood Product Expiration Date: 202101062359
Blood Product Expiration Date: 202101062359
Blood Product Expiration Date: 202101062359
ISSUE DATE / TIME: 202012041622
ISSUE DATE / TIME: 202012041622
ISSUE DATE / TIME: 202012041622
ISSUE DATE / TIME: 202012041622
Unit Type and Rh: 5100
Unit Type and Rh: 5100
Unit Type and Rh: 5100
Unit Type and Rh: 5100

## 2019-07-17 LAB — TYPE AND SCREEN
ABO/RH(D): O POS
Antibody Screen: NEGATIVE
Unit division: 0
Unit division: 0
Unit division: 0
Unit division: 0

## 2019-07-17 LAB — GLUCOSE, CAPILLARY
Glucose-Capillary: 133 mg/dL — ABNORMAL HIGH (ref 70–99)
Glucose-Capillary: 127 mg/dL — ABNORMAL HIGH (ref 70–99)
Glucose-Capillary: 155 mg/dL — ABNORMAL HIGH (ref 70–99)
Glucose-Capillary: 181 mg/dL — ABNORMAL HIGH (ref 70–99)
Glucose-Capillary: 227 mg/dL — ABNORMAL HIGH (ref 70–99)
Glucose-Capillary: 238 mg/dL — ABNORMAL HIGH (ref 70–99)

## 2019-07-17 LAB — CBC
HCT: 33.1 % — ABNORMAL LOW (ref 39.0–52.0)
Hemoglobin: 10.5 g/dL — ABNORMAL LOW (ref 13.0–17.0)
MCH: 31.3 pg (ref 26.0–34.0)
MCHC: 31.7 g/dL (ref 30.0–36.0)
MCV: 98.8 fL (ref 80.0–100.0)
Platelets: 123 10*3/uL — ABNORMAL LOW (ref 150–400)
RBC: 3.35 MIL/uL — ABNORMAL LOW (ref 4.22–5.81)
RDW: 16.5 % — ABNORMAL HIGH (ref 11.5–15.5)
WBC: 17.4 10*3/uL — ABNORMAL HIGH (ref 4.0–10.5)
nRBC: 3.5 % — ABNORMAL HIGH (ref 0.0–0.2)

## 2019-07-17 LAB — TRIGLYCERIDES: Triglycerides: 96 mg/dL (ref <150)

## 2019-07-17 LAB — LACTIC ACID, PLASMA
Lactic Acid, Venous: 1.7 mmol/L (ref 0.5–1.9)
Lactic Acid, Venous: 1.9 mmol/L (ref 0.5–1.9)

## 2019-07-17 LAB — PHOSPHORUS
Phosphorus: 4.2 mg/dL (ref 2.5–4.6)
Phosphorus: 4.4 mg/dL (ref 2.5–4.6)

## 2019-07-17 LAB — MAGNESIUM
Magnesium: 2.1 mg/dL (ref 1.7–2.4)
Magnesium: 2.1 mg/dL (ref 1.7–2.4)

## 2019-07-17 MED ORDER — ACETAMINOPHEN 325 MG PO TABS
650.0000 mg | ORAL_TABLET | ORAL | Status: DC | PRN
  Administered 2019-07-17 – 2019-07-19 (×2): 650 mg
  Filled 2019-07-17 (×2): qty 2

## 2019-07-17 MED ORDER — DOBUTAMINE IN D5W 4-5 MG/ML-% IV SOLN
2.5000 ug/kg/min | INTRAVENOUS | Status: DC
  Administered 2019-07-17: 2.5 ug/kg/min via INTRAVENOUS
  Filled 2019-07-17: qty 250

## 2019-07-17 MED ORDER — METHOCARBAMOL 500 MG PO TABS: 500.0000 mg | ORAL_TABLET | Freq: Four times a day (QID) | ORAL | Status: DC | PRN

## 2019-07-17 MED ORDER — ACETAMINOPHEN 650 MG RE SUPP
650.0000 mg | RECTAL | Status: DC | PRN
  Administered 2019-07-18: 650 mg via RECTAL
  Filled 2019-07-17: qty 1

## 2019-07-17 MED ORDER — MIDAZOLAM HCL 2 MG/2ML IJ SOLN
INTRAMUSCULAR | Status: AC
  Administered 2019-07-17: 2 mg
  Filled 2019-07-17: qty 4

## 2019-07-17 MED ORDER — FUROSEMIDE 10 MG/ML IJ SOLN
60.0000 mg | Freq: Two times a day (BID) | INTRAMUSCULAR | Status: DC
  Administered 2019-07-18: 60 mg via INTRAVENOUS
  Filled 2019-07-17 (×2): qty 6

## 2019-07-17 MED ORDER — AMIODARONE LOAD VIA INFUSION: 150.0000 mg | Freq: Once | INTRAVENOUS | Status: DC

## 2019-07-17 MED ORDER — AMIODARONE LOAD VIA INFUSION
150.0000 mg | Freq: Once | INTRAVENOUS | Status: AC
  Administered 2019-07-17: 150 mg via INTRAVENOUS
  Filled 2019-07-17: qty 83.34

## 2019-07-17 MED ORDER — VASOPRESSIN 20 UNIT/ML IV SOLN
0.0100 [IU]/min | INTRAVENOUS | Status: DC
  Filled 2019-07-17 (×2): qty 2

## 2019-07-17 MED ORDER — HYDROCORTISONE NA SUCCINATE PF 100 MG IJ SOLR
50.0000 mg | Freq: Four times a day (QID) | INTRAMUSCULAR | Status: DC
  Administered 2019-07-17 – 2019-07-20 (×11): 50 mg via INTRAVENOUS
  Filled 2019-07-17 (×12): qty 2

## 2019-07-17 MED ORDER — AMIODARONE HCL IN DEXTROSE 360-4.14 MG/200ML-% IV SOLN
INTRAVENOUS | Status: AC
  Filled 2019-07-17: qty 200

## 2019-07-17 MED ORDER — CHLORHEXIDINE GLUCONATE CLOTH 2 % EX PADS
6.0000 | MEDICATED_PAD | Freq: Every day | CUTANEOUS | Status: DC
  Administered 2019-07-17 – 2019-07-25 (×9): 6 via TOPICAL

## 2019-07-17 MED ORDER — CALCIUM CARBONATE 1250 (500 CA) MG PO TABS
1.0000 | ORAL_TABLET | Freq: Every day | ORAL | Status: DC
  Administered 2019-07-17 – 2019-07-25 (×9): 500 mg
  Filled 2019-07-17 (×9): qty 1

## 2019-07-17 MED ORDER — AMIODARONE LOAD VIA INFUSION
150.0000 mg | Freq: Once | INTRAVENOUS | Status: AC
  Administered 2019-07-17: 150 mg via INTRAVENOUS

## 2019-07-17 MED ORDER — NOREPINEPHRINE 16 MG/250ML-% IV SOLN
0.0000 ug/min | INTRAVENOUS | Status: DC
  Administered 2019-07-17: 10 ug/min via INTRAVENOUS
  Administered 2019-07-18 (×2): 40 ug/min via INTRAVENOUS
  Administered 2019-07-18: 52 ug/min via INTRAVENOUS
  Administered 2019-07-18: 40 ug/min via INTRAVENOUS
  Administered 2019-07-19: 38 ug/min via INTRAVENOUS
  Administered 2019-07-19: 22:00:00 25 ug/min via INTRAVENOUS
  Administered 2019-07-20: 33 ug/min via INTRAVENOUS
  Administered 2019-07-20 (×2): 35 ug/min via INTRAVENOUS
  Administered 2019-07-20: 40 ug/min via INTRAVENOUS
  Administered 2019-07-21: 32 ug/min via INTRAVENOUS
  Administered 2019-07-21: 55 ug/min via INTRAVENOUS
  Administered 2019-07-21: 30 ug/min via INTRAVENOUS
  Administered 2019-07-22 (×2): 60 ug/min via INTRAVENOUS
  Administered 2019-07-22: 70 ug/min via INTRAVENOUS
  Administered 2019-07-22: 60 ug/min via INTRAVENOUS
  Administered 2019-07-22 – 2019-07-25 (×19): 70 ug/min via INTRAVENOUS
  Administered 2019-07-25: 21.333 ug/min via INTRAVENOUS
  Filled 2019-07-17 (×19): qty 250
  Filled 2019-07-17: qty 500
  Filled 2019-07-17 (×17): qty 250

## 2019-07-17 MED ORDER — METHOCARBAMOL 1000 MG/10ML IJ SOLN
500.0000 mg | Freq: Four times a day (QID) | INTRAVENOUS | Status: DC | PRN
  Filled 2019-07-17: qty 5

## 2019-07-17 MED ORDER — VITAMIN D 25 MCG (1000 UNIT) PO TABS
5000.0000 [IU] | ORAL_TABLET | Freq: Every day | ORAL | Status: DC
  Administered 2019-07-18 – 2019-07-25 (×8): 5000 [IU]
  Filled 2019-07-17 (×9): qty 5

## 2019-07-17 MED ORDER — HYDROCODONE-ACETAMINOPHEN 5-325 MG PO TABS: 1.0000 | ORAL_TABLET | ORAL | Status: DC | PRN

## 2019-07-17 MED ORDER — PRO-STAT SUGAR FREE PO LIQD
30.0000 mL | Freq: Two times a day (BID) | ORAL | Status: DC
  Administered 2019-07-17 – 2019-07-25 (×18): 30 mL
  Filled 2019-07-17 (×18): qty 30

## 2019-07-17 MED ORDER — MILRINONE LACTATE IN DEXTROSE 20-5 MG/100ML-% IV SOLN
0.1250 ug/kg/min | INTRAVENOUS | Status: DC
  Administered 2019-07-17: 0.125 ug/kg/min via INTRAVENOUS
  Filled 2019-07-17 (×2): qty 100

## 2019-07-17 MED ORDER — SODIUM BICARBONATE 8.4 % IV SOLN
100.0000 meq | Freq: Once | INTRAVENOUS | Status: AC
  Administered 2019-07-17: 18:00:00 100 meq via INTRAVENOUS

## 2019-07-17 MED ORDER — VANCOMYCIN HCL IN DEXTROSE 1-5 GM/200ML-% IV SOLN
1000.0000 mg | INTRAVENOUS | Status: DC
  Filled 2019-07-17: qty 200

## 2019-07-17 MED ORDER — AMIODARONE IV BOLUS ONLY 150 MG/100ML
150.0000 mg | Freq: Once | INTRAVENOUS | Status: AC
  Administered 2019-07-17: 150 mg via INTRAVENOUS
  Filled 2019-07-17: qty 100

## 2019-07-17 MED ORDER — VANCOMYCIN HCL 10 G IV SOLR
1500.0000 mg | Freq: Once | INTRAVENOUS | Status: AC
  Administered 2019-07-17: 1500 mg via INTRAVENOUS
  Filled 2019-07-17: qty 1500

## 2019-07-17 MED ORDER — NOREPINEPHRINE 4 MG/250ML-% IV SOLN
0.0000 ug/min | INTRAVENOUS | Status: DC
  Administered 2019-07-17: 24 ug/min via INTRAVENOUS
  Administered 2019-07-17 (×2): 40 ug/min via INTRAVENOUS
  Administered 2019-07-17: 28 ug/min via INTRAVENOUS
  Filled 2019-07-17: qty 250
  Filled 2019-07-17: qty 500
  Filled 2019-07-17 (×2): qty 250

## 2019-07-17 MED ORDER — AMIODARONE HCL IN DEXTROSE 360-4.14 MG/200ML-% IV SOLN
60.0000 mg/h | INTRAVENOUS | Status: AC
  Administered 2019-07-17: 60 mg/h via INTRAVENOUS
  Filled 2019-07-17 (×2): qty 200

## 2019-07-17 MED ORDER — FUROSEMIDE 10 MG/ML IJ SOLN
80.0000 mg | Freq: Once | INTRAMUSCULAR | Status: AC
  Administered 2019-07-17: 80 mg via INTRAVENOUS
  Filled 2019-07-17: qty 8

## 2019-07-17 MED ORDER — ATORVASTATIN CALCIUM 10 MG PO TABS
10.0000 mg | ORAL_TABLET | Freq: Every day | ORAL | Status: DC
  Administered 2019-07-17 – 2019-07-25 (×9): 10 mg
  Filled 2019-07-17 (×9): qty 1

## 2019-07-17 MED ORDER — SODIUM BICARBONATE 8.4 % IV SOLN
INTRAVENOUS | Status: AC
  Administered 2019-07-17: 100 meq via INTRAVENOUS
  Filled 2019-07-17: qty 100

## 2019-07-17 MED ORDER — AMIODARONE HCL IN DEXTROSE 360-4.14 MG/200ML-% IV SOLN
30.0000 mg/h | INTRAVENOUS | Status: DC
  Administered 2019-07-17 – 2019-07-25 (×17): 30 mg/h via INTRAVENOUS
  Filled 2019-07-17 (×19): qty 200

## 2019-07-17 MED ORDER — SODIUM CHLORIDE 0.9 % IV SOLN
INTRAVENOUS | Status: DC | PRN
  Administered 2019-07-18: 05:00:00 via INTRA_ARTERIAL

## 2019-07-17 MED ORDER — VITAL HIGH PROTEIN PO LIQD
1000.0000 mL | ORAL | Status: DC
  Administered 2019-07-17: 1000 mL

## 2019-07-17 NOTE — Progress Notes (Signed)
Subjective: The patient failed extubation and was recently reintubated.  Objective: Vital signs in last 24 hours: Temp:  [97.6 F (36.4 C)-98.5 F (36.9 C)] 98.3 F (36.8 C) (12/06 0800) Pulse Rate:  [83-129] 129 (12/06 0945) Resp:  [13-36] 22 (12/06 0945) BP: (77-160)/(46-106) 99/61 (12/06 0945) SpO2:  [90 %-100 %] 90 % (12/06 0945) FiO2 (%):  [40 %-50 %] 40 % (12/06 0941) Estimated body mass index is 30.66 kg/m as calculated from the following:   Height as of this encounter: 5\' 3"  (1.6 m).   Weight as of this encounter: 78.5 kg.   Intake/Output from previous day: 12/05 0701 - 12/06 0700 In: 2852.5 [I.V.:2302.5; NG/GT:200; IV Piggyback:350.1] Out: 2090 [Urine:1950; Drains:140] Intake/Output this shift: Total I/O In: 22.5 [I.V.:22.5] Out: -   Physical exam Glasgow Coma Scale 6, intubated, [id number].  He has recently sedated and does not respond to noxious stimuli.  I removed his lumbar drain.  Lab Results: Recent Labs    07/16/19 0608 07/16/19 0805 07/17/19 0511  WBC 13.6*  --  17.4*  HGB 9.8* 10.2* 10.5*  HCT 30.6* 30.0* 33.1*  PLT 108*  --  123*   BMET Recent Labs    07/16/19 0608 07/16/19 0805 07/17/19 0511  NA 148* 148* 148*  K 4.6 4.4 4.2  CL 120*  --  117*  CO2 18*  --  19*  GLUCOSE 201*  --  153*  BUN 60*  --  65*  CREATININE 2.08*  --  2.14*  CALCIUM 7.7*  --  7.7*    Studies/Results: Dg Lumbar Spine 2-3 Views  Result Date: 07/21/2019 CLINICAL DATA:  Posterior laminectomy and fusion with MA Z OR EXAM: LUMBAR SPINE - 2-3 VIEW COMPARISON:  CT evaluation of 07/14/2019 FINDINGS: Initial image shows a template in place in AP view. Lateral view of the spine shows placement of bilateral posterior spinal fusion extending from T10 through L3 spanning the widened intervertebral disc space at the T12-L1 level. No hardware complication is identified. Fluoroscopic guidance was provided for performance of this procedure. IMPRESSION: Intraoperative imaging during  T10 through L3 posterior spinal fusion. No evidence of hardware complication or other acute abnormality. Electronically Signed   By: Zetta Bills M.D.   On: 08/01/2019 19:21   Dg Abd 1 View  Result Date: 07/16/2019 CLINICAL DATA:  OG tube placed EXAM: ABDOMEN - 1 VIEW COMPARISON:  None. FINDINGS: The tip of the NG tube is seen projecting over the mid body of the stomach. Small bilateral pleural effusions are seen. There is patchy airspace opacity seen at both lung bases. Overlying spinal fixation hardware seen in the lumbar spine. IMPRESSION: Tip the NG tube seen projecting over the mid body of stomach. Electronically Signed   By: Prudencio Pair M.D.   On: 07/16/2019 21:58   Dg Chest Port 1 View  Result Date: 07/16/2019 CLINICAL DATA:  Postop atelectasis EXAM: PORTABLE CHEST 1 VIEW COMPARISON:  07/14/2019 FINDINGS: Endotracheal tube 6.5 cm from [place]ina. Lungs are hyperinflated. Bilateral small pleural effusions. No pneumothorax. No focal consolidation. There are bilateral small pulmonary nodules primarily in the RIGHT lung which correspond to nodularity of comparison CT. IMPRESSION: 1. Endotracheal tube in good position. 2. Bilateral small pleural effusions. 3. Pulmonary nodules unchanged from comparison CT. Electronically Signed   By: Suzy Bouchard M.D.   On: 07/16/2019 11:34   Dg C-arm 1-60 Min  Result Date: 07/17/2019 CLINICAL DATA:  T10-L3 fusion EXAM: DG C-ARM 1-60 MIN FLUOROSCOPY TIME:  Fluoroscopy Time:  1  minutes 1 second Number of Acquired Spot Images: 3 COMPARISON:  None. FINDINGS: Pedicle rods and screws been placed at 6 levels. The levels involved cannot be determined on provided imaging. However, history states the surgery is from T10 through L3. Hardware appears to be in good position. A vertically oriented linear density projects over the fourth vertebral body to the left of midline on the frontal view only of uncertain etiology or significance. IMPRESSION: 1. Pedicle rods and screws  placed over 6 levels as above. Hardware is in good position. 2. There is a vertically oriented density projected over the fourth involve vertebral body to the left of midline of uncertain etiology or significance. Recommend clinical correlation. Electronically Signed   By: Dorise Bullion III M.D   On: 07/31/2019 19:18   [place] Ekg Site Rite  Result Date: 07/16/2019 If Site Rite image not attached, placement could not be confirmed due to current cardiac rhythm.   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  LOS: 4 days     Ophelia Charter 07/17/2019, 10:29 AM

## 2019-07-17 NOTE — Progress Notes (Addendum)
NAME:  Lucas Price, MRN:  [id number], DOB:  03-20-1941, LOS: 4 ADMISSION DATE:  08/01/2019, CONSULTATION DATE:  12/3 REFERRING MD:  Dr. Christella Noa, CHIEF COMPLAINT:  Hypotension   Brief History   78 year old male admitted 12/1 after fall and found to have central cord syndrome with stenosis C5-T1. Taken urgently for decompression 12/2. In the early AM hours he became hypotensive and PCCM was consulted.   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  Significant Hospital Events   12/1 fall while climbing stairs, present to ED 12/2 found to have central cord syndrome, taken to OR for decompression.  12/3 Hypotensive, PCCM consult.  12/4 more hypoxic. Requiring HFO2 at 15 liters.  went back to OR  for Lumbar 3 fusion of unstable lumbar fracture. Returned to ICU on vent  12/5 weaning but lower Vts, upper extremity strength seems improved.  12/6: He remains on low-dose Neo-Synephrine, but also on fentanyl infusion.  Looks comfortable on spontaneous breathing trial and has acceptable frequency/tidal volume ratio for extubation so order has been written to extubate. Was extubated. Was initially awake. Alert. Following commands and in no distress. Then became suddenly unresponsive, acutely hypotensive and began to  brady down. Was emergently re-intubated. Working dx at this point raising concern for sig cardiac component  Consults:  Cardiology NSG PCCM  Procedures:  12/3: ACDF C5-T1  Significant Diagnostic Tests:  CT head Cspine 12/1 > Mild posterior vertex scalp soft tissue injury without underlying fracture. No acute traumatic injury to the brain identified. Suggestion of disproportionate biparietal cerebral volume loss, non-specific but can be seen with dementia. CT chest 12/2 > new GGO in [place] and throughout R lung. Unchanged nodules from 2019. New age-indeterminate widening of the anterior interdisc space at T12-L1 which is partially visualized and concerning for fracture of the anterior flowing osteophyte. MRI cervical spine 12/2 > Acute disruption of the anterior longitudinal ligament at C5-C6 with a acute fracture of the anterior inferior endplate of the C5 osteophyte . Significant prevertebral soft tissue edema and fluid is seen extending from C2 through C6. Marrow edema/osseous contusions involving the C5-C7 anterior vertebral bodies. Interspinous ligamentous injury at C5-C6. Findings suggestive of acute cord contusion seen at C5 with cord edema. No definite epidural hematoma or hemorrhage. 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 MRI lumbar spine 12/2 > Acute fracture through ankylosed spine at the T12-L1 disc space. Disrupted anterior longitudinal ligament, disc, and propagation of the fracture through the posterior elements including the L1 spinous process. This is most likely an unstable injury. Associated paraspinal muscle injury, including a small intramuscular hematoma suspected along the lateral right erector spinae fibers at L2-L3. Transitional lumbosacral anatomy, with sacralized L5 level and vestigial L5-S1  disc space. Superimposed lumbar spine degeneration: advanced lumbar disc and endplate disease with multilevel severe lateral recess stenosis, moderate L3-L4 spinal stenosis.  Micro Data:  covid 19: negative x 2  Antimicrobials:  CTX 12/1 > Azithromycin 12/1 >  Interim history/subjective:  Got last night last night for concern about volume overload. He remains pressor dependent   Objective   Blood pressure 102/62, pulse 97, temperature 98.3 F (36.8 C), temperature source Axillary, resp. rate 16, height 5\' 3"  (1.6 m), weight 78.5 kg, SpO2 96 %.    Vent Mode: CPAP;PSV FiO2 (%):  [40 %] 40 % Set Rate:  [22 bmp] 22 bmp Vt Set:  [450 mL] 450 mL PEEP:  [5 cmH20] 5 cmH20 Pressure Support:  [5 cmH20] 5 cmH20 Plateau Pressure:  [15 cmH20-16 cmH20] 15 cmH20   Intake/Output Summary (Last 24 hours) at 07/17/2019 0845 Last data filed at 07/17/2019 0800 Gross per 24 hour  Intake 2769.98 ml  Output 2040 ml  Net 729.98 ml   Filed Weights   08/08/2019 1901 08/09/2019 1355 07/24/2019 1937  Weight: 80.7 kg 80.7 kg 78.5 kg    Examination:  General this is a very pleasant 78 year old male patient status post both cervical and lumbar surgery he is currently on spontaneous breathing trial on the mechanical ventilator and appears comfortable and ready for extubation HEENT normocephalic atraumatic, c-collar in place, endotracheal tube in place, positive cuff leak assessment.  Anterior cervical dressing intact Pulmonary: Clear bilaterally diminished bases no accessory use.  Tidal volume in the 400 range on pressure support of 5 and PEEP of 5.  No accessory muscle use appreciated Cardiac: Regular rate and rhythm does have systolic murmur best heard over aortic region. Abdomen soft nontender no organomegaly is appreciated GU clear yellow Neuro awake, follows commands, upper extremity strength is improving, he is profoundly weak lower extremities unable to lift against gravity.  Resolved Hospital Problem  list   Hyperkalemia; 5.4 this morning Hyperphos  Assessment & Plan:   Acute on chronic hypoxic respiratory failure in setting of bilateral pulmonary infiltrates, likely a mix of atelectasis, possible aspiration and volume overload History of COPD Portable chest x-ray personally reviewed: From 12/5 this demonstrated endotracheal tube in satisfactory position there was what appeared to be right greater than left effusion/atelectasis -Excellent tidal volume on pressure support ventilation but failed extubation Plan Resume full vent support; will need trach  VAP bundle PAD protocol with RASS goal -1 Day #5 5 antibiotics Continue bronchodilators A.m. chest x-ray  Neurogenic shock & suspect component of Cardiogenic shock  -In the setting central cord compression, dilaudid administration, and poor PO intake over the last 24-48 hours Plan Keeping euvolemic Bedside echo Change Neo to norepinephrine   afib w/ RVR Plan Amiodarone bolus May need gtt Cont tele   Central cord syndrome at the level of the cervical spine secondary to fall.  T12-L1 fracture S/p ACDF cervical 12/2 OR for stabilization of thoracolumbar fracture 12/4 Plan As directed by neurosurgery  C-collar in place  Serial neuro checks   Fluid and electrolyte imbalance: mild hypernatremia and hyperchloremic metabolic acidosis  Plan Adjust free water replacement; changed to D5 water to 45 mL an hour Holding further diuresis for now A.m. chemistry  AKI on CKD -Baseline creatinine 1.8, slowly improving Plan Avoid hypotension Avoid nephrotoxins Intake output Holding allopurinol Hold diuresis A.m. chemistry  Urinary retention Plan Keep Foley catheter in place  IDDM Plan Continue sliding scale insulin  CAD history Plan Additional recommendations per cardiology, continue statin, resume aspirin when able Best practice:  Pain/Anxiety/Delirium protocol (if indicated): NA VAP protocol (if indicated): NA DVT  prophylaxis: per primary GI  prophylaxis: per primary Glucose control: SSI Mobility: BR Code Status: FULL Family Communication: Patient updated Disposition: ICU, working on weaning  Critical care x32 minutes  Erick Colace ACNP-BC [place] Pager # [phone] OR # [phone] if no answer

## 2019-07-17 NOTE — Progress Notes (Signed)
RN added dobutamine gtt and titrated gtt up to help meet BP goal. BP continue to drop, with SBP in the 50's. RN called MD,and made aware that the dobutamine gtt had been initiated and his BP started dropping after the vasopressors had been paused briefly to draw labs and change tubing. Verbal orders received to dc dobutamine gtt and to continue levophed only at this time. Dobutamine gtt has been stopped. Levophed currently maxed out at 79. Pt remains tachycardic, in afib with HR in the 170s. MD arrived at bedisde and gave new orders to add vasopressin and amiodarone gtt at this time.

## 2019-07-17 NOTE — Progress Notes (Signed)
Called to room for decrease in SAT. Placed on 100%. Would have recommended increasing PEEP, but BP is soft. RT to monitor.

## 2019-07-17 NOTE — Procedures (Signed)
Arterial Catheter Insertion Procedure Note JOHNTA COUTS [id number] [birth year]  Procedure: Insertion of Arterial Catheter  Indications: Blood pressure monitoring  Procedure Details Consent: Unable to obtain consent because of emergent medical necessity. Time Out: Verified patient identification, verified procedure, site/side was marked, verified correct patient position, special equipment/implants available, medications/allergies/relevent history reviewed, required imaging and test results available.  Performed  Maximum sterile technique was used including antiseptics. Skin prep: Chlorhexidine; local anesthetic administered 20 gauge catheter was inserted into left radial artery using the Seldinger technique. ULTRASOUND GUIDANCE USED: NO Evaluation Blood flow good; BP tracing good. Complications: No apparent complications.   Pierre Bali 07/17/2019

## 2019-07-17 NOTE — Progress Notes (Signed)
Brief Nutrition Note RD working remotely.  Consult received for enteral/tube feeding initiation and management.  Adult Enteral Nutrition Protocol initiated. Full assessment to follow.  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 Pt meets criteria for obesity class I based on current BMI.  Labs:  Recent Labs  Lab 07/14/19 0427  08/09/2019 2000  07/16/19 1594 07/16/19 0805 07/17/19 0511 07/17/19 1231  NA 139   < > 142   < > 148* 148* 148* 149*  K 5.4*   < > 5.3*   < > 4.6 4.4 4.2 4.2  CL 107   < > 116*  --  120*  --  117*  --   CO2 22   < > 14*  --  18*  --  19*  --   BUN 58*   < > 58*  --  60*  --  65*  --   CREATININE 2.00*   < > 2.10*  --  2.08*  --  2.14*  --   CALCIUM 8.1*   < > 8.3*  --  7.7*  --  7.7*  --   MG 2.3  --   --   --   --   --   --   --   PHOS 5.9*  --   --   --   --   --   --   --   GLUCOSE 189*   < > 200*  --  201*  --  153*  --    < > = values in this interval not displayed.    Jacklynn Barnacle, MS, RD, LDN Office: [phone] Pager: [phone] After Hours/Weekend Pager: [phone]

## 2019-07-17 NOTE — Progress Notes (Signed)
PT Cancellation Note  Patient Details Name: Lucas Price MRN: [id number] DOB: [birth year]   Cancelled Treatment:    Reason Eval/Treat Not Completed: Patient not medically ready (preparing to extubate).  Ellamae Sia, PT, DPT Acute Rehabilitation Services Pager [phone] Office [phone]    Willy Eddy 07/17/2019, 9:20 AM

## 2019-07-17 NOTE — Progress Notes (Signed)
Critical care attending:  Hypotension following interruption of PE and DOB for lab draw. HR 160's with uncontrolled atrial fibrillation.  Re-loaded with amiodarone and started infusion.   Discussed best options for management of decompensated RV failure with Dr Haroldine Laws who recommended NE and MIL   On re-assessement, BP improved on NE alone once HR controlled. Have introduced MIL at 0.125.   Extremities are still cool with poor capillary refill so will attempt to increase milrinone as tolerated.   Attempt diuresis to decongest RV.  CRITICAL CARE Performed by: Kipp Brood   Total critical care time: 40 minutes  Critical care time was exclusive of separately billable procedures and treating other patients.  Critical care was necessary to treat or prevent imminent or life-threatening deterioration.  Critical care was time spent personally by me on the following activities: development of treatment plan with patient and/or surrogate as well as nursing, discussions with consultants, evaluation of patient's response to treatment, examination of patient, obtaining history from patient or surrogate, ordering and performing treatments and interventions, ordering and review of laboratory studies, ordering and review of radiographic studies, pulse oximetry, re-evaluation of patient's condition and participation in multidisciplinary rounds.  Kipp Brood, MD [facility] ICU Physician [place]  Pager: [phone] Mobile: [phone] After hours: [phone].

## 2019-07-17 NOTE — Progress Notes (Signed)
Patient extubated per MD order. MD and NP at bedside. Was positive for cuff leak. Able to vocalize and clear secretions initially. Patient suddenly began to become unresponsive. SAT dropped and began to bag. Re-intubated by NP. Placed on previous vent settings. RT to monitor.

## 2019-07-17 NOTE — Progress Notes (Signed)
RN received verbal order from CCM MD to give amiodarone bolus, 150mg  before beginning the gtt

## 2019-07-17 NOTE — Plan of Care (Signed)
  Problem: Clinical Measurements: Goal: Respiratory complications will improve Outcome: Not Progressing   

## 2019-07-17 NOTE — Procedures (Signed)
Intubation Procedure Note Lucas Price [id number] Nov 27, 1940  Procedure: Intubation Indications: Respiratory insufficiency  Procedure Details Consent: Unable to obtain consent because of emergent medical necessity. Time Out: Verified patient identification, verified procedure, site/side was marked, verified correct patient position, special equipment/implants available, medications/allergies/relevent history reviewed, required imaging and test results available.  Performed  Maximum sterile technique was used including antiseptics, cap, gloves, hand hygiene and mask.  MAC and 3    Evaluation Hemodynamic Status: Transient hypotension treated with pressors; O2 sats: stable throughout Patient's Current Condition: stable Complications: No apparent complications Patient did tolerate procedure well. Chest X-ray ordered to verify placement.  CXR: pending.   Clementeen Graham 07/17/2019  Erick Colace ACNP-BC [place] Pager # [phone] OR # [phone] if no answer

## 2019-07-17 NOTE — Plan of Care (Signed)
  Problem: Clinical Measurements: Goal: Diagnostic test results will improve Outcome: Progressing   

## 2019-07-17 NOTE — Progress Notes (Signed)
CPT not done at this time due to current hemodynamic status. Will try again later

## 2019-07-17 NOTE — Progress Notes (Signed)
SLP DISCHARGE Note  Patient Details Name: EDGEL DEGNAN MRN: [id number] DOB: [birth year]   Cancelled treatment:       Reason Eval/Treat Not Completed: Medical issues which prohibited therapy The patient was extubated this morning around 938am.  Unfortunately he has been re-intubated.  Given this ST will sign off.  Please re-consult when appropriate.  Thank you for the consult.   Shelly Flatten, MA, [place] [facility] SLP [phone]  Lamar Sprinkles 07/17/2019, 12:24 PM

## 2019-07-17 NOTE — Progress Notes (Signed)
Pharmacy Antibiotic Note  Lucas Price is a 78 y.o. male admitted on 07/20/2019 s/p fall.  Pharmacy has been consulted for vancomycin dosing. Pt is afebrile but WBC is elevated at 17.4. Scr is elevated at 2.14  Plan: Vancomycin 1500mg  IV x 1 then 1gm IV Q48H F/u renal fxn, C&S, clinical status and peak/trough at SS  Height: 5\' 3"  (160 cm) Weight: 173 lb 1 oz (78.5 kg) IBW/kg (Calculated) : 56.9  Temp (24hrs), Avg:98.1 F (36.7 C), Min:97.6 F (36.4 C), Max:98.4 F (36.9 C)  Recent Labs  Lab 07/14/19 0427 07/14/19 0835 07/29/2019 0416 08/11/2019 2000 07/16/19 0608 07/17/19 0511  WBC 13.7*  --  12.6* 20.7* 13.6* 17.4*  CREATININE 2.00* 2.00* 1.93* 2.10* 2.08* 2.14*  LATICACIDVEN  --  1.6  --   --   --   --     Estimated Creatinine Clearance: 27.2 mL/min (A) (by C-G formula based on SCr of 2.14 mg/dL (H)).    No Known Allergies  Antimicrobials this admission: Vanc 12/6>> CTX 12/2 >>(12/8) Otho Bellows 12/3 >> (12/8)  Dose adjustments this admission: N/A  Microbiology results: 12/1 covid - negative 12/2 MRSA PCR - negative  Thank you for allowing pharmacy to be a part of this patient's care.  Catia Todorov, Rande Lawman 07/17/2019 7:11 PM

## 2019-07-18 ENCOUNTER — Encounter (HOSPITAL_COMMUNITY): Payer: Self-pay | Admitting: Neurosurgery

## 2019-07-18 ENCOUNTER — Inpatient Hospital Stay (HOSPITAL_COMMUNITY): Payer: Medicare Other

## 2019-07-18 DIAGNOSIS — R570 Cardiogenic shock: Secondary | ICD-10-CM

## 2019-07-18 DIAGNOSIS — J9601 Acute respiratory failure with hypoxia: Secondary | ICD-10-CM | POA: Diagnosis not present

## 2019-07-18 LAB — GLUCOSE, CAPILLARY
Glucose-Capillary: 293 mg/dL — ABNORMAL HIGH (ref 70–99)
Glucose-Capillary: 279 mg/dL — ABNORMAL HIGH (ref 70–99)
Glucose-Capillary: 316 mg/dL — ABNORMAL HIGH (ref 70–99)
Glucose-Capillary: 243 mg/dL — ABNORMAL HIGH (ref 70–99)
Glucose-Capillary: 271 mg/dL — ABNORMAL HIGH (ref 70–99)
Glucose-Capillary: 263 mg/dL — ABNORMAL HIGH (ref 70–99)

## 2019-07-18 LAB — MAGNESIUM
Magnesium: 2.2 mg/dL (ref 1.7–2.4)
Magnesium: 2.2 mg/dL (ref 1.7–2.4)

## 2019-07-18 LAB — PHOSPHORUS
Phosphorus: 4.1 mg/dL (ref 2.5–4.6)
Phosphorus: 4.2 mg/dL (ref 2.5–4.6)

## 2019-07-18 LAB — POCT I-STAT 7, (LYTES, BLD GAS, ICA,H+H)
Acid-base deficit: 15 mmol/L — ABNORMAL HIGH (ref 0.0–2.0)
Acid-base deficit: 9 mmol/L — ABNORMAL HIGH (ref 0.0–2.0)
Acid-base deficit: 9 mmol/L — ABNORMAL HIGH (ref 0.0–2.0)
Bicarbonate: 13.6 mmol/L — ABNORMAL LOW (ref 20.0–28.0)
Bicarbonate: 18 mmol/L — ABNORMAL LOW (ref 20.0–28.0)
Bicarbonate: 18.3 mmol/L — ABNORMAL LOW (ref 20.0–28.0)
Calcium, Ion: 1.16 mmol/L (ref 1.15–1.40)
Calcium, Ion: 1.13 mmol/L — ABNORMAL LOW (ref 1.15–1.40)
Calcium, Ion: 1.16 mmol/L (ref 1.15–1.40)
HCT: 24 % — ABNORMAL LOW (ref 39.0–52.0)
HCT: 30 % — ABNORMAL LOW (ref 39.0–52.0)
HCT: 27 % — ABNORMAL LOW (ref 39.0–52.0)
Hemoglobin: 8.2 g/dL — ABNORMAL LOW (ref 13.0–17.0)
Hemoglobin: 10.2 g/dL — ABNORMAL LOW (ref 13.0–17.0)
Hemoglobin: 9.2 g/dL — ABNORMAL LOW (ref 13.0–17.0)
O2 Saturation: 93 %
O2 Saturation: 90 %
O2 Saturation: 93 %
Patient temperature: 35.1
Patient temperature: 98.6
Patient temperature: 97.5
Potassium: 5.5 mmol/L — ABNORMAL HIGH (ref 3.5–5.1)
Potassium: 4.3 mmol/L (ref 3.5–5.1)
Potassium: 4.3 mmol/L (ref 3.5–5.1)
Sodium: 142 mmol/L (ref 135–145)
Sodium: 148 mmol/L — ABNORMAL HIGH (ref 135–145)
Sodium: 147 mmol/L — ABNORMAL HIGH (ref 135–145)
TCO2: 15 mmol/L — ABNORMAL LOW (ref 22–32)
TCO2: 19 mmol/L — ABNORMAL LOW (ref 22–32)
TCO2: 20 mmol/L — ABNORMAL LOW (ref 22–32)
pCO2 arterial: 37.2 mmHg (ref 32.0–48.0)
pCO2 arterial: 40 mmHg (ref 32.0–48.0)
pCO2 arterial: 44.6 mmHg (ref 32.0–48.0)
pH, Arterial: 7.158 — CL (ref 7.350–7.450)
pH, Arterial: 7.261 — ABNORMAL LOW (ref 7.350–7.450)
pH, Arterial: 7.218 — ABNORMAL LOW (ref 7.350–7.450)
pO2, Arterial: 78 mmHg — ABNORMAL LOW (ref 83.0–108.0)
pO2, Arterial: 67 mmHg — ABNORMAL LOW (ref 83.0–108.0)
pO2, Arterial: 79 mmHg — ABNORMAL LOW (ref 83.0–108.0)

## 2019-07-18 LAB — COMPREHENSIVE METABOLIC PANEL
ALT: 161 U/L — ABNORMAL HIGH (ref 0–44)
AST: 80 U/L — ABNORMAL HIGH (ref 15–41)
Albumin: 2.2 g/dL — ABNORMAL LOW (ref 3.5–5.0)
Alkaline Phosphatase: 64 U/L (ref 38–126)
Anion gap: 10 (ref 5–15)
BUN: 81 mg/dL — ABNORMAL HIGH (ref 8–23)
CO2: 19 mmol/L — ABNORMAL LOW (ref 22–32)
Calcium: 7.6 mg/dL — ABNORMAL LOW (ref 8.9–10.3)
Chloride: 119 mmol/L — ABNORMAL HIGH (ref 98–111)
Creatinine, Ser: 2.72 mg/dL — ABNORMAL HIGH (ref 0.61–1.24)
GFR calc Af Amer: 25 mL/min — ABNORMAL LOW (ref >60)
GFR calc non Af Amer: 22 mL/min — ABNORMAL LOW (ref >60)
Glucose, Bld: 358 mg/dL — ABNORMAL HIGH (ref 70–99)
Potassium: 4.6 mmol/L (ref 3.5–5.1)
Sodium: 148 mmol/L — ABNORMAL HIGH (ref 135–145)
Total Bilirubin: 0.7 mg/dL (ref 0.3–1.2)
Total Protein: 5 g/dL — ABNORMAL LOW (ref 6.5–8.1)

## 2019-07-18 LAB — CBC
HCT: 33.3 % — ABNORMAL LOW (ref 39.0–52.0)
Hemoglobin: 10.3 g/dL — ABNORMAL LOW (ref 13.0–17.0)
MCH: 32 pg (ref 26.0–34.0)
MCHC: 30.9 g/dL (ref 30.0–36.0)
MCV: 103.4 fL — ABNORMAL HIGH (ref 80.0–100.0)
Platelets: 121 10*3/uL — ABNORMAL LOW (ref 150–400)
RBC: 3.22 MIL/uL — ABNORMAL LOW (ref 4.22–5.81)
RDW: 17.1 % — ABNORMAL HIGH (ref 11.5–15.5)
WBC: 29 10*3/uL — ABNORMAL HIGH (ref 4.0–10.5)
nRBC: 5.7 % — ABNORMAL HIGH (ref 0.0–0.2)

## 2019-07-18 LAB — URINALYSIS, COMPLETE (UACMP) WITH MICROSCOPIC
Bilirubin Urine: NEGATIVE
Glucose, UA: 50 mg/dL — AB
Ketones, ur: NEGATIVE mg/dL
Nitrite: NEGATIVE
Protein, ur: 30 mg/dL — AB
RBC / HPF: >50 RBC/hpf — ABNORMAL HIGH (ref 0–5)
Specific Gravity, Urine: 1.016 (ref 1.005–1.030)
WBC, UA: >50 WBC/hpf — ABNORMAL HIGH (ref 0–5)
pH: 5 (ref 5.0–8.0)

## 2019-07-18 LAB — COOXEMETRY PANEL
Carboxyhemoglobin: 0.9 % (ref 0.5–1.5)
Carboxyhemoglobin: 1.2 % (ref 0.5–1.5)
Methemoglobin: 0.8 % (ref 0.0–1.5)
Methemoglobin: 1.2 % (ref 0.0–1.5)
O2 Saturation: 95.9 %
O2 Saturation: 51.5 %
Total hemoglobin: 10.1 g/dL — ABNORMAL LOW (ref 12.0–16.0)
Total hemoglobin: 10.6 g/dL — ABNORMAL LOW (ref 12.0–16.0)

## 2019-07-18 LAB — ECHOCARDIOGRAM COMPLETE
Height: 63 in
Weight: 3185.21 oz

## 2019-07-18 MED ORDER — VASOPRESSIN 20 UNIT/ML IV SOLN
0.0300 [IU]/min | INTRAVENOUS | Status: DC
  Administered 2019-07-18 – 2019-07-25 (×8): 0.03 [IU]/min via INTRAVENOUS
  Filled 2019-07-18 (×11): qty 2

## 2019-07-18 MED ORDER — INSULIN DETEMIR 100 UNIT/ML ~~LOC~~ SOLN
10.0000 [IU] | Freq: Two times a day (BID) | SUBCUTANEOUS | Status: DC
  Administered 2019-07-18 – 2019-07-19 (×2): 10 [IU] via SUBCUTANEOUS
  Filled 2019-07-18 (×5): qty 0.1

## 2019-07-18 MED ORDER — PANTOPRAZOLE SODIUM 40 MG PO PACK
40.0000 mg | PACK | Freq: Every day | ORAL | Status: DC
  Administered 2019-07-19 – 2019-07-25 (×7): 40 mg
  Filled 2019-07-18 (×8): qty 20

## 2019-07-18 MED ORDER — SODIUM BICARBONATE-DEXTROSE 150-5 MEQ/L-% IV SOLN
150.0000 meq | INTRAVENOUS | Status: DC
  Administered 2019-07-18: 150 meq via INTRAVENOUS
  Filled 2019-07-18: qty 1000

## 2019-07-18 MED ORDER — SODIUM CHLORIDE 0.9 % IV SOLN
2.0000 g | INTRAVENOUS | Status: DC
  Filled 2019-07-18: qty 2

## 2019-07-18 MED ORDER — PIPERACILLIN-TAZOBACTAM 3.375 G IVPB
3.3750 g | Freq: Three times a day (TID) | INTRAVENOUS | Status: DC
  Administered 2019-07-18 – 2019-07-19 (×2): 3.375 g via INTRAVENOUS
  Filled 2019-07-18 (×2): qty 50

## 2019-07-18 MED ORDER — INSULIN ASPART 100 UNIT/ML ~~LOC~~ SOLN
0.0000 [IU] | SUBCUTANEOUS | Status: DC
  Administered 2019-07-18: 11 [IU] via SUBCUTANEOUS
  Administered 2019-07-18: 15 [IU] via SUBCUTANEOUS
  Administered 2019-07-18: 7 [IU] via SUBCUTANEOUS
  Administered 2019-07-18 – 2019-07-19 (×2): 11 [IU] via SUBCUTANEOUS
  Administered 2019-07-19: 7 [IU] via SUBCUTANEOUS
  Administered 2019-07-19: 17:00:00 4 [IU] via SUBCUTANEOUS
  Administered 2019-07-19: 7 [IU] via SUBCUTANEOUS
  Administered 2019-07-19: 11 [IU] via SUBCUTANEOUS
  Administered 2019-07-19: 08:00:00 7 [IU] via SUBCUTANEOUS
  Administered 2019-07-20 (×5): 4 [IU] via SUBCUTANEOUS
  Administered 2019-07-20: 7 [IU] via SUBCUTANEOUS
  Administered 2019-07-21: 4 [IU] via SUBCUTANEOUS
  Administered 2019-07-21 (×2): 3 [IU] via SUBCUTANEOUS
  Administered 2019-07-21: 4 [IU] via SUBCUTANEOUS
  Administered 2019-07-21: 3 [IU] via SUBCUTANEOUS
  Administered 2019-07-21 – 2019-07-22 (×4): 4 [IU] via SUBCUTANEOUS
  Administered 2019-07-22: 3 [IU] via SUBCUTANEOUS
  Administered 2019-07-23 – 2019-07-24 (×7): 4 [IU] via SUBCUTANEOUS

## 2019-07-18 MED ORDER — MIDAZOLAM HCL 2 MG/2ML IJ SOLN: 1.0000 mg | Freq: Once | INTRAMUSCULAR | Status: DC

## 2019-07-18 MED ORDER — PERFLUTREN LIPID MICROSPHERE
1.0000 mL | INTRAVENOUS | Status: AC | PRN
  Administered 2019-07-18: 5 mL via INTRAVENOUS
  Filled 2019-07-18: qty 10

## 2019-07-18 MED ORDER — FENTANYL CITRATE (PF) 100 MCG/2ML IJ SOLN
INTRAMUSCULAR | Status: AC
  Filled 2019-07-18: qty 2

## 2019-07-18 MED ORDER — POLYVINYL ALCOHOL 1.4 % OP SOLN
1.0000 [drp] | OPHTHALMIC | Status: DC
  Administered 2019-07-19 – 2019-07-25 (×41): 1 [drp] via OPHTHALMIC
  Filled 2019-07-18: qty 15

## 2019-07-18 MED ORDER — VITAL AF 1.2 CAL PO LIQD
1000.0000 mL | ORAL | Status: DC
  Administered 2019-07-18 – 2019-07-21 (×4): 1000 mL

## 2019-07-18 MED ORDER — PANTOPRAZOLE SODIUM 40 MG IV SOLR
40.0000 mg | Freq: Every day | INTRAVENOUS | Status: DC
  Administered 2019-07-18: 40 mg via INTRAVENOUS
  Filled 2019-07-18: qty 40

## 2019-07-18 MED ORDER — INSULIN ASPART 100 UNIT/ML ~~LOC~~ SOLN
0.0000 [IU] | SUBCUTANEOUS | Status: DC
  Administered 2019-07-18: 8 [IU] via SUBCUTANEOUS

## 2019-07-18 MED ORDER — SODIUM CHLORIDE 0.9% FLUSH
10.0000 mL | Freq: Two times a day (BID) | INTRAVENOUS | Status: DC
  Administered 2019-07-18 (×2): 10 mL
  Administered 2019-07-18: 20 mL
  Administered 2019-07-19 – 2019-07-25 (×8): 10 mL

## 2019-07-18 MED ORDER — SODIUM CHLORIDE 0.9% FLUSH: 10.0000 mL | INTRAVENOUS | Status: DC | PRN

## 2019-07-18 MED ORDER — EPINEPHRINE HCL 5 MG/250ML IV SOLN IN NS
0.0000 ug/min | INTRAVENOUS | Status: DC
  Administered 2019-07-18: 2 ug/min via INTRAVENOUS
  Filled 2019-07-18: qty 250

## 2019-07-18 MED ORDER — MIDAZOLAM HCL 2 MG/2ML IJ SOLN
INTRAMUSCULAR | Status: AC
  Filled 2019-07-18: qty 2

## 2019-07-18 MED ORDER — INSULIN ASPART 100 UNIT/ML ~~LOC~~ SOLN
3.0000 [IU] | SUBCUTANEOUS | Status: DC
  Administered 2019-07-18 – 2019-07-19 (×6): 3 [IU] via SUBCUTANEOUS

## 2019-07-18 MED FILL — Gelatin Absorbable MT Powder: OROMUCOSAL | Qty: 1 | Status: AC

## 2019-07-18 MED FILL — Thrombin For Soln 5000 Unit: CUTANEOUS | Qty: 5000 | Status: AC

## 2019-07-18 NOTE — Progress Notes (Signed)
RT did lung recruitment on patient per Oletta Darter, MD. Patient tolerated well. RT will monitor as needed.

## 2019-07-18 NOTE — Progress Notes (Signed)
[place] Progress Note Patient Name: Lucas Price DOB: 11/11/1940 MRN: [id number]   Date of Service  07/18/2019  HPI/Events of Note  Notified of need for stress ulcer prophylaxis - Patient intubated and ventilated.   eICU Interventions  Will order: 1. Protonix IV.      Intervention Category Intermediate Interventions: Best-practice therapies (e.g. DVT, beta blocker, etc.)  Sommer,Steven Eugene 07/18/2019, 5:56 AM

## 2019-07-18 NOTE — Progress Notes (Addendum)
   [place]wan numbers  On NE 50  CVP 14 PA 61/30 (44) PCWP 12 SVR 877 PVR 8.0 WU Thermo CO/CI = 4.0/2.1  [place]wan numbers c/w mixed picture of RV failure and neurogenic shock (versus sepsis) with low SVR despite high-dose NE. Will add Vasopressin +/- midrodine in an effort to try to wean NE. Once BP stabilized can add sildeanfil or iNO. Check cultures. Low threshold to add abx if spikes.  Glori Bickers, MD  7:30 PM

## 2019-07-18 NOTE — Progress Notes (Addendum)
OT Cancellation and Discharge  Note  Patient Details Name: Lucas Price MRN: [id number] DOB: 25-Jun-1941   Cancelled Treatment:    Reason Eval/Treat Not Completed: Patient not medically ready.  Pt intubated and on pressors  With neurogenic shock and possibly cardiogenic shock.  OT will sign off at this time.  Lucille Passy, OTR/L [place] Pager [phone] Office [phone]   Lucille Passy M 07/18/2019, 6:19 AM

## 2019-07-18 NOTE — Progress Notes (Signed)
Pharmacy Antibiotic Note  Lucas Price is a 78 y.o. male admitted on 07/21/2019 s/p fall.  Pharmacy had been consulted for empiric vancomycin dosing 12/6 patient now febrile 101 WBC 29 - on HCT for stress dose steroids in setting of hypotension - neurogenic/spetic shock Cr elevated 2.7  Will add zosyn to cover GNR sepsis with low end SVR 800 and Hx UTIs and Cxr bibasilar airspace dz atelectasis v infiltrate  Plan: Check Blood Cx x2 Continue Vancomycin  1gm IV Q48H F/u renal fxn, C&S, clinical status and peak/trough at SS Zosyn 3.375 Gm 1v q8h EI  Height: 5\' 3"  (160 cm) Weight: 199 lb 1.2 oz (90.3 kg) IBW/kg (Calculated) : 56.9  Temp (24hrs), Avg:98.2 F (36.8 C), Min:97.5 F (36.4 C), Max:99.2 F (37.3 C)  Recent Labs  Lab 07/14/19 0835 07/31/2019 0416 07/14/2019 2000 07/16/19 0608 07/17/19 0511 07/17/19 2010 07/17/19 2309 07/18/19 0456  WBC  --  12.6* 20.7* 13.6* 17.4*  --   --  29.0*  CREATININE 2.00* 1.93* 2.10* 2.08* 2.14*  --   --  2.72*  LATICACIDVEN 1.6  --   --   --   --  1.7 1.9  --     Estimated Creatinine Clearance: 23 mL/min (A) (by C-G formula based on SCr of 2.72 mg/dL (H)).    No Known Allergies  Antimicrobials this admission: Zosyn 12/7 Vanc 12/6>> CTX 12/2 >>(12/8) Otho Bellows 12/3 >> (12/8)  Dose adjustments this admission: N/A  Microbiology results: 12/7 Bcx 12/1 covid - negative 12/2 MRSA PCR - negative  Bonnita Nasuti Pharm.D. CPP, BCPS Clinical Pharmacist [phone] 07/18/2019 8:23 PM

## 2019-07-18 NOTE — Progress Notes (Addendum)
  NEUROSURGERY PROGRESS NOTE   Remains intubated Neurogenic shock with possible cardiogenic shock as well  EXAM:  BP (!) 121/59   Pulse (!) 106   Temp (!) 97.5 F (36.4 C) (Axillary)   Resp (!) 25   Ht 5\' 3"  (1.6 m)   Wt 90.3 kg   SpO2 100%   BMI 35.26 kg/m   Awake Follows commands Wiggles toes L>R Grips bilaterally, L>R Incision: c/d/i  IMPRESSION/PLAN 78 y.o. male  POD #5 s/p C5-T1 ACDF for central cord syndrome, POD #3 T10-L3 fusion for unstable T12-L1 chance type fracture. Remains intubated. Neurogenic and possible cardiogenic shock, on pressors. Follow commands - No new NS recs. - Continue care per primary team

## 2019-07-18 NOTE — Plan of Care (Signed)
Discussed patient with Dr. Haroldine Laws following his evaluation; recommending transfer to CVICU and subsequent placement of swan ganz.   -Transfer order placed to [place] -Patient will remain on PCCM service  -Appreciate heart failure assistance     Eliseo Gum MSN, AGACNP-BC [place] [phone] If no answer, [phone] 07/18/2019, 2:11 PM

## 2019-07-18 NOTE — Progress Notes (Signed)
Initial Nutrition Assessment  DOCUMENTATION CODES:   Not applicable  INTERVENTION:   Initiate Vital AF 1.2 @ 50 ml/hr (1200 ml/day) via OG tube 30 ml Prostat BID  Provides: 1640 kcal, 120 grams protein, 134 grams CHO, and 973 ml free water.   D/C Vital High Protein   NUTRITION DIAGNOSIS:   Increased nutrient needs related to post-op healing as evidenced by estimated needs.  GOAL:   Patient will meet greater than or equal to 90% of their needs  MONITOR:   Vent status, TF tolerance, I & O's  REASON FOR ASSESSMENT:   Consult, Ventilator Enteral/tube feeding initiation and management  ASSESSMENT:   Pt with PMH of COPD, CKD, DM, GERD, HTN, ischemic heart dz who fell while climbing stairs admitted 12/1 with central cord syndrome s/p C5-T1 ACDF and T10-L3 fusion. Pt with hypotension and remains intubated after surgery.   Pt discussed during ICU rounds and with RN.  Spoke with son at bedside. He reports pt lives with wife and son. He walks around his house for exercise but cannot for long as he becomes SOB. He uses BiPAP at night. Son reports pt had mouth ulcers a few months ago but believes these are better. RN in room and did not see any during mouth care.  Per RN pt's MAP has been stable on current pressor support.  Noted elevated blood sugars, insulin adjusted.   Patient is currently intubated on ventilator support MV: 11.7 L/min Temp (24hrs), Avg:98.1 F (36.7 C), Min:97.5 F (36.4 C), Max:98.5 F (36.9 C)  Medications reviewed and include: os-cal, vitamin D3, colace, solu-cortef, SSI, 10 units Levemir, senokot Levophed @ 40 mcg 100 ml free water every 8 hours = 300 ml Labs reviewed: Na 148 (H), Cr 2.72 (H) CBG's: [phone] UOP: 500 ml  I&O: 9.7 L positive   MAP: 49 (49-62) 16 F OG, tip in stomach    TF: Vital High Protein @ 40 ml/hr with 30 ml Prostat BID via OG tube started 12/6  Provides: 1160 kcal and 114 grams protein  NUTRITION - FOCUSED PHYSICAL  EXAM:    Most Recent Value  Orbital Region  No depletion  Upper Arm Region  No depletion  Thoracic and Lumbar Region  No depletion  Buccal Region  Unable to assess  Temple Region  No depletion  Clavicle Bone Region  No depletion  Clavicle and Acromion Bone Region  No depletion  Scapular Bone Region  No depletion  Dorsal Hand  Unable to assess  Patellar Region  No depletion  Anterior Thigh Region  No depletion  Posterior Calf Region  No depletion  Edema (RD Assessment)  Moderate  Hair  Reviewed  Eyes  Reviewed  Mouth  Unable to assess  Skin  Reviewed  Nails  Reviewed       Diet Order:   Diet Order    None      EDUCATION NEEDS:   No education needs have been identified at this time  Skin:  Skin Assessment: Reviewed RN Assessment(incisions)  Last BM:  12/1  Height:   Ht Readings from Last 1 Encounters:  07/25/2019 5\' 3"  (1.6 m)    Weight:   Wt Readings from Last 1 Encounters:  07/18/19 90.3 kg    Ideal Body Weight:     BMI:  Body mass index is 35.26 kg/m.  Estimated Nutritional Needs:   Kcal:  1660  Protein:  110-125 grams  Fluid:  > 1.6 L/day  [place], [place], CNSC [phone]  Pager [phone] After Hours Pager

## 2019-07-18 NOTE — TOC Initial Note (Signed)
Transition of Care [facility]) - Initial/Assessment Note    Patient Details  Name: Lucas Price MRN: [id number] Date of Birth: [birth year]  [facility]) CM/SW Contact:    Carles Collet, RN Phone Number: 07/18/2019, 9:48 AM  Clinical Narrative:           Patient admitted from home lives  w wife. 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  Remains intubated. Neurogenic shock with possible cardiogenic shock as well. PT OT consult once patient stabilizes.  PCP- [facility] [place]          Expected Discharge Plan: (TBD) Barriers to Discharge: Continued Medical Work up   Patient Goals and CMS Choice        Expected Discharge Plan and Services Expected Discharge Plan: (TBD)       Living arrangements for the past 2 months: Single Family Home                                      Prior Living Arrangements/Services Living arrangements for the past 2 months: Single Family Home Lives with:: Spouse                   Activities of Daily Living   ADL Screening (condition at time of admission) Patient's cognitive ability adequate to safely complete daily activities?: Yes Is the patient deaf or have difficulty hearing?: No Does the patient have difficulty seeing, even when wearing glasses/contacts?: No Does the patient have difficulty concentrating, remembering, or making decisions?: No Patient able to express need for assistance with ADLs?: Yes Does the patient have difficulty dressing or bathing?: Yes Independently performs ADLs?: No Communication: Independent Dressing (OT): Dependent Is this a change from baseline?: Change from baseline, expected to last >3 days Grooming: Dependent Is this a change from baseline?: Change from baseline, expected to last >3 days Feeding: Dependent Is this a change from baseline?: Change from baseline, expected to last >3 days Bathing: Dependent Is this a change from  baseline?: Change from baseline, expected to last >3 days Toileting: Dependent Is this a change from baseline?: Change from baseline, expected to last >3days In/Out Bed: Dependent Is this a change from baseline?: Change from baseline, expected to last >3 days Walks in Home: Dependent Is this a change from baseline?: Change from baseline, expected to last >3 days Does the patient have difficulty walking or climbing stairs?: Yes Weakness of Legs: Both Weakness of Arms/Hands: Both  Permission Sought/Granted                  Emotional Assessment              Admission diagnosis:  Surgery, elective [Z41.9] Fall [W19.XXXA] Fall, initial encounter [id number]] 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 Atelectasis   . Acute respiratory failure with hypoxia ([place])   . Central cord syndrome ([place]) 08/10/2019  . Traumatic fractures of T12 and L1 vertebrae ([place]) 08/07/2019  . CAD (coronary artery disease) 07/24/2019  . 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 Status post cervical spinal fusion 07/14/2019  . 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 Ischemic heart disease 03/03/2019  . Coronary artery calcification seen on CAT scan 03/03/2019  . Morbid obesity ([place]) 06/06/2018  . 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  Rhinitis, chronic 01/12/2018  . 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 Chronic respiratory failure with hypoxia ([place]), on O2 at home, followed by Pulmonology 11/05/2017  . 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 Abnormal finding on GI tract imaging   . Benign neoplasm of ascending colon   . Pulmonary nodules/lesions, multiple 09/08/2017  . 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 Renal calculus 03/19/2015  . Anemia in chronic kidney disease (CKD) 05/26/2014  . CKD (chronic kidney disease) stage 3, GFR 30-59 ml/min ([place]), followed by Renal 05/26/2014  . COPD (chronic  obstructive pulmonary disease) ([place]) 05/26/2014  . Insulin dependent diabetes with renal manifestation ([place]), on glimepiride, Lantus, Januvia 05/26/2014  . Former smoker 05/26/2014  . GERD (gastroesophageal reflux disease) 05/26/2014  . 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 Bladder neck obstruction 04/24/2014  . Recurrent urinary tract infection 09/05/2013  . Retention of urine 09/05/2013   PCP:  No primary care provider on file. Pharmacy:   [facility], [address] [place] [address] Phone: [phone] Fax: [phone]  [place], [address] [place] [address] Phone: [phone] Fax: [phone]     Social Determinants of Health (SDOH) Interventions    Readmission Risk Interventions No flowsheet data found.

## 2019-07-18 NOTE — Progress Notes (Addendum)
NAME:  Lucas Price, MRN:  [id number], DOB:  [birth year], LOS: 5 ADMISSION DATE:  08/07/2019, CONSULTATION DATE:  12/3 REFERRING MD:  Dr. Christella Noa, CHIEF COMPLAINT:  Hypotension   Brief History   78 year old male admitted 12/1 after fall and found to have central cord syndrome with stenosis C5-T1. Taken urgently for decompression 12/2. In the early AM hours he became hypotensive and PCCM was consulted.   Past Medical History   has a past medical history of Anemia (05/26/2014), Bladder neck obstruction (04/24/2014), Chronic respiratory failure with hypoxia ([place]), CKD (chronic kidney disease) (05/26/2014), COPD (chronic obstructive pulmonary disease) ([place]) (05/26/2014), Coronary artery calcification seen on CAT scan, Diabetes mellitus ([place]) (05/26/2014), Former smoker (05/26/2014), GERD (gastroesophageal reflux disease) (05/26/2014), Glaucoma, Heart murmur, HTN (hypertension) (05/26/2014), Ischemic heart disease, Lung nodules, Open-angle glaucoma (11/23/2017), Prune belly syndrome (05/26/2014), Recurrent urinary tract infection (09/05/2013), Renal calculus (03/19/2015), Retention of urine (09/05/2013), Sleep apnea, and SOB (shortness of breath).  Significant Hospital Events   12/1 fall while climbing stairs, present to ED 12/2 found to have central cord syndrome, taken to OR for decompression.  12/3 Hypotensive, PCCM consult.  12/4 more hypoxic. Requiring HFO2 at 15 liters.  went back to OR  for Lumbar 3 fusion of unstable lumbar fracture. Returned to ICU on vent  12/5 weaning but lower Vts, upper extremity strength seems improved.  12/6: He remains on low-dose Neo-Synephrine, but also on fentanyl infusion.  Looks comfortable on spontaneous breathing trial and has acceptable frequency/tidal volume ratio for extubation so order has been written to extubate. Was extubated. Was initially awake. Alert. Following commands and in no distress. Then became suddenly unresponsive, acutely hypotensive and began to  brady down. Was emergently re-intubated. Working dx at this point raising concern for sig cardiac component  12/7: Remains FiO2 100%, PEEP 10. Awaiting ECHO. Max on NE Consults:  Cardiology NSG PCCM  Procedures:  12/3: ACDF C5-T1 12/6 ETT>>>  Significant Diagnostic Tests:  CT head Cspine 12/1 > Mild posterior vertex scalp soft tissue injury without underlying fracture. No acute traumatic injury to the brain identified. Suggestion of disproportionate biparietal cerebral volume loss, non-specific but can be seen with dementia.  CT chest 12/2 > new GGO in [place] and throughout R lung. Unchanged nodules from 2019. New age-indeterminate widening of the anterior interdisc space at T12-L1 which is partially visualized and concerning for fracture of the anterior flowing osteophyte.  Echo 59/5> Normal LV systolic function, right ventricle mildly enlarged with normal RV systolic function, moderate pulmonary hypertension, mild mitral annular calcification and aortic valve calcification.  Mild to moderate TR.    MRI cervical spine 12/2 > Acute disruption of the anterior longitudinal ligament at C5-C6 with a acute fracture of the anterior inferior endplate of the C5 osteophyte . Significant prevertebral soft tissue edema and fluid is seen extending from C2 through C6. Marrow edema/osseous contusions involving the C5-C7 anterior vertebral bodies. Interspinous ligamentous injury at C5-C6. Findings suggestive of acute cord contusion seen at C5 with cord edema. No definite epidural hematoma or hemorrhage. 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  MRI lumbar spine 12/2 > Acute fracture through ankylosed spine at the T12-L1 disc space. Disrupted anterior longitudinal ligament, disc, and propagation of the fracture through the  posterior elements including the L1 spinous process. This is most likely an unstable injury. Associated  paraspinal muscle injury, including a small intramuscular hematoma suspected along the lateral right erector spinae fibers at L2-L3. Transitional lumbosacral anatomy, with sacralized L5 level and vestigial L5-S1 disc space. Superimposed lumbar spine degeneration: advanced lumbar disc and endplate disease with multilevel severe lateral recess stenosis, moderate L3-L4 spinal stenosis.  Micro Data:  covid 19: negative x 2  Antimicrobials:  CTX 12/1 -12/6 Azithromycin 12/1 -12/6 Vanc 12/6 QOD >>>  Interim history/subjective:  SSI increased from sensitive to moderate overnight Remains hyperglycemic Interval increase in leukocytosis   Objective   Blood pressure 112/61, pulse (!) 116, temperature 98.5 F (36.9 C), temperature source Axillary, resp. rate (!) 23, height 5\' 3"  (1.6 m), weight 90.3 kg, SpO2 100 %.    Vent Mode: PRVC FiO2 (%):  [40 %-100 %] 100 % Set Rate:  [22 bmp-24 bmp] 24 bmp Vt Set:  [450 mL] 450 mL PEEP:  [5 cmH20-10 cmH20] 10 cmH20 Pressure Support:  [5 cmH20] 5 cmH20 Plateau Pressure:  [16 cmH20-20 cmH20] 20 cmH20   Intake/Output Summary (Last 24 hours) at 07/18/2019 0714 Last data filed at 07/18/2019 0600 Gross per 24 hour  Intake 3371.36 ml  Output 500 ml  Net 2871.36 ml   Filed Weights   07/17/2019 1355 07/31/2019 1937 07/18/19 0500  Weight: 80.7 kg 78.5 kg 90.3 kg    Examination:   General: Critically ill appearing older adult M, intubated and off sedation. NAD  HEENT [place]. Scattered facial ecchymosis. ETT secure. C collar in place Pulmonary: Bibasilar crackles. Symmetrical chest expansion, unlabored appearing.  Cardiac: Tachycardic rate, regular rhythm. Systolic murmur over aortic valve. 1+ pulses. Abdomen: Soft, obese, ndnt.  GU Foley collecting clear yellow urine  Extremities: L radial A line. RUE PICC. Scattered ecchymosis BUE. BUE BLE non-pitting edema    Neuro: Awake, calm. Moving BUE spontaneously. Moves BLE very weakly. Blinking to questions.   Resolved Hospital Problem list   Hyperkalemia Hyperphos  Assessment & Plan:   Acute on chronic hypoxic respiratory failure requiring reintubation -bilateral infiltrates: likely multifactorial-- atelectasis, edema, ? Aspiration, ? infection History of COPD SARS Cov2 neg Plan Will need trach Continue vent support-- wean as able (currently PEEP 10 FiO2 100%) VAP, PAD protocols BDs Pulm hygiene  AM CXR  Leukocytosis -reactive vs steroids vs infective (patient is afebrile and this is lower ddx) -s/p 6 day course rocephin, 5 d azithromycin P Trend WBC, temperature QOD vanc per primary  If febrile, pan culture  Shock -Neurogenic shock, possible cardiogenic shock component as well  -In the setting central cord compression, dilaudid administration, poor PO intake over the last 24-48 hours, CAD -Do not favor septic shock as etiology however patient is certainly at risk for VAP  Plan MAP goal > 15 NE for goal Milrinone initiated 12/6; is not on 12/7 -- I will obtain coox and ECHO to further direct utility/use  If no utility for milrinone and further vasoactive support is needed, consider vaso  CVP qShift Goal euvolemia ECHO has been ordered 12/6 not yet performed   Afib w/ RVR Plan Continue ICU monitoring On amio gtt   Central cord syndrome at the level of the cervical spine secondary to fall.  T12-L1 fracture S/p ACDF cervical 12/2 OR for stabilization of thoracolumbar fracture 12/4 Plan Per NSGY C-collar in place  Serial neuro checks   Transaminitis, mild -likely in setting of shock P Will check CMP in AM  Goal normotensive to prevent further organ injury   AKI on CKD -Baseline creatinine 1.8 -12/7 Cr  increase from 2.14 to 2.72 Plan Strict I/O Trend lytes and renal indices Goal normotensive  Holding diuresis, allopurinol in setting of AKI  Avoid nephrotoxic  medications as able   Urinary retention Plan Keep Foley catheter in place  Hypocalcemia -ionized Ca 1.13 12/7 Hypernatremia, mild Hyperchloremia -hyperchloremic metabolic acidosis Plan Replacing Calcium (receiving calcium carbonate) AM BMP, mag, phos, ionized calcium   IDDM -acute hyperglycemia in setting of critical illness, D5, steroids  Plan Currently Basal Lantus and SSI; will change to BID levimir and rSSI  Will engage DM coordinator as well   CAD history Plan Additional recommendations per cardiology, continue statin, resume aspirin when able   Best practice:  Pain/Anxiety/Delirium protocol (if indicated): fentanyl  VAP protocol (if indicated): Yes DVT prophylaxis: SCD  GI prophylaxis: per primary Glucose control: Lantus, SSI  Mobility: BR Code Status: FULL Family Communication: Pending 12/7 Disposition: Remains in ICU  CRITICAL CARE Performed by: Cristal Generous   Total critical care time: 40 minutes  Critical care time was exclusive of separately billable procedures and treating other patients. Critical care was necessary to treat or prevent imminent or life-threatening deterioration.  Critical care was time spent personally by me on the following activities: development of treatment plan with patient and/or surrogate as well as nursing, discussions with consultants, evaluation of patient's response to treatment, examination of patient, obtaining history from patient or surrogate, ordering and performing treatments and interventions, ordering and review of laboratory studies, ordering and review of radiographic studies, pulse oximetry and re-evaluation of patient's condition.  Eliseo Gum MSN, AGACNP-BC [facility] [place] [phone] If no answer, [phone] 07/18/2019, 7:15 AM

## 2019-07-18 NOTE — Progress Notes (Signed)
PT Cancellation Note  Patient Details Name: Lucas Price MRN: [id number] DOB: November 18, 1940   Cancelled Treatment:    Reason Eval/Treat Not Completed: Patient not medically ready. Pt remains intubated, PEEP of 10, FiO2 is 100% and remains on pressors. PT ORDER cancelled due to medical instability and high O2 demands. Please re-consult if/when appropriate to progress mobility.  Kittie Plater, PT, DPT Acute Rehabilitation Services Pager #: [phone] Office #: [phone]    Berline Lopes 07/18/2019, 8:39 AM

## 2019-07-18 NOTE — CV Procedure (Signed)
Procedure Note:  Procedure: PA catheter placement  Indication: Shock   The risks and indication of the procedure were explained to family. Consent was signed and placed on the chart. An appropriate timeout was taken prior to the procedure. The right neck was prepped and draped in the routine sterile fashion and anesthetized with 1% local lidocaine.   A 7 FR venous sheath was placed in the right internal jugular vein using a modified Seldinger technique and u/s guidance. A standard Swan-Ganz catheter was used for the procedure. The tip of the PA cath was maneuvered into the pulmonary artery using pressure waveform guidance. The catheter was sutured into place. No immediate complications apparent. CXR pending.  Glori Bickers, MD  6:31 PM

## 2019-07-18 NOTE — Progress Notes (Signed)
[place] Progress Note Patient Name: [name] DOB: 05-20-1941 MRN: [id number]   Date of Service  07/18/2019  HPI/Events of Note  Hypoxia - 100%/P 8 with sat = 99%.  eICU Interventions  Will order: 1. Increase PEEP to 10 cm H2O. 2. Recruitment maneuver now.  3. Will do portable CXR ordered for 5 AM now.      Intervention Category Major Interventions: Hypoxemia - evaluation and management  Sommer,Steven Eugene 07/18/2019, 3:57 AM

## 2019-07-18 NOTE — Progress Notes (Signed)
Patient transported from 4N15 to 7N34 without complications. RN at bedside.

## 2019-07-18 NOTE — Progress Notes (Signed)
[place] Progress Note Patient Name: Lucas Price DOB: [birth year] MRN: [id number]   Date of Service  07/18/2019  HPI/Events of Note  Hyperglycemia - Blood glucose = 238 --> 298.  eICU Interventions  Will order: 1. Change to Q 4 hour moderate Novolog SSI.      Intervention Category Major Interventions: Hyperglycemia - active titration of insulin therapy  Sommer,Steven Eugene 07/18/2019, 4:30 AM

## 2019-07-18 NOTE — Progress Notes (Signed)
  Echocardiogram 2D Echocardiogram has been performed with Definity.  Lucas Price 07/18/2019, 5:09 PM

## 2019-07-18 NOTE — Consult Note (Addendum)
Advanced Heart Failure Team Consult Note   Primary Physician: No primary care provider on file. PCP-Cardiologist:  No primary care provider on file.  Reason for Consultation: Cardiogenic Shock   HPI:    Lucas Price is seen today for evaluation of cardiogenic shock  at the request of Dr Kipp Brood.   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   Admitted after fall and cervical neck fracture and had lower extremity weakness.  Neurosurgery consulted. 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 He was taken to the OR on 12/2 and had discectomy C5-6, C6-7, C7-T1, plate screws Z6-X0.  Post course has been complicated by  Hypotension.  CCM consulted for post op hypotension and respiratory failure. Placed on IVF and neo.  On 12/4 he went back to the OR for lumbar fusion. He returned to the unit intubated. He was extubated on 12/6 but became unresponsive and bradycardic and required re intubation.   Due to concern for RV failure and pulmonary hypotension he was placed on milrinone but later stopped due to hypotension. He was also placed on amiodarone due to a fib rvr.Converted to Sinus Tach.  Norepi has been increased to 40 mcg. Unable to wean norepi. Remains intubated on 80% FiO2 .   CVP 18 today.   Echo 08/07/2019 EF 55-60% , RV moderately enlarged, LA,RV mild-moderately dilated. PA pressure  50 mmhg. Flattening of septum.   Review of Systems: [y] = yes, _0  = no  Currently intubated. History taken from the chart.    General: Weight gain _1 ; Weight loss _2 ; Anorexia _3 ; Fatigue _4 ; Fever _5 ; Chills _6 ; Weakness _7    Cardiac: Chest pain/pressure _8 ; Resting SOB _9 ; Exertional SOB _10 ; Orthopnea _11 ; Pedal Edema _12 ; Palpitations _13 ; Syncope _14 ; Presyncope _15 ; Paroxysmal nocturnal dyspnea_16    Pulmonary: Cough _17 ; Wheezing_18 ;  Hemoptysis_19 ; Sputum _20 ; Snoring _21    GI: Vomiting_22 ; Dysphagia_23 ; Melena_24 ; Hematochezia _25 ; Heartburn_26 ; Abdominal pain _27 ; Constipation _28 ; Diarrhea _29 ; BRBPR _30    GU: Hematuria_31 ; Dysuria _32 ; Nocturia_33    Vascular: Pain in legs with walking _34 ; Pain in feet with lying flat _35 ; Non-healing sores _36 ; Stroke _37 ; TIA _38 ; Slurred speech _39 ;   Neuro: Headaches_40 ; Vertigo_41 ; Seizures_42 ; Paresthesias_43 ;Blurred vision _44 ; Diplopia _45 ; Vision changes _46    Ortho/Skin: Arthritis _47 ; Joint pain _48 ; Muscle pain _49 ; Joint swelling _50 ; Back Pain _51 ; Rash _52    Psych: Depression_53 ; Anxiety_54    Heme: Bleeding problems _55 ; Clotting disorders _56 ; Anemia _57    Endocrine: Diabetes _58 ; Thyroid dysfunction_59   Home Medications Prior to Admission medications   Medication Sig Start Date End Date Taking? Authorizing Provider  allopurinol (ZYLOPRIM) 100 MG tablet Take 1 tablet (100 mg total) by mouth daily. 01/14/19  Yes Briscoe Deutscher, DO  aspirin EC 81 MG tablet Take 81 mg by mouth daily.    Yes [provider]  atorvastatin (LIPITOR) 10 MG tablet TAKE 1 TABLET EVERY DAY Patient taking differently: Take 10 mg by mouth every morning.  05/27/19  Yes Miquel Dunn, NP  budesonide-formoterol (SYMBICORT) 160-4.5 MCG/ACT inhaler Inhale 2 puffs into the lungs 2 (two) times daily. Patient taking differently: Inhale 2 puffs into the lungs every morning.  04/01/18  Yes Brand Males, MD  calcium carbonate (CALCIUM 600) 600 MG TABS tablet Take 600 mg by mouth daily.    Yes [provider]  Cholecalciferol (D 10000) 10000 units CAPS Take 10,000 Units by mouth every other day.    Yes [provider]  Coenzyme Q-10 200 MG CAPS Take 200 mg by mouth daily.    Yes [provider]  dorzolamide (TRUSOPT) 2 % ophthalmic solution Place 1 drop into both eyes every morning.  10/29/17  Yes [provider]  Insulin Syringe-Needle U-100 (BD  INSULIN SYRINGE ULTRAFINE) 31G X 5/16" 0.5 ML MISC 1 each by Does not apply route daily. 08/23/18  Yes Briscoe Deutscher, DO  ketorolac (ACULAR) 0.5 % ophthalmic solution Place 1 drop into both eyes 3 (three) times daily.  06/02/18  Yes [provider]  LANTUS 100 UNIT/ML injection INJECT  50 UNITS SUBCUTANEOUSLY AT BEDTIME Patient taking differently: Inject 20 Units into the skin at bedtime.  07/10/19  Yes Leamon Arnt, MD  latanoprost (XALATAN) 0.005 % ophthalmic solution Place 1 drop into both eyes at bedtime.  05/11/19  Yes [provider]  metoprolol succinate (TOPROL-XL) 50 MG 24 hr tablet TAKE 1 TABLET EVERY DAY Patient taking differently: Take 50 mg by mouth daily.  06/13/19  Yes Marin Olp, MD  OXYGEN Inhale 1.5 L into the lungs continuous.    Yes [provider]  sitaGLIPtin (JANUVIA) 50 MG tablet 1 tab daily Patient taking differently: Take 50 mg by mouth daily.  01/14/19  Yes Briscoe Deutscher, DO  SPIRIVA RESPIMAT 2.5 MCG/ACT AERS INHALE 2 PUFFS INTO THE LUNGS DAILY. Patient taking differently: Inhale 2 puffs into the lungs every morning.  04/05/18  Yes Marin Olp, MD  Syringe, Disposable, 1 ML MISC Use with insulin needle. 02/19/17  Yes Briscoe Deutscher, DO  Travoprost, BAK Free, (TRAVATAN Z) 0.004 % SOLN ophthalmic solution Place 1 drop in both eyes once daily 05/13/19  Yes Briscoe Deutscher, DO  triamcinolone (KENALOG) 0.1 % paste Use as directed 1 application in the mouth or throat 2 (two) times daily. Patient taking differently: Use as directed 1 application in the mouth or throat 2 (two) times daily as needed (skin care).  01/11/19  Yes Briscoe Deutscher, DO    Past Medical History: Past Medical History:  Diagnosis Date   Anemia 05/26/2014   Bladder neck obstruction 04/24/2014   Chronic respiratory failure with hypoxia (HCC)    CKD (chronic kidney disease) 05/26/2014   COPD (chronic obstructive pulmonary disease) ([place]) 05/26/2014   Coronary artery  calcification seen on CAT scan    Diabetes mellitus ([place]) 05/26/2014   Former smoker 05/26/2014   GERD (gastroesophageal reflux disease) 05/26/2014   Glaucoma    Heart murmur    mild   HTN (hypertension) 05/26/2014   Ischemic heart disease    Lexiscan myoview stress test 02/08/2018 - no stress sx reported.   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   Recurrent urinary tract infection 09/05/2013   Renal calculus 03/19/2015   Retention of urine 09/05/2013   Sleep apnea    wears Bipap   SOB (shortness of breath)     Past Surgical History: Past Surgical History:  Procedure Laterality Date   ANTERIOR CERVICAL DECOMP/DISCECTOMY FUSION N/A 08/11/2019  Procedure: ANTERIOR CERVICAL DECOMPRESSION/DISCECTOMY FUSION CERVICAL FIVE-SIX, CERVICAL SIX-SEVEN, CERVICAL SEVEN-THORACIC ONE;  Surgeon: Consuella Lose, MD;  Location: [facility];  Service: Neurosurgery;  Laterality: N/A;  ANTERIOR CERVICAL DECOMPRESSION/DISCECTOMY FUSION CERVICAL FIVE-SIX/SIX-SEVEN/SEVEN-THORACIC ONE THREE LEVELS   BLADDER NECK RECONSTRUCTION     CATARACT EXTRACTION, BILATERAL     COLONOSCOPY WITH PROPOFOL N/A 09/22/2017   Procedure: COLONOSCOPY WITH PROPOFOL;  Surgeon: Doran Stabler, MD;  Location: [facility];  Service: Gastroenterology;  Laterality: N/A;   HERNIA REPAIR Bilateral    Inguinal   VIDEO BRONCHOSCOPY WITH ENDOBRONCHIAL NAVIGATION N/A 10/07/2017   Procedure: VIDEO BRONCHOSCOPY WITH ENDOBRONCHIAL NAVIGATION;  Surgeon: Collene Gobble, MD;  Location: [place];  Service: Thoracic;  Laterality: N/A;   VIDEO BRONCHOSCOPY WITH ENDOBRONCHIAL ULTRASOUND N/A 10/07/2017   Procedure: VIDEO BRONCHOSCOPY WITH ENDOBRONCHIAL ULTRASOUND;  Surgeon: Collene Gobble, MD;  Location: [facility];  Service: Thoracic;  Laterality: N/A;    Family History: Family History  Problem Relation Age of Onset   Heart disease Mother    Diabetes Mother     Social History: Social  History   Socioeconomic History   Marital status: Married    Spouse name: Not on file   Number of children: Not on file   Years of education: Not on file   Highest education level: Not on file  Occupational History   Not on file  Social Needs   Financial resource strain: Not on file   Food insecurity    Worry: Not on file    Inability: Not on file   Transportation needs    Medical: Not on file    Non-medical: Not on file  Tobacco Use   Smoking status: Former Smoker    Packs/day: 0.10    Years: 2.00    Pack years: 0.20    Types: Cigarettes    Quit date: 12/31/2007    Years since quitting: 11.5   Smokeless tobacco: Former Systems developer   Tobacco comment: smokes 4-5 every other day  Substance and Sexual Activity   Alcohol use: No   Drug use: No   Sexual activity: Yes    Partners: Female  Lifestyle   Physical activity    Days per week: Not on file    Minutes per session: Not on file   Stress: Not on file  Relationships   Social connections    Talks on phone: Not on file    Gets together: Not on file    Attends religious service: Not on file    Active member of club or organization: Not on file    Attends meetings of clubs or organizations: Not on file    Relationship status: Not on file  Other Topics Concern   Not on file  Social History Narrative   Still actively driving without problems     Allergies:  No Known Allergies  Objective:    Vital Signs:   Temp:  [97.5 F (36.4 C)-99.2 F (37.3 C)] 99.2 F (37.3 C) (12/07 1200) Pulse Rate:  [70-142] 111 (12/07 1100) Resp:  [18-29] 24 (12/07 1100) BP: (54-136)/(11-98) 115/98 (12/07 1100) SpO2:  [89 %-100 %] 100 % (12/07 1100) Arterial Line BP: (84-125)/(39-46) 114/44 (12/07 1100) FiO2 (%):  [60 %-100 %] 100 % (12/07 1104) Weight:  [90.3 kg] 90.3 kg (12/07 0500) Last BM Date: 08/11/2019  Weight change: Filed Weights   07/21/2019 1355 07/16/2019 1937 07/18/19 0500  Weight: 80.7 kg 78.5 kg 90.3 kg     Intake/Output:   Intake/Output Summary (  Last 24 hours) at 07/18/2019 1223 Last data filed at 07/18/2019 1100 Gross per 24 hour  Intake 3470.39 ml  Output 340 ml  Net 3130.39 ml      Physical Exam   CVP 18 personally checked.  General: Intubated.  HEENT: C Collar ETT Neck: supple. JVP to jaw  . Carotids 2+ bilat; no bruits. No lymphadenopathy or thyromegaly appreciated. Cor: PMI nondisplaced. Regular rate & rhythm. No rubs, gallops or murmurs. Lungs: Crackles.  Abdomen: soft, nontender, nondistended. No hepatosplenomegaly. No bruits or masses. Good bowel sounds. Extremities: no cyanosis, clubbing, rash, R and LLE 2-3+ edema. LUE arterial line. RUE PICC Neuro: intubated and awake . Moves upper extremity.   Telemetry  Sinus Tach 110s personally reviewed.  EKG    N/a   Labs   Basic Metabolic Panel: Recent Labs  Lab 07/14/19 0427  08/08/2019 0416  07/19/2019 2000  07/16/19 4098  07/17/19 0511  07/17/19 1337 07/17/19 1654 07/17/19 1713 07/17/19 2350 07/18/19 0346 07/18/19 0456 07/18/19 1106  NA 139   < > 140   < > 142   < > 148*   < > 148*   < >  --  147*  --  148* 148* 148* 147*  K 5.4*   < > 4.9   < > 5.3*   < > 4.6   < > 4.2   < >  --  4.1  --  4.1 4.3 4.6 4.3  CL 107   < > 115*  --  116*  --  120*  --  117*  --   --   --   --   --   --  119*  --   CO2 22   < > 18*  --  14*  --  18*  --  19*  --   --   --   --   --   --  19*  --   GLUCOSE 189*   < > 155*  --  200*  --  201*  --  153*  --   --   --   --   --   --  358*  --   BUN 58*   < > 57*  --  58*  --  60*  --  65*  --   --   --   --   --   --  81*  --   CREATININE 2.00*   < > 1.93*  --  2.10*  --  2.08*  --  2.14*  --   --   --   --   --   --  2.72*  --   CALCIUM 8.1*   < > 7.6*  --  8.3*  --  7.7*  --  7.7*  --   --   --   --   --   --  7.6*  --   MG 2.3  --   --   --   --   --   --   --   --   --  2.1  --  2.1  --   --  2.2  --   PHOS 5.9*  --   --   --   --   --   --   --   --   --  4.2  --  4.4  --   --   4.1  --    < > = values in this  interval not displayed.    Liver Function Tests: Recent Labs  Lab 07/31/2019 2125 07/14/19 0427 07/18/19 0456  AST 47* 39 80*  ALT 40 30 161*  ALKPHOS 73 52 64  BILITOT 0.9 0.5 0.7  PROT 7.1 5.8* 5.0*  ALBUMIN 3.1* 2.8* 2.2*   No results for input(s): LIPASE, AMYLASE in the last 168 hours. No results for input(s): AMMONIA in the last 168 hours.  CBC: Recent Labs  Lab 07/16/2019 2125  08/05/2019 0416  07/16/2019 2000  07/16/19 0608  07/17/19 0511  07/17/19 1654 07/17/19 2350 07/18/19 0346 07/18/19 0456 07/18/19 1106  WBC 20.3*   < > 12.6*  --  20.7*  --  13.6*  --  17.4*  --   --   --   --  29.0*  --   NEUTROABS 17.9*  --   --   --   --   --   --   --   --   --   --   --   --   --   --   HGB 12.9*   < > 9.1*   < > 9.9*   < > 9.8*   < > 10.5*   < > 10.2* 10.2* 10.2* 10.3* 9.2*  HCT 40.4   < > 29.0*   < > 31.6*   < > 30.6*   < > 33.1*   < > 30.0* 30.0* 30.0* 33.3* 27.0*  MCV 98.3   < > 101.4*  --  101.9*  --  99.7  --  98.8  --   --   --   --  103.4*  --   PLT 152   < > 113*  --  120*  --  108*  --  123*  --   --   --   --  121*  --    < > = values in this interval not displayed.    Cardiac Enzymes: No results for input(s): CKTOTAL, CKMB, CKMBINDEX, TROPONINI in the last 168 hours.  BNP: BNP (last 3 results) No results for input(s): BNP in the last 8760 hours.  ProBNP (last 3 results) No results for input(s): PROBNP in the last 8760 hours.   CBG: Recent Labs  Lab 07/17/19 1928 07/17/19 2259 07/18/19 0259 07/18/19 0734 07/18/19 1111  GLUCAP 227* 238* 293* 279* 316*    Coagulation Studies: No results for input(s): LABPROT, INR in the last 72 hours.   Imaging   Dg Chest Port 1 View  Result Date: 07/18/2019 CLINICAL DATA:  Atelectasis. EXAM: PORTABLE CHEST 1 VIEW COMPARISON:  Radiograph yesterday. FINDINGS: Endotracheal tube tip 5.7 cm in the carina. Enteric tube in place tip below the diaphragm in the left upper quadrant. Right  upper extremity PICC tip in the SVC. Unchanged bilateral pleural effusions and bibasilar opacities/atelectasis. Unchanged heart size and mediastinal contours. Slight increase patchy airspace disease in the mid lower lung zones, right greater than left. No pneumothorax. Midline skin staples. IMPRESSION: 1. Endotracheal tube tip 5.7 cm in the carina. 2. Unchanged bilateral pleural effusions and bibasilar opacities/atelectasis. 3. Slight increase in patchy airspace disease in the mid lower lung zones, right greater than left, may be pulmonary edema or infection. Electronically Signed   By: Keith Rake M.D.   On: 07/18/2019 04:17      Medications:     Current Medications:  arformoterol  15 mcg Nebulization BID   atorvastatin  10 mg Per Tube Daily   budesonide (PULMICORT) nebulizer solution  0.25 mg Nebulization BID   calcium carbonate  1 tablet Per Tube Daily   chlorhexidine gluconate (MEDLINE KIT)  15 mL Mouth Rinse BID   Chlorhexidine Gluconate Cloth  6 each Topical Daily   cholecalciferol  5,000 Units Per Tube Daily   clonazepam  0.25 mg Per Tube BID   docusate  100 mg Per Tube BID   dorzolamide  1 drop Both Eyes [id number]   feeding supplement (PRO-STAT SUGAR FREE 64)  30 mL Per Tube BID   free water  100 mL Per Tube Q8H   hydrocortisone sod succinate (SOLU-CORTEF) inj  50 mg Intravenous Q6H   insulin aspart  0-20 Units Subcutaneous Q4H   insulin detemir  10 Units Subcutaneous BID   ipratropium  0.5 mg Nebulization Q6H   ketorolac  1 drop Both Eyes TID   latanoprost  1 drop Both Eyes QHS   mouth rinse  15 mL Mouth Rinse 10 times per day   [START ON 07/19/2019] pantoprazole sodium  40 mg Per Tube Daily   senna  1 tablet Per Tube BID   sodium chloride flush  10-40 mL Intracatheter Q12H     Infusions:  sodium chloride 10 mL/hr at 07/18/19 1100   amiodarone 30 mg/hr (07/18/19 1100)   feeding supplement (VITAL AF 1.2 CAL)     fentaNYL infusion INTRAVENOUS  Stopped (07/16/19 1702)   milrinone Stopped (07/17/19 1712)   norepinephrine (LEVOPHED) Adult infusion 40 mcg/min (07/18/19 1100)   sodium bicarbonate 150 mEq in dextrose 5% 1000 mL     [START ON 07/19/2019] vancomycin     vasopressin (PITRESSIN) infusion - *FOR SHOCK*           Assessment/Plan    1. Fall - Central Cord Compression  T12-L1 Fracture S/P Stabilization Thoracolumbar Fracture 12/4 Per Neurosurgery   2. Shock  ? Neurogenic versus Cardiogenis Shock  Will need swan to further determine clinical course.  Concern for R sided failure.  - Move to ICU   3. Acute on chronic hypoxic respiratory railure  Extubated but required reintubation 12/6/  CCM managing Vent.  4.A/C Cor pulmonale Marked volume overload. CVP 18. Received 60 mg IV lasix earlier today with poor response.  - Needs aggressive diuresis. Will try to diurese as BP permits.  - Check CO-OX   5. Pulmonary Hypertension  Elevated PA pressures on ECHO. Will need to swan as noted above. May need inhaled nitric oxide.   6. A Fib RVR Placed on amiodarone drip with conversion to Sinus Tach   7. AKI on CKD Stage IIIb  Creatinine baseline 1.8.  Creatinine has gone up to 2.7.   Move to [facility] for swan placement. Dr Haroldine Laws discussed with Neurosurgery and CCM.    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  Admitted after a fall with cervical and lumbar fractures and cervical cord injury.   Intra-operatively developed severe hypotension requiring high-dose pressors. Remains intubated. On NE 40. Echo with LVEF 65%. RV dilated but normal function. There is septal flattening c/w RV pressure volume overload. CVP 18. Co-ox pending. Creatinine up 1.8 -> 2.7   Exam   General:  Elderly obese male Intubated. Awake on vent HEENT: normal Neck: In cervical collar. JVP to  ear Carotids 2+ bilat; no bruits. No lymphadenopathy or thryomegaly appreciated. Cor: PMI nondisplaced. Tachy regular + RV lift 2/6 TR Lungs: coarse anteriorly  Abdomen: obese soft, nontender, ++ distended. No hepatosplenomegaly. No bruits or masses. Good bowel sounds. Extremities: no cyanosis, clubbing, rash, 3-4+ edema Neuro: awake on vent LE weak   He is currently in shock. Suspect cor pulmonale but may also have a component of distributive/neruogenic shock due to vasoplegia from spinal cord injury +/- sepsis. Will transfer to [facility] and place Swan. If cor pulmonale can consider trial of iNO. Failed milrinone last night due to hypotension concerns me for neurogenic cause.   D/w CCM, NSU and patient's family.   CRITICAL CARE Performed by: Glori Bickers  Total critical care time: 45 minutes  Critical care time was exclusive of separately billable procedures and treating other patients.  Critical care was necessary to treat or prevent imminent or life-threatening deterioration.  Critical care was time spent personally by me (independent of midlevel providers or residents) on the following activities: development of treatment plan with patient and/or surrogate as well as nursing, discussions with consultants, evaluation of patient's response to treatment, examination of patient, obtaining history from patient or surrogate, ordering and performing treatments and interventions, ordering and review of laboratory studies, ordering and review of radiographic studies, pulse oximetry and re-evaluation of patient's condition.  Glori Bickers, MD  5:34 PM

## 2019-07-18 NOTE — Progress Notes (Signed)
No secretions at this time to send to the lab for analysis.

## 2019-07-18 NOTE — Op Note (Signed)
NEUROSURGERY OPERATIVE NOTE   PREOP DIAGNOSIS:  1. T12-L1 Chance Fracture   POSTOP DIAGNOSIS: Same  PROCEDURE: 1. Open reduction, internal fixation T12-L1 Chance fracture 2. Posterior segmental instrumentation, T10-L3 - Medtronic Solera screws 3. Posterolateral arthrodesis, T10-L3 4. Use of non-structural bone allograft - DBX 5. Use of intraoperative robotic assistance  SURGEON: Dr. Consuella Lose, MD  ASSISTANT: Ferne Reus, PA-C  ANESTHESIA: General Endotracheal  EBL: 500cc  SPECIMENS: None  DRAINS: Subcutaneous JP  COMPLICATIONS: None immediate  CONDITION: Guarded to ICU  HISTORY: Lucas Price is a 78 y.o. male initially presenting to the emergency department after a fall.  He has significant medical comorbidities at baseline including severe restrictive pulmonary disease requiring home oxygen and nightly BiPAP.  His work-up in the ER included CT scan of the cervical and lumbar spine as well as MRI of these areas.  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  He did undergo cervical decompression and fusion 48 hours ago.  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  This fracture was noted to be unstable with disruption of all major supporting ligaments on MRI.  He remained relatively stable from a hemodynamic and dynamic and cardiopulmonary standpoint and therefore presents today for stabilization of his thoracolumbar fracture.  The risks and benefits of the surgery were reviewed in detail with both the patient as well as his family including his son and nephew who is a Engineer, drilling, ex-spine Psychologist, sport and exercise overseas.  After all their questions were answered, informed consent was obtained and witnessed.  PROCEDURE IN DETAIL: The patient was brought to the operating room. After induction of general  anesthesia, the patient was positioned on the operative table in the prone position. All pressure points were meticulously padded. Skin incision was then marked out and prepped and draped in the usual sterile fashion.  After timeout was conducted, a Schanz pin was introduced into the left posterior superior iliac spine.  The Mazor robot was then attached to the Schanz pin.  Oblique fluoroscopic images were taken and registered with the preoperative stereotactic CT scan.    Good registration of all 6 levels was achieved in a single field.  The surface projection of the upper and lower pedicles were then marked out.  Midline skin incision was then infiltrated with local anesthetic with epinephrine.  Incision was then made sharply and carried down through the subcutaneous tissue until the thoracodorsal fascia was identified.  The skin was then undermined just above the level of the fascia.  Utilizing the robotic system, the T10-L3 pedicles were sequentially accessed bilaterally and K wires were placed.  Position of the K wires was then confirmed again with AP and lateral fluoroscopy.  At this point, 6.5 millimeters screws were placed at the upper 3 levels, and 7.5 millimeter screws were placed at the lower 3 levels.    With the left L2 and L3 K wires in place, during the tapping stage of the screw insertion, the anesthesia service noted significant drop in the patient's blood pressure, down into the 50 mmHg range for his systolic pressure.  This pressure was confirmed with noninvasive blood pressure monitoring as well.  The patient required administration of epinephrine in order to raise his blood pressure.  Fluoroscopic images did not demonstrate any protrusion of any K wire beyond the anterior cortex of  the vertebral body.  There is no bleeding noted in the operative field.  Nonetheless, given his significant hemodynamic instability, I elected not to proceed with cement augmentation of the screws in an  attempt to complete the procedure as expeditiously as possible.  The remaining 2 screws were placed.  A straight 120 mm rod was passed subfascially.  Setscrews were then placed and final tightened.    The exposed bone surfaces including portions of the lamina and facet complex at T10, T11, T12, L1, L2, and L3 were decorticated with a high-speed drill.  Demineralized bone matrix was placed over the exposed bony surfaces in order to facilitate posterolateral arthrodesis.  Final AP and lateral fluoroscopic images demonstrated good position of the hardware and good thoracolumbar alignment.  At this point, the fascial incisions were closed with interrupted 0 Vicryl stitches.  A flat drain was then placed and tunneled subcutaneously.  The subcutaneous layer was closed with interrupted 0 Vicryl stitches and the skin was closed with staples.  The incision for the Schanz pin was also closed with interrupted 3-0 Vicryl stitches.  Bacitracin ointment and sterile dressings were applied.  The patient was then transferred to the bed and taken to the intensive care unit in guarded condition.

## 2019-07-18 NOTE — Progress Notes (Signed)
Inpatient Diabetes Program Recommendations  AACE/ADA: New Consensus Statement on Inpatient Glycemic Control (2015)  Target Ranges:  Prepandial:   less than 140 mg/dL      Peak postprandial:   less than 180 mg/dL (1-2 hours)      Critically ill patients:  140 - 180 mg/dL   Lab Results  Component Value Date   GLUCAP 316 (H) 07/18/2019   HGBA1C 5.1 05/30/2019    Review of Glycemic Control Results for Lucas Price, Lucas Price (MRN [id number]) as of 07/18/2019 12:35  Ref. Range 07/17/2019 19:28 07/17/2019 22:59 07/18/2019 02:59 07/18/2019 07:34 07/18/2019 11:11  Glucose-Capillary Latest Ref Range: 70 - 99 mg/dL 227 (H) 238 (H) 293 (H) 279 (H) 316 (H)   Diabetes history: DM 2 Outpatient Diabetes medications: Lantus 20 units q HS, Januvia  50 mg daily Current orders for Inpatient glycemic control:  Novolog resistant q 4 hours Levemir 10 units bid Solu-cortef 50 mg IV q 6 hours Inpatient Diabetes Program Recommendations:   Please consider adding Novolog 3 units q 4 hours with tube feeds.    Thanks,  Adah Perl, RN, BC-ADM Inpatient Diabetes Coordinator Pager [phone] (8a-5p)

## 2019-07-19 ENCOUNTER — Inpatient Hospital Stay (HOSPITAL_COMMUNITY): Payer: Medicare Other

## 2019-07-19 DIAGNOSIS — R578 Other shock: Secondary | ICD-10-CM

## 2019-07-19 DIAGNOSIS — J42 Unspecified chronic bronchitis: Secondary | ICD-10-CM | POA: Diagnosis not present

## 2019-07-19 DIAGNOSIS — E1121 Type 2 diabetes mellitus with diabetic nephropathy: Secondary | ICD-10-CM

## 2019-07-19 DIAGNOSIS — R570 Cardiogenic shock: Secondary | ICD-10-CM | POA: Diagnosis not present

## 2019-07-19 DIAGNOSIS — K5903 Drug induced constipation: Secondary | ICD-10-CM

## 2019-07-19 DIAGNOSIS — S14129S Central cord syndrome at unspecified level of cervical spinal cord, sequela: Secondary | ICD-10-CM

## 2019-07-19 DIAGNOSIS — N179 Acute kidney failure, unspecified: Secondary | ICD-10-CM

## 2019-07-19 DIAGNOSIS — I82409 Acute embolism and thrombosis of unspecified deep veins of unspecified lower extremity: Secondary | ICD-10-CM | POA: Diagnosis not present

## 2019-07-19 DIAGNOSIS — J9601 Acute respiratory failure with hypoxia: Secondary | ICD-10-CM | POA: Diagnosis not present

## 2019-07-19 DIAGNOSIS — N1831 Chronic kidney disease, stage 3a: Secondary | ICD-10-CM

## 2019-07-19 DIAGNOSIS — I50810 Right heart failure, unspecified: Secondary | ICD-10-CM | POA: Diagnosis not present

## 2019-07-19 LAB — COMPREHENSIVE METABOLIC PANEL
ALT: 218 U/L — ABNORMAL HIGH (ref 0–44)
AST: 135 U/L — ABNORMAL HIGH (ref 15–41)
Albumin: 1.9 g/dL — ABNORMAL LOW (ref 3.5–5.0)
Alkaline Phosphatase: 85 U/L (ref 38–126)
Anion gap: 13 (ref 5–15)
BUN: 102 mg/dL — ABNORMAL HIGH (ref 8–23)
CO2: 20 mmol/L — ABNORMAL LOW (ref 22–32)
Calcium: 7.6 mg/dL — ABNORMAL LOW (ref 8.9–10.3)
Chloride: 115 mmol/L — ABNORMAL HIGH (ref 98–111)
Creatinine, Ser: 3.33 mg/dL — ABNORMAL HIGH (ref 0.61–1.24)
GFR calc Af Amer: 20 mL/min — ABNORMAL LOW (ref >60)
GFR calc non Af Amer: 17 mL/min — ABNORMAL LOW (ref >60)
Glucose, Bld: 318 mg/dL — ABNORMAL HIGH (ref 70–99)
Potassium: 4.6 mmol/L (ref 3.5–5.1)
Sodium: 148 mmol/L — ABNORMAL HIGH (ref 135–145)
Total Bilirubin: 1.1 mg/dL (ref 0.3–1.2)
Total Protein: 5 g/dL — ABNORMAL LOW (ref 6.5–8.1)

## 2019-07-19 LAB — COOXEMETRY PANEL
Carboxyhemoglobin: 1.1 % (ref 0.5–1.5)
Carboxyhemoglobin: 1.6 % — ABNORMAL HIGH (ref 0.5–1.5)
Methemoglobin: 1 % (ref 0.0–1.5)
Methemoglobin: 1.2 % (ref 0.0–1.5)
O2 Saturation: 54.3 %
O2 Saturation: 59.4 %
Total hemoglobin: 11.5 g/dL — ABNORMAL LOW (ref 12.0–16.0)
Total hemoglobin: 9.4 g/dL — ABNORMAL LOW (ref 12.0–16.0)

## 2019-07-19 LAB — BASIC METABOLIC PANEL
Anion gap: 11 (ref 5–15)
BUN: 116 mg/dL — ABNORMAL HIGH (ref 8–23)
CO2: 19 mmol/L — ABNORMAL LOW (ref 22–32)
Calcium: 7.7 mg/dL — ABNORMAL LOW (ref 8.9–10.3)
Chloride: 118 mmol/L — ABNORMAL HIGH (ref 98–111)
Creatinine, Ser: 3.59 mg/dL — ABNORMAL HIGH (ref 0.61–1.24)
GFR calc Af Amer: 18 mL/min — ABNORMAL LOW (ref >60)
GFR calc non Af Amer: 16 mL/min — ABNORMAL LOW (ref >60)
Glucose, Bld: 215 mg/dL — ABNORMAL HIGH (ref 70–99)
Potassium: 4.1 mmol/L (ref 3.5–5.1)
Sodium: 148 mmol/L — ABNORMAL HIGH (ref 135–145)

## 2019-07-19 LAB — CBC
HCT: 31.6 % — ABNORMAL LOW (ref 39.0–52.0)
Hemoglobin: 9.4 g/dL — ABNORMAL LOW (ref 13.0–17.0)
MCH: 31.3 pg (ref 26.0–34.0)
MCHC: 29.7 g/dL — ABNORMAL LOW (ref 30.0–36.0)
MCV: 105.3 fL — ABNORMAL HIGH (ref 80.0–100.0)
Platelets: 88 10*3/uL — ABNORMAL LOW (ref 150–400)
RBC: 3 MIL/uL — ABNORMAL LOW (ref 4.22–5.81)
RDW: 17.2 % — ABNORMAL HIGH (ref 11.5–15.5)
WBC: 24.3 10*3/uL — ABNORMAL HIGH (ref 4.0–10.5)
nRBC: 8.5 % — ABNORMAL HIGH (ref 0.0–0.2)

## 2019-07-19 LAB — GLUCOSE, CAPILLARY
Glucose-Capillary: 285 mg/dL — ABNORMAL HIGH (ref 70–99)
Glucose-Capillary: 250 mg/dL — ABNORMAL HIGH (ref 70–99)
Glucose-Capillary: 239 mg/dL — ABNORMAL HIGH (ref 70–99)
Glucose-Capillary: 192 mg/dL — ABNORMAL HIGH (ref 70–99)

## 2019-07-19 LAB — PATHOLOGIST SMEAR REVIEW

## 2019-07-19 MED ORDER — VANCOMYCIN VARIABLE DOSE PER UNSTABLE RENAL FUNCTION (PHARMACIST DOSING): Status: DC

## 2019-07-19 MED ORDER — LACTULOSE 10 GM/15ML PO SOLN
10.0000 g | Freq: Once | ORAL | Status: AC
  Administered 2019-07-19: 10 g
  Filled 2019-07-19: qty 15

## 2019-07-19 MED ORDER — INSULIN DETEMIR 100 UNIT/ML ~~LOC~~ SOLN
15.0000 [IU] | Freq: Two times a day (BID) | SUBCUTANEOUS | Status: DC
  Administered 2019-07-19: 15 [IU] via SUBCUTANEOUS
  Filled 2019-07-19 (×2): qty 0.15

## 2019-07-19 MED ORDER — FUROSEMIDE 10 MG/ML IJ SOLN
30.0000 mg/h | INTRAVENOUS | Status: DC
  Administered 2019-07-19: 15 mg/h via INTRAVENOUS
  Administered 2019-07-20 (×2): 30 mg/h via INTRAVENOUS
  Administered 2019-07-20: 15 mg/h via INTRAVENOUS
  Administered 2019-07-21 (×2): 30 mg/h via INTRAVENOUS
  Filled 2019-07-19 (×3): qty 25
  Filled 2019-07-19: qty 21
  Filled 2019-07-19 (×4): qty 25

## 2019-07-19 MED ORDER — INSULIN ASPART 100 UNIT/ML ~~LOC~~ SOLN
5.0000 [IU] | SUBCUTANEOUS | Status: DC
  Administered 2019-07-19 – 2019-07-24 (×26): 5 [IU] via SUBCUTANEOUS

## 2019-07-19 MED ORDER — INSULIN DETEMIR 100 UNIT/ML ~~LOC~~ SOLN
20.0000 [IU] | Freq: Two times a day (BID) | SUBCUTANEOUS | Status: DC
  Administered 2019-07-19 – 2019-07-25 (×11): 20 [IU] via SUBCUTANEOUS
  Filled 2019-07-19 (×14): qty 0.2

## 2019-07-19 MED ORDER — POLYETHYLENE GLYCOL 3350 17 G PO PACK
17.0000 g | PACK | Freq: Every day | ORAL | Status: DC
  Administered 2019-07-19 – 2019-07-25 (×7): 17 g
  Filled 2019-07-19 (×7): qty 1

## 2019-07-19 MED ORDER — EPINEPHRINE HCL 5 MG/250ML IV SOLN IN NS
0.5000 ug/min | INTRAVENOUS | Status: DC
  Administered 2019-07-19: 14:00:00 4 ug/min via INTRAVENOUS
  Administered 2019-07-20: 4.8 ug/min via INTRAVENOUS
  Administered 2019-07-20: 5 ug/min via INTRAVENOUS
  Administered 2019-07-21 (×2): 4.8 ug/min via INTRAVENOUS
  Administered 2019-07-22: 10 ug/min via INTRAVENOUS
  Administered 2019-07-22: 10.5 ug/min via INTRAVENOUS
  Administered 2019-07-22 – 2019-07-23 (×4): 10 ug/min via INTRAVENOUS
  Administered 2019-07-23: 12 ug/min via INTRAVENOUS
  Administered 2019-07-24 (×4): 10 ug/min via INTRAVENOUS
  Administered 2019-07-25 (×2): 12 ug/min via INTRAVENOUS
  Administered 2019-07-25: 10 ug/min via INTRAVENOUS
  Administered 2019-07-25 (×2): 12 ug/min via INTRAVENOUS
  Filled 2019-07-19 (×22): qty 250

## 2019-07-19 MED ORDER — FUROSEMIDE 10 MG/ML IJ SOLN
100.0000 mg | Freq: Once | INTRAVENOUS | Status: AC
  Administered 2019-07-19: 10:00:00 100 mg via INTRAVENOUS
  Filled 2019-07-19: qty 10

## 2019-07-19 MED ORDER — PIPERACILLIN-TAZOBACTAM IN DEX 2-0.25 GM/50ML IV SOLN
2.2500 g | Freq: Three times a day (TID) | INTRAVENOUS | Status: DC
  Administered 2019-07-19 – 2019-07-21 (×6): 2.25 g via INTRAVENOUS
  Filled 2019-07-19 (×10): qty 50

## 2019-07-19 NOTE — Progress Notes (Signed)
Thin tan secretions collected and sent to the lab

## 2019-07-19 NOTE — Progress Notes (Signed)
Inpatient Diabetes Program Recommendations  AACE/ADA: New Consensus Statement on Inpatient Glycemic Control   Target Ranges:  Prepandial:   less than 140 mg/dL      Peak postprandial:   less than 180 mg/dL (1-2 hours)      Critically ill patients:  140 - 180 mg/dL   Results for Lucas Price, Lucas Price (MRN [id number]) as of 07/19/2019 11:50  Ref. Range 07/18/2019 07:34 07/18/2019 11:11 07/18/2019 15:08 07/18/2019 20:59 07/18/2019 23:27 07/19/2019 04:27 07/19/2019 07:43 07/19/2019 11:33  Glucose-Capillary Latest Ref Range: 70 - 99 mg/dL 279 (H) 316 (H) 243 (H) 271 (H) 263 (H) 285 (H) 250 (H) 239 (H)   Review of Glycemic Control  Diabetes history: DM2 Outpatient Diabetes medications: Lantus 20 units QHS, Januvia 50 mg daily Current orders for Inpatient glycemic control: Levemir 15 units BID, Novolog 5 units Q4H tube feeding coverage, Novolog 0-20 units Q4H; Solucortef 50 mg Q6H  Inpatient Diabetes Program Recommendations:   Insulin - Basal: If steroids are continued as ordered, please consider increasing Levemir further to 20 units BID.  Insulin-If glucose continues to be higher than 200 mg/dl, please consider discontinuing all SQ insulin orders and use ICU Glycemic Control Phase 2 IV insulin.  Thanks, Barnie Alderman, RN, MSN, CDE Diabetes Coordinator Inpatient Diabetes Program [phone] (Team Pager from 8am to 5pm)

## 2019-07-19 NOTE — Progress Notes (Signed)
Bilateral lower extremity venous duplex completed. Refer to "CV Proc" under chart review to view preliminary results.  07/19/2019 11:27 AM Kelby Aline., MHA, RVT, RDCS, RDMS

## 2019-07-19 NOTE — Progress Notes (Signed)
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  He has a past medical history of insulin-dependent diabetes,  12/1 fall while climbing stairs, present to ED 12/2 found to have central cord syndrome, taken to OR for decompression.  12/3 Hypotensive, PCCM consult.  12/4 more hypoxic. Requiring HFO2 at 15 liters.  went back to OR  for Lumbar 3 fusion of unstable lumbar fracture. Returned to ICU on vent  12/5 weaning but lower Vts, upper extremity strength seems improved.  12/6: He remains on low-dose Neo-Synephrine, but also on fentanyl infusion.  Looks comfortable on spontaneous breathing trial and has acceptable frequency/tidal volume ratio for extubation so order has been written to extubate. Was extubated. Was initially awake. Alert. Following commands and in no distress. Then became suddenly unresponsive, acutely hypotensive and began to brady down. Was emergently re-intubated. Working dx at this point raising concern for sig cardiac component  12/7: Remains FiO2 100%, PEEP 10. Awaiting ECHO. Max on NE. transferred to to heart for PA catheter placement.   Consults:  Cardiology, heart failure Neurosurgery  Procedures:  PA catheter placed 12/7: On NE 50  CVP 14 PA 61/30 (44) PCWP 12 SVR 877 PVR 8.0 WU Thermo CO/CI = 4.0/2.1  Significant Diagnostic Tests:  Echocardiogram on 12/7 demonstrates elevated right atrial pressures, estimated RVSP is 102 mmHg, the RV apex is akinetic and the left ventricle systolic ejection fraction is 60 to 65%.  Micro Data:  Urinalysis on 12 7 with pyuria, bacteria present. Q aspirate 12/8 consistent with normal respiratory flora MRSA PCR on 12/4 is negative. Blood cultures 12/7 pending. COVID-19  negative on 12/1 Antimicrobials:  Currently on vancomycin, and piperacillin tazobactam.  Interim history/subjective:  Transferred to to heart yesterday for PA catheter placement.  PA catheter consistent with isolated RV failure, possibly some component of neurogenic shock as well.  Night had low-grade fevers.  Objective   Blood pressure 123/65, pulse (!) 109, temperature (!) 100.8 F (38.2 C), resp. rate (!) 26, height 5\' 3"  (1.6 m), weight 89.4 kg, SpO2 99 %. PAP: (20-73)/(15-41) 65/39 CVP:  [8 mmHg-17 mmHg] 17 mmHg PCWP:  [12 mmHg-36 mmHg] 34 mmHg CO:  [3.6 L/min-4 L/min] 3.8 L/min CI:  [1.9 L/min/m2-2.1 L/min/m2] 2 L/min/m2  Vent Mode: PRVC FiO2 (%):  [70 %-100 %] 70 % Set Rate:  [24 bmp-26 bmp] 26 bmp Vt Set:  [450 mL] 450 mL PEEP:  [10 cmH20] 10 cmH20 Plateau Pressure:  [22 cmH20-27 cmH20] 22 cmH20   Intake/Output Summary (Last 24 hours) at 07/19/2019 0820 Last data filed at 07/19/2019 0600 Gross per 24 hour  Intake 2755.32 ml  Output 230 ml  Net 2525.32 ml   Filed Weights   08/04/2019 1937 07/18/19 0500 07/19/19 0500  Weight: 78.5 kg 90.3 kg 89.4 kg    Examination: General: Debated, sedated, appears calm. HENT: sclera nonicteric Lungs: Diminished, bilaterally no wheezes or crackles no increased work of breathing. Cardiovascular: Tachycardiac, regular, murmur Abdomen: Distended, hypoactive bowel sounds, no tenderness to palpation Extremities: Diffuse bilateral upper and lower extremity pitting edema 2+ Neuro: Moves all 4 extremities MSK: No rashes Lines: Right PA catheter, sided PICC line, Foley catheter, OG tube, endotracheal tube, left radial arterial line   Assessment & Plan:  Is a  77 year old gentleman with insulin-dependent diabetes, coronary artery disease which has not been intervened on, and CKD stage III who presents initially with central cord syndrome now found to have mixed shock and respiratory failure.  Possible mixed neurogenic cardiogenic shock with  RV failure Hypoxemic respiratory failure Hospital-acquired pneumonia Hyperglycemia with type 2 diabetes mellitus Acute kidney injury on CKD Coronary artery disease, with abnormal stress test. Central cord syndrome Atrial fibrillation with RVR  Initially presented status post fall, was found to have cord syndrome and severe spinal cord stenosis.  He underwent neurosurgical intervention to relieve this on 12/4.  His postoperative course has been complicated by worsening hypotension and hypoxemia.  His shock progressed and he was reintubated.    His cardiology work-up thus far has demonstrates isolated RV failure.  Really meet any risk factors for primary pulmonary hypertension.  He has not been on DVT prophylaxis and is at risk for venous thromboembolism, he also had an abnormal stress test and we have not evaluated for ACS to see if he could have infarcted his RV.  Will order lower extremity Dopplers.  He remains volume overloaded on exam, and diuresis has been held due to his kidney injury in the past.   I will discuss with heart failure team, but I do think he requires diuresis, I will start Lasix drip today.He is on norepinephrine at a very high dose, was started on low-dose epinephrine for low cardiac output yesterday, the vasopressin does not seem to have done much.  On stress dose steroids for now with some component of neurogenic shock, but I suspect this can probably be titrated down.  Broad-spectrum antibiotic therapy for hospital-acquired pneumonia.  We will continue this for now. Increase his insulin for his hyperglycemia.  Leukocytosis is probably from the steroids.  Best practice:  Diet: Tube feeds are ordered.  Will be advanced to goal. Pain/Anxiety/Delirium protocol (if indicated): Fentanyl ordered VAP protocol (if indicated): VAP is ordered DVT prophylaxis: SCDs GI prophylaxis: PPI Glucose control: Not well controlled, will increase insulin today.   Foley: yes Mobility:  Bedrest Code Status: Full code Family Communication: Patient has a nephew who has been checking in.  Will update when able. Disposition: Needs ICU   Labs   Mixed venous on 12/8 morning was 54.3% distant with a low cardiac output state.  CBC: Recent Labs  Lab 08/04/2019 2125  07/16/2019 2000  07/16/19 0608  07/17/19 0511  07/17/19 2350 07/18/19 0346 07/18/19 0456 07/18/19 1106 07/19/19 0420  WBC 20.3*   < > 20.7*  --  13.6*  --  17.4*  --   --   --  29.0*  --  24.3*  NEUTROABS 17.9*  --   --   --   --   --   --   --   --   --   --   --   --   HGB 12.9*   < > 9.9*   < > 9.8*   < > 10.5*   < > 10.2* 10.2* 10.3* 9.2* 9.4*  HCT 40.4   < > 31.6*   < > 30.6*   < > 33.1*   < > 30.0* 30.0* 33.3* 27.0* 31.6*  MCV 98.3   < > 101.9*  --  99.7  --  98.8  --   --   --  103.4*  --  105.3*  PLT 152   < > 120*  --  108*  --  123*  --   --   --  121*  --  88*   < > = values in this interval not displayed.    Basic Metabolic Panel: Recent Labs  Lab 07/14/19 0427  07/13/2019 2000  07/16/19 9030  07/17/19 0511  07/17/19 1337  07/17/19 1713 07/17/19 2350 07/18/19 0346 07/18/19 0456 07/18/19 1106 07/18/19 1602 07/19/19 0420  NA 139   < > 142   < > 148*   < > 148*   < >  --    < >  --  148* 148* 148* 147*  --  148*  K 5.4*   < > 5.3*   < > 4.6   < > 4.2   < >  --    < >  --  4.1 4.3 4.6 4.3  --  4.6  CL 107   < > 116*  --  120*  --  117*  --   --   --   --   --   --  119*  --   --  115*  CO2 22   < > 14*  --  18*  --  19*  --   --   --   --   --   --  19*  --   --  20*  GLUCOSE 189*   < > 200*  --  201*  --  153*  --   --   --   --   --   --  358*  --   --  318*  BUN 58*   < > 58*  --  60*  --  65*  --   --   --   --   --   --  81*  --   --  102*  CREATININE 2.00*   < > 2.10*  --  2.08*  --  2.14*  --   --   --   --   --   --  2.72*  --   --  3.33*  CALCIUM 8.1*   < > 8.3*  --  7.7*  --  7.7*  --   --   --   --   --   --  7.6*  --   --  7.6*  MG 2.3  --   --   --   --   --   --   --  2.1  --   2.1  --   --  2.2  --  2.2  --   PHOS 5.9*  --   --   --   --   --   --   --  4.2  --  4.4  --   --  4.1  --  4.2  --    < > = values in this interval not displayed.   GFR: Estimated Creatinine Clearance: 18.7 mL/min (A) (by C-G formula based on SCr of 3.33 mg/dL (H)). Recent Labs  Lab 08/01/2019 1323  07/14/19 0835  07/16/19 0608 07/17/19 0511 07/17/19 2010 07/17/19 2309 07/18/19 0456 07/19/19 0420  PROCALCITON 0.45  --   --   --   --   --   --   --   --   --   WBC 12.1*   < >  --    < > 13.6* 17.4*  --   --  29.0* 24.3*  LATICACIDVEN  --   --  1.6  --   --   --  1.7 1.9  --   --    < > =  values in this interval not displayed.    Liver Function Tests: Recent Labs  Lab 07/30/2019 2125 07/14/19 0427 07/18/19 0456 07/19/19 0420  AST 47* 39 80* 135*  ALT 40 30 161* 218*  ALKPHOS 73 52 64 85  BILITOT 0.9 0.5 0.7 1.1  PROT 7.1 5.8* 5.0* 5.0*  ALBUMIN 3.1* 2.8* 2.2* 1.9*   No results for input(s): LIPASE, AMYLASE in the last 168 hours. No results for input(s): AMMONIA in the last 168 hours.  ABG    Component Value Date/Time   PHART 7.218 (L) 07/18/2019 1106   PCO2ART 44.6 07/18/2019 1106   PO2ART 79.0 (L) 07/18/2019 1106   HCO3 18.3 (L) 07/18/2019 1106   TCO2 20 (L) 07/18/2019 1106   ACIDBASEDEF 9.0 (H) 07/18/2019 1106   O2SAT 54.3 07/19/2019 0420     Coagulation Profile: Recent Labs  Lab 07/14/19 0427  INR 1.2    Cardiac Enzymes: No results for input(s): CKTOTAL, CKMB, CKMBINDEX, TROPONINI in the last 168 hours.  HbA1C: Hemoglobin A1C  Date/Time Value Ref Range Status  05/30/2019 02:01 PM 5.1 4.0 - 5.6 % Final   Hgb A1c MFr Bld  Date/Time Value Ref Range Status  01/11/2019 03:27 PM 6.8 (H) 4.6 - 6.5 % Final    Comment:    Glycemic Control Guidelines for People with Diabetes:Non Diabetic:  <6%Goal of Therapy: <7%Additional Action Suggested:  >8%   09/14/2018 03:00 PM 5.7 4.6 - 6.5 % Final    Comment:    Glycemic Control Guidelines for People with  Diabetes:Non Diabetic:  <6%Goal of Therapy: <7%Additional Action Suggested:  >8%     CBG: Recent Labs  Lab 07/18/19 1508 07/18/19 2059 07/18/19 2327 07/19/19 0427 07/19/19 0743  GLUCAP 243* 271* 263* 285* 250*    Critical care time:   The patient is critically ill with multiple organ systems failure and requires high complexity decision making for assessment and support, frequent evaluation and titration of therapies, application of advanced monitoring technologies and extensive interpretation of multiple databases.   Critical Care Time devoted to patient care services described in this note is 79 minutes. This time reflects the time of my personal involvement. This critical care time does not reflect separately billable procedures or procedure time, teaching time or supervisory time of PA/NP/Med student/Med Resident etc but could involve care discussion time.  [facility] 07/19/2019 8:20 AM  Pager: [phone] After hours pager: [phone]

## 2019-07-19 NOTE — Progress Notes (Addendum)
Advanced Heart Failure Rounding Note  PCP-Cardiologist: No primary care provider on file.   Subjective:    On Epi 5 mcg, vasopressin 0.0.3 units, norepi 40 mcg.   Remains intubated FiO2 40%.   Swan #s CVP 15  PA 65/39  PAPi 1.7  SVR 1149  PVR 10  CO 3.5  CI 1.8    Objective:   Weight Range: 89.4 kg Body mass index is 34.91 kg/m.   Vital Signs:   Temp:  [97.5 F (36.4 C)-101.5 F (38.6 C)] 100.8 F (38.2 C) (12/08 0700) Pulse Rate:  [102-119] 105 (12/08 0820) Resp:  [9-30] 26 (12/08 0820) BP: (100-140)/(47-98) 112/48 (12/08 0820) SpO2:  [95 %-100 %] 97 % (12/08 0827) Arterial Line BP: (75-133)/(40-57) 119/56 (12/08 0700) FiO2 (%):  [60 %-100 %] 60 % (12/08 0827) Weight:  [89.4 kg] 89.4 kg (12/08 0500) Last BM Date: 07/14/2019  Weight change: Filed Weights   08/03/2019 1937 07/18/19 0500 07/19/19 0500  Weight: 78.5 kg 90.3 kg 89.4 kg    Intake/Output:   Intake/Output Summary (Last 24 hours) at 07/19/2019 0848 Last data filed at 07/19/2019 0800 Gross per 24 hour  Intake 2941.09 ml  Output 235 ml  Net 2706.09 ml      Physical Exam   CVP 15  General:  Intubated/ Sedated  HEENT: ETT  OG tube with tube feeds being suctioned. Neck: Supple. JVP jaw . Carotids 2+ bilat; no bruits. No lymphadenopathy or thyromegaly appreciated. RIJ swan . C Collar  Cor: PMI nondisplaced. Regular rate & rhythm. No rubs, gallops or murmurs. Lungs: Coarse throughout Abdomen: Soft, nontender, nondistended. No hepatosplenomegaly. No bruits or masses. Good bowel sounds. Extremities: No cyanosis, clubbing, rash, R and LLE 3+ edema Neuro: Intubated/Sedated .  GU: Foley yellow urine.    Telemetry   ST 100s   EKG    N/a   Labs    CBC Recent Labs    07/18/19 0456 07/18/19 1106 07/19/19 0420  WBC 29.0*  --  24.3*  HGB 10.3* 9.2* 9.4*  HCT 33.3* 27.0* 31.6*  MCV 103.4*  --  105.3*  PLT 121*  --  88*   Basic Metabolic Panel Recent Labs    07/18/19 0456  07/18/19 1106 07/18/19 1602 07/19/19 0420  NA 148* 147*  --  148*  K 4.6 4.3  --  4.6  CL 119*  --   --  115*  CO2 19*  --   --  20*  GLUCOSE 358*  --   --  318*  BUN 81*  --   --  102*  CREATININE 2.72*  --   --  3.33*  CALCIUM 7.6*  --   --  7.6*  MG 2.2  --  2.2  --   PHOS 4.1  --  4.2  --    Liver Function Tests Recent Labs    07/18/19 0456 07/19/19 0420  AST 80* 135*  ALT 161* 218*  ALKPHOS 64 85  BILITOT 0.7 1.1  PROT 5.0* 5.0*  ALBUMIN 2.2* 1.9*   No results for input(s): LIPASE, AMYLASE in the last 72 hours. Cardiac Enzymes No results for input(s): CKTOTAL, CKMB, CKMBINDEX, TROPONINI in the last 72 hours.  BNP: BNP (last 3 results) No results for input(s): BNP in the last 8760 hours.  ProBNP (last 3 results) No results for input(s): PROBNP in the last 8760 hours.   D-Dimer No results for input(s): DDIMER in the last 72 hours. Hemoglobin A1C No results for input(s):  HGBA1C in the last 72 hours. Fasting Lipid Panel Recent Labs    07/17/19 0511  TRIG 96   Thyroid Function Tests No results for input(s): TSH, T4TOTAL, T3FREE, THYROIDAB in the last 72 hours.  Invalid input(s): FREET3  Other results:   Imaging    Dg Chest Port 1 View  Result Date: 07/19/2019 CLINICAL DATA:  Evaluate ETT. EXAM: PORTABLE CHEST 1 VIEW COMPARISON:  July 18, 2019 FINDINGS: The ETT, right PICC line, and PA catheter stone or an NG tube terminates below today's film. No pneumothorax. Bilateral pleural effusions with underlying opacities, similar in the interval. Stable cardiomediastinal silhouette. No other interval changes. IMPRESSION: 1. Stable support apparatus. 2. Bilateral pleural effusions with underlying opacities are stable. No other interval change. 3. No pneumothorax. Electronically Signed   By: Dorise Bullion III M.D   On: 07/19/2019 08:43   Dg Chest Port 1 View  Result Date: 07/18/2019 CLINICAL DATA:  Central line placement EXAM: PORTABLE CHEST 1 VIEW  COMPARISON:  07/18/2019 FINDINGS: The endotracheal tube terminates approximately 5.7 cm above the carina. The Swan-Ganz catheter tip projects over the right lower lobe pulmonary artery. The right-sided PICC line appears to terminate near the cavoatrial junction. The heart size is unchanged from prior study. There is no pneumothorax. Small bilateral pleural effusions are again noted. Bibasilar airspace disease is noted favored to represent atelectasis with consolidation not excluded. IMPRESSION: 1. Lines and tubes as above.  No pneumothorax. 2. Persistent bilateral pleural effusions. 3. Bibasilar airspace disease favored to represent atelectasis with an infiltrate not excluded. Electronically Signed   By: Constance Holster M.D.   On: 07/18/2019 19:57      Medications:     Scheduled Medications:  arformoterol  15 mcg Nebulization BID   atorvastatin  10 mg Per Tube Daily   budesonide (PULMICORT) nebulizer solution  0.25 mg Nebulization BID   calcium carbonate  1 tablet Per Tube Daily   chlorhexidine gluconate (MEDLINE KIT)  15 mL Mouth Rinse BID   Chlorhexidine Gluconate Cloth  6 each Topical Daily   cholecalciferol  5,000 Units Per Tube Daily   clonazepam  0.25 mg Per Tube BID   docusate  100 mg Per Tube BID   dorzolamide  1 drop Both Eyes [id number]   feeding supplement (PRO-STAT SUGAR FREE 64)  30 mL Per Tube BID   free water  100 mL Per Tube Q8H   hydrocortisone sod succinate (SOLU-CORTEF) inj  50 mg Intravenous Q6H   insulin aspart  0-20 Units Subcutaneous Q4H   insulin aspart  5 Units Subcutaneous Q4H   insulin detemir  15 Units Subcutaneous BID   ipratropium  0.5 mg Nebulization Q6H   ketorolac  1 drop Both Eyes TID   latanoprost  1 drop Both Eyes QHS   mouth rinse  15 mL Mouth Rinse 10 times per day   midazolam  1 mg Intravenous Once   pantoprazole sodium  40 mg Per Tube Daily   polyvinyl alcohol  1 drop Both Eyes Q4H   senna  1 tablet Per Tube BID   sodium  chloride flush  10-40 mL Intracatheter Q12H     Infusions:  sodium chloride Stopped (07/18/19 1257)   amiodarone 30 mg/hr (07/19/19 0800)   epinephrine     feeding supplement (VITAL AF 1.2 CAL) Stopped (07/19/19 0655)   fentaNYL infusion INTRAVENOUS 25 mcg/hr (07/19/19 0225)   furosemide     furosemide (LASIX) infusion     norepinephrine (LEVOPHED) Adult infusion 40  mcg/min (07/19/19 0800)   piperacillin-tazobactam (ZOSYN)  IV 12.5 mL/hr at 07/19/19 0800   vancomycin     vasopressin (PITRESSIN) infusion - *FOR SHOCK* 0.03 Units/min (07/19/19 0800)     PRN Medications:  Place/Maintain arterial line **AND** sodium chloride, acetaminophen **OR** acetaminophen, bisacodyl, fentaNYL, [DISCONTINUED] ondansetron **OR** ondansetron (ZOFRAN) IV, polyvinyl alcohol, senna-docusate, sodium chloride, sodium chloride flush, sodium phosphate    Assessment/Plan   1. Fall - Central Cord Compression  T12-L1 Fracture S/P Stabilization Thoracolumbar Fracture 12/4 Per Neurosurgery   2. Shock  ? Neurogenic versus Cardiogenis Shock  Blood CX - NGTD  Concern for R sided failure.  3. Acute on chronic hypoxic respiratory railure  Extubated but required reintubation 07/17/19 CCM managing.   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 ? PE Marked volume overload. CVP 15.  - Started lasix drip per CCM.  - Will try to diurese as BP permits.  - Check CO-OX   5. Pulmonary Hypertension  Elevated PA pressures on ECHO.   6. A Fib RVR Placed on amiodarone drip with conversion to Sinus Tach  - Continue amiodarone drip.   7. AKI on CKD Stage IIIb  Creatinine baseline 1.8.  Creatinine continues to rise 2.7>3.3.  Poor urine output.    Length of Stay: [place], NP  07/19/2019, 8:48 AM  Advanced Heart Failure Team Pager [phone] (M-F; 7a - 4p)  Please contact [facility] for night-coverage after hours (4p -7a ) and weekends on [email or web address]  Agree with above.   He remains  critically ill. Awake but lethargic on vent. Swan placed yesterday. Now on epi, NE and VP. Cardiac index remains marginal with evidence of cor pulmonale. SVR improved with VP. He is markedly volume overloaded. Creatinine worse. Now on broad spectrum abx. Cx remain negative. LE u/s negative for DVT  Obese male awake on vent but lethargic HEENT: normal + ETT Neck: in c-collar. RIJ swan  CVP to ear Cor: PMI nondisplaced. Regular tachy.  RV lit. No rubs, gallops or murmurs. Lungs: coars Abdomen: obese soft, nontender, + distended. No hepatosplenomegaly. No bruits or masses. Good bowel sounds. Extremities: no cyanosis, clubbing, rash, 3-4+ edemaedema Neuro: awake on vent but lethargic  He remains critically ill with a mix of cardiogenic (cor pulmonale) shock and distributive shock due to neurogenic +/- septic etiology. Markedly volume overloaded. Renal function worse.   Have added inhaled NO and increased lasix gtt to 15. Discussed situation with son and nephew. May be headed toward [facility].   CRITICAL CARE Performed by: Glori Bickers  Total critical care time: 45 minutes  Critical care time was exclusive of separately billable procedures and treating other patients.  Critical care was necessary to treat or prevent imminent or life-threatening deterioration.  Critical care was time spent personally by me (independent of midlevel providers or residents) on the following activities: development of treatment plan with patient and/or surrogate as well as nursing, discussions with consultants, evaluation of patient's response to treatment, examination of patient, obtaining history from patient or surrogate, ordering and performing treatments and interventions, ordering and review of laboratory studies, ordering and review of radiographic studies, pulse oximetry and re-evaluation of patient's condition.  Glori Bickers, MD  10:00 PM

## 2019-07-19 NOTE — Progress Notes (Addendum)
Patient oozing from surgical site on back. The dressing was re-enforced and the MD was notified. Awaiting return page. VSS. Moving all extremities and following commands. Will continue to monitor.    1104- Neurosurgery paged again.    1110- Spoke with neurosurgery. RN instructed to continue to re-enforce dressing and monitor surgical site.

## 2019-07-19 NOTE — Progress Notes (Signed)
Pharmacy Antibiotic Note  Lucas Price is a 78 y.o. male admitted on 07/17/2019 s/p fall.  Pharmacy had been consulted for empiric vancomycin dosing 12/6 patient now febrile 101 WBC 29 - on HCT for stress dose steroids in setting of hypotension - neurogenic/spetic shock. Cr is rising, pt remains febrile.   Plan: Reduce Zosyn to 2.25g IV q8h Hold vancomycin for now - check random in the morning   Height: 5\' 3"  (160 cm) Weight: 197 lb 1.5 oz (89.4 kg) IBW/kg (Calculated) : 56.9  Temp (24hrs), Avg:101 F (38.3 C), Min:97.5 F (36.4 C), Max:101.5 F (38.6 C)  Recent Labs  Lab 07/14/19 0835  07/18/2019 2000 07/16/19 0608 07/17/19 0511 07/17/19 2010 07/17/19 2309 07/18/19 0456 07/19/19 0420  WBC  --    < > 20.7* 13.6* 17.4*  --   --  29.0* 24.3*  CREATININE 2.00*   < > 2.10* 2.08* 2.14*  --   --  2.72* 3.33*  LATICACIDVEN 1.6  --   --   --   --  1.7 1.9  --   --    < > = values in this interval not displayed.    Estimated Creatinine Clearance: 18.7 mL/min (A) (by C-G formula based on SCr of 3.33 mg/dL (H)).    No Known Allergies  Antimicrobials this admission: CTX 12/2 >>12/7 Azith 12/3 >> 12/7 Vanc 12/6 >> Zosyn 12/8>   Microbiology results: 12/1 covid - negative 12/2 MRSA PCR - negative 12/4 surgical screen - negative 12/7 Bcx: NGTD   Arrie Senate, PharmD, BCPS Clinical Pharmacist [phone] Please check AMION for all [facility] Pharmacy numbers 07/19/2019

## 2019-07-20 ENCOUNTER — Inpatient Hospital Stay (HOSPITAL_COMMUNITY): Payer: Medicare Other

## 2019-07-20 DIAGNOSIS — S22089A Unspecified fracture of T11-T12 vertebra, initial encounter for closed fracture: Secondary | ICD-10-CM

## 2019-07-20 DIAGNOSIS — J410 Simple chronic bronchitis: Secondary | ICD-10-CM | POA: Diagnosis not present

## 2019-07-20 DIAGNOSIS — J9601 Acute respiratory failure with hypoxia: Secondary | ICD-10-CM | POA: Diagnosis not present

## 2019-07-20 DIAGNOSIS — R739 Hyperglycemia, unspecified: Secondary | ICD-10-CM

## 2019-07-20 DIAGNOSIS — I2510 Atherosclerotic heart disease of native coronary artery without angina pectoris: Secondary | ICD-10-CM | POA: Diagnosis not present

## 2019-07-20 DIAGNOSIS — S32019A Unspecified fracture of first lumbar vertebra, initial encounter for closed fracture: Secondary | ICD-10-CM

## 2019-07-20 DIAGNOSIS — L8990 Pressure ulcer of unspecified site, unspecified stage: Secondary | ICD-10-CM | POA: Insufficient documentation

## 2019-07-20 DIAGNOSIS — R570 Cardiogenic shock: Secondary | ICD-10-CM | POA: Diagnosis not present

## 2019-07-20 DIAGNOSIS — Q069 Congenital malformation of spinal cord, unspecified: Secondary | ICD-10-CM

## 2019-07-20 LAB — BASIC METABOLIC PANEL
Anion gap: 16 — ABNORMAL HIGH (ref 5–15)
BUN: 118 mg/dL — ABNORMAL HIGH (ref 8–23)
CO2: 19 mmol/L — ABNORMAL LOW (ref 22–32)
Calcium: 7.7 mg/dL — ABNORMAL LOW (ref 8.9–10.3)
Chloride: 113 mmol/L — ABNORMAL HIGH (ref 98–111)
Creatinine, Ser: 3.72 mg/dL — ABNORMAL HIGH (ref 0.61–1.24)
GFR calc Af Amer: 17 mL/min — ABNORMAL LOW (ref >60)
GFR calc non Af Amer: 15 mL/min — ABNORMAL LOW (ref >60)
Glucose, Bld: 214 mg/dL — ABNORMAL HIGH (ref 70–99)
Potassium: 4.2 mmol/L (ref 3.5–5.1)
Sodium: 148 mmol/L — ABNORMAL HIGH (ref 135–145)

## 2019-07-20 LAB — GLUCOSE, CAPILLARY
Glucose-Capillary: 158 mg/dL — ABNORMAL HIGH (ref 70–99)
Glucose-Capillary: 173 mg/dL — ABNORMAL HIGH (ref 70–99)
Glucose-Capillary: 178 mg/dL — ABNORMAL HIGH (ref 70–99)
Glucose-Capillary: 187 mg/dL — ABNORMAL HIGH (ref 70–99)
Glucose-Capillary: 194 mg/dL — ABNORMAL HIGH (ref 70–99)
Glucose-Capillary: 201 mg/dL — ABNORMAL HIGH (ref 70–99)
Glucose-Capillary: 208 mg/dL — ABNORMAL HIGH (ref 70–99)

## 2019-07-20 LAB — VANCOMYCIN, RANDOM: Vancomycin Rm: 11

## 2019-07-20 MED ORDER — HYDROCORTISONE NA SUCCINATE PF 100 MG IJ SOLR
50.0000 mg | Freq: Every day | INTRAMUSCULAR | Status: AC
  Administered 2019-07-21: 50 mg via INTRAVENOUS
  Filled 2019-07-20: qty 2

## 2019-07-20 MED ORDER — HYDROCORTISONE NA SUCCINATE PF 100 MG IJ SOLR
50.0000 mg | Freq: Two times a day (BID) | INTRAMUSCULAR | Status: AC
  Administered 2019-07-20: 50 mg via INTRAVENOUS
  Filled 2019-07-20: qty 2

## 2019-07-20 MED ORDER — SODIUM CHLORIDE 0.9 % IV BOLUS: 500.0000 mL | Freq: Once | INTRAVENOUS | Status: DC

## 2019-07-20 MED ORDER — VANCOMYCIN HCL IN DEXTROSE 750-5 MG/150ML-% IV SOLN
750.0000 mg | INTRAVENOUS | Status: DC
  Administered 2019-07-20: 750 mg via INTRAVENOUS
  Filled 2019-07-20 (×2): qty 150

## 2019-07-20 NOTE — Progress Notes (Signed)
Discussed CT  With RT   Will have to have 2 RT down with Nitric. Will have to coordinate with other RT and CT scan.

## 2019-07-20 NOTE — Progress Notes (Signed)
Pt transported from [place] to CT and back without complications.

## 2019-07-20 NOTE — Progress Notes (Addendum)
eLink Physician-Brief Progress Note Patient Name: Lucas Price DOB: October 28, 1940 MRN: [id number]   Date of Service  07/20/2019  HPI/Events of Note  hypotension  eICU Interventions  Normal saline 500 ml iv bolus x 1 Blood pressure recovered spontaneously so bolus was discontinued.        Kerry Kass Malay Fantroy 07/20/2019, 6:10 AM

## 2019-07-20 NOTE — Progress Notes (Signed)
[place] notified that pt's blood pressure decreased significantly after wedging and rezeroing lines. Pt's MAP decreased from 70s to 30s. 500 CC bolus was started but stopped within minutes due to pt's blood pressure steadily increasing. Gtts weaned as needed. Will continue to monitor.

## 2019-07-20 NOTE — Progress Notes (Addendum)
Advanced Heart Failure Rounding Note  PCP-Cardiologist: No primary care provider on file.   Subjective:    On Epi 5 mcg, vasopressin 0.03 units, norepi 33 mcg. Diuresing with lasix drip at 15 mg per hour. On iNO 15 ppm.    Remains intubated FiO2 40% PEEP 10   Over night stopped moving lower extremities.   Swan #s CVP 12  PA 65/33 (45) PCWP 10   SVR 954 PVR 8 CO 4.4 CI 2.3    Objective:   Weight Range: 95.5 kg Body mass index is 37.3 kg/m.   Vital Signs:   Temp:  [97.9 F (36.6 C)-100.4 F (38 C)] 98.4 F (36.9 C) (12/09 0700) Pulse Rate:  [81-106] 89 (12/09 0811) Resp:  [22-27] 26 (12/09 0811) BP: (86-146)/(49-74) 143/53 (12/09 0811) SpO2:  [94 %-100 %] 100 % (12/09 0824) Arterial Line BP: (106-167)/(41-61) 167/58 (12/09 0700) FiO2 (%):  [40 %] 40 % (12/09 0824) Weight:  [95.5 kg] 95.5 kg (12/09 0500) Last BM Date: 07/17/2019  Weight change: Filed Weights   07/18/19 0500 07/19/19 0500 07/20/19 0500  Weight: 90.3 kg 89.4 kg 95.5 kg    Intake/Output:   Intake/Output Summary (Last 24 hours) at 07/20/2019 0843 Last data filed at 07/20/2019 0800 Gross per 24 hour  Intake 2799.92 ml  Output 1230 ml  Net 1569.92 ml      Physical Exam   CVP 12 General:  On vent.   HEENT: ETT C- collar.  Neck: supple. JVP to ear.  Carotids 2+ bilat; no bruits. No lymphadenopathy or thryomegaly appreciated. Cor: PMI nondisplaced. Regular rate & rhythm. No rubs, gallops or murmurs. Lungs: Coarse throughout.  Abdomen: soft, nontender, nondistended. No hepatosplenomegaly. No bruits or masses. Good bowel sounds. Extremities: no cyanosis, clubbing, rash, R and LLE 2-3+ edema. RIJ swan  Neuro:On vent. Moving  upper extremities.    Telemetry   ST 100s   EKG    N/a   Labs    CBC Recent Labs    07/18/19 0456 07/18/19 1106 07/19/19 0420  WBC 29.0*  --  24.3*  HGB 10.3* 9.2* 9.4*  HCT 33.3* 27.0* 31.6*  MCV 103.4*  --  105.3*  PLT 121*  --  88*   Basic  Metabolic Panel Recent Labs    07/18/19 0456  07/18/19 1602  07/19/19 1639 07/20/19 0508  NA 148*   < >  --    < > 148* 148*  K 4.6   < >  --    < > 4.1 4.2  CL 119*  --   --    < > 118* 113*  CO2 19*  --   --    < > 19* 19*  GLUCOSE 358*  --   --    < > 215* 214*  BUN 81*  --   --    < > 116* 118*  CREATININE 2.72*  --   --    < > 3.59* 3.72*  CALCIUM 7.6*  --   --    < > 7.7* 7.7*  MG 2.2  --  2.2  --   --   --   PHOS 4.1  --  4.2  --   --   --    < > = values in this interval not displayed.   Liver Function Tests Recent Labs    07/18/19 0456 07/19/19 0420  AST 80* 135*  ALT 161* 218*  ALKPHOS 64 85  BILITOT 0.7 1.1  PROT  5.0* 5.0*  ALBUMIN 2.2* 1.9*   No results for input(s): LIPASE, AMYLASE in the last 72 hours. Cardiac Enzymes No results for input(s): CKTOTAL, CKMB, CKMBINDEX, TROPONINI in the last 72 hours.  BNP: BNP (last 3 results) No results for input(s): BNP in the last 8760 hours.  ProBNP (last 3 results) No results for input(s): PROBNP in the last 8760 hours.   D-Dimer No results for input(s): DDIMER in the last 72 hours. Hemoglobin A1C No results for input(s): HGBA1C in the last 72 hours. Fasting Lipid Panel No results for input(s): CHOL, HDL, LDLCALC, TRIG, CHOLHDL, LDLDIRECT in the last 72 hours. Thyroid Function Tests No results for input(s): TSH, T4TOTAL, T3FREE, THYROIDAB in the last 72 hours.  Invalid input(s): FREET3  Other results:   Imaging    Vas [place] Lower Extremity Venous (dvt)  Result Date: 07/19/2019  Lower Venous Study Indications: RV failure.  Limitations: Body habitus, poor ultrasound/tissue interface, bandages and restricted mobility, ventilated, bright ICU room. Comparison Study: No prior study. Performing Technologist: Maudry Mayhew MHA, RDMS, RVT, RDCS  Examination Guidelines: A complete evaluation includes B-mode imaging, spectral Doppler, color Doppler, and power Doppler as needed of all accessible portions of each  vessel. Bilateral testing is considered an integral part of a complete examination. Limited examinations for reoccurring indications may be performed as noted.  +---------+---------------+---------+-----------+----------+--------------+ RIGHT    CompressibilityPhasicitySpontaneityPropertiesThrombus Aging +---------+---------------+---------+-----------+----------+--------------+ CFV                                                   Not visualized +---------+---------------+---------+-----------+----------+--------------+ SFJ                                                   Not visualized +---------+---------------+---------+-----------+----------+--------------+ FV Prox  Full                                                        +---------+---------------+---------+-----------+----------+--------------+ FV Mid   Full                                                        +---------+---------------+---------+-----------+----------+--------------+ FV Distal                                             Not visualized +---------+---------------+---------+-----------+----------+--------------+ PFV      Full                                                        +---------+---------------+---------+-----------+----------+--------------+ POP      Full           No  Yes                                 +---------+---------------+---------+-----------+----------+--------------+ PTV      Full                                                        +---------+---------------+---------+-----------+----------+--------------+ PERO                                                  Not visualized +---------+---------------+---------+-----------+----------+--------------+   +---------+---------------+---------+-----------+----------+--------------+ LEFT     CompressibilityPhasicitySpontaneityPropertiesThrombus Aging  +---------+---------------+---------+-----------+----------+--------------+ CFV      Full           No       Yes                                 +---------+---------------+---------+-----------+----------+--------------+ SFJ      Full                                                        +---------+---------------+---------+-----------+----------+--------------+ FV Prox  Full                                                        +---------+---------------+---------+-----------+----------+--------------+ FV Mid   Full                                                        +---------+---------------+---------+-----------+----------+--------------+ FV DistalFull                                                        +---------+---------------+---------+-----------+----------+--------------+ PFV                                                   Not visualized +---------+---------------+---------+-----------+----------+--------------+ POP      Full                                                        +---------+---------------+---------+-----------+----------+--------------+ PTV  Not visualized +---------+---------------+---------+-----------+----------+--------------+ PERO                                                  Not visualized +---------+---------------+---------+-----------+----------+--------------+ Unable to evaluate left popliteal vein in long due to patient position.  Summary: Right: There is no evidence of deep vein thrombosis in the lower extremity. However, portions of this examination were limited- see technologist comments above. No cystic structure found in the popliteal fossa. Left: There is no evidence of deep vein thrombosis in the lower extremity. However, portions of this examination were limited- see technologist comments above. No cystic structure found in the popliteal fossa.   *See table(s) above for measurements and observations. Electronically signed by Harold Barban MD on 07/19/2019 at 5:23:03 PM.    Final      Medications:     Scheduled Medications: . arformoterol  15 mcg Nebulization BID  . atorvastatin  10 mg Per Tube Daily  . budesonide (PULMICORT) nebulizer solution  0.25 mg Nebulization BID  . calcium carbonate  1 tablet Per Tube Daily  . chlorhexidine gluconate (MEDLINE KIT)  15 mL Mouth Rinse BID  . Chlorhexidine Gluconate Cloth  6 each Topical Daily  . cholecalciferol  5,000 Units Per Tube Daily  . clonazepam  0.25 mg Per Tube BID  . docusate  100 mg Per Tube BID  . dorzolamide  1 drop Both Eyes BH-q7a  . feeding supplement (PRO-STAT SUGAR FREE 64)  30 mL Per Tube BID  . free water  100 mL Per Tube Q8H  . hydrocortisone sod succinate (SOLU-CORTEF) inj  50 mg Intravenous Q6H  . insulin aspart  0-20 Units Subcutaneous Q4H  . insulin aspart  5 Units Subcutaneous Q4H  . insulin detemir  20 Units Subcutaneous BID  . ipratropium  0.5 mg Nebulization Q6H  . ketorolac  1 drop Both Eyes TID  . latanoprost  1 drop Both Eyes QHS  . mouth rinse  15 mL Mouth Rinse 10 times per day  . midazolam  1 mg Intravenous Once  . pantoprazole sodium  40 mg Per Tube Daily  . polyethylene glycol  17 g Per Tube Daily  . polyvinyl alcohol  1 drop Both Eyes Q4H  . sodium chloride flush  10-40 mL Intracatheter Q12H    Infusions: . sodium chloride Stopped (07/18/19 1257)  . amiodarone 30 mg/hr (07/20/19 0800)  . epinephrine 5 mcg/min (07/20/19 0800)  . feeding supplement (VITAL AF 1.2 CAL) 30 mL/hr at 07/19/19 1600  . fentaNYL infusion INTRAVENOUS 25 mcg/hr (07/19/19 1208)  . furosemide (LASIX) infusion 15 mg/hr (07/20/19 0800)  . norepinephrine (LEVOPHED) Adult infusion 33 mcg/min (07/20/19 0800)  . piperacillin-tazobactam (ZOSYN)  IV Stopped (07/19/19 2207)  . vancomycin    . vasopressin (PITRESSIN) infusion - *FOR SHOCK* 0.03 Units/min (07/20/19 0800)    PRN  Medications: Place/Maintain arterial line **AND** sodium chloride, acetaminophen **OR** acetaminophen, bisacodyl, fentaNYL, [DISCONTINUED] ondansetron **OR** ondansetron (ZOFRAN) IV, polyvinyl alcohol, sodium chloride, sodium chloride flush    Assessment/Plan   1. Fall - Central Cord Compression  T12-L1 Fracture S/P Stabilization Thoracolumbar Fracture 12/4 - Sending for CT . Motor deficit lower extremities.  Per Neurosurgery   2. Shock  ? Neurogenic versus Cardiogenis Shock  Blood CX - NGTD  Concern for R sided failure.  3. Acute on chronic hypoxic respiratory railure  Extubated but  required reintubation 07/17/19 CCM managing.   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 ? PE Marked volume overload. CVP 11-12  - Increase lasix drip to 30 mg per hour.  - Check CO-OX   5. Pulmonary Hypertension  PVR 8. On iNO 15 ppm  Elevated PA pressures on ECHO.    6. A Fib RVR Placed on amiodarone drip with conversion to Sinus Tach  - Continue amiodarone drip.   7. AKI on CKD Stage IIIb  Creatinine baseline 1.8.  Creatinine continues to rise 2.7>3.3> 3.7  . BUN 118 Poor urine output.   8. Hypernatremia  Sodium 148 .    Length of Stay: [place], NP  07/20/2019, 8:43 AM  Advanced Heart Failure Team Pager [phone] (M-F; 7a - 4p)  Please contact [facility] for night-coverage after hours (4p -7a ) and weekends on [email or web address]  Agree with above  Remains on high dose pressors including NE 33, EPI 5 and VP. Also on iNO. Swan numbers much improved but urine output poor and creatinine a bit worse.  Remains on vent. Overnight seems to have lost all motor strenght in lower extremities. Unclear if progressive neuro injury or due to overall illness  On exam General:  Critically ill appearing. On vent HEENT: + ETT Neck: + c-collar RIJ swan  Cor: Regular tachy + RV lift Lungs: coarse Abdomen: distended hypoactive BS Extremities: 3-4+ edema  Neuro: minimally responsive on vent LE  seem flaccid  Continues to deteriorate. Will keep pressors and iNO and current level. Increase lasix. D/w CCM who discussed with NS[facility]. Plan for emergent CT to assess for spinal cord hematoma.   Will discuss GOC with family.   CRITICAL CARE Performed by: Glori Bickers  Total critical care time: 45 minutes  Critical care time was exclusive of separately billable procedures and treating other patients.  Critical care was necessary to treat or prevent imminent or life-threatening deterioration.  Critical care was time spent personally by me (independent of midlevel providers or residents) on the following activities: development of treatment plan with patient and/or surrogate as well as nursing, discussions with consultants, evaluation of patient's response to treatment, examination of patient, obtaining history from patient or surrogate, ordering and performing treatments and interventions, ordering and review of laboratory studies, ordering and review of radiographic studies, pulse oximetry and re-evaluation of patient's condition.   Glori Bickers, MD  9:04 AM

## 2019-07-20 NOTE — Progress Notes (Signed)
Pharmacy Antibiotic Note  Lucas Price is a 78 y.o. male admitted on 07/23/2019 s/p fall.  Pharmacy had been consulted for empiric vancomycin dosing 12/6 patient now febrile 101 WBC 29 - on HCT for stress dose steroids in setting of hypotension - neurogenic/spetic shock. Cr is rising, pt remains intermittantly febrile. Vancomycin random this morning is 11 mcg/ml.  Plan: Continue Zosyn to 2.25g IV q8h Vancomyicn 750mg  IV q48h - est AUC 501   Height: 5\' 3"  (160 cm) Weight: 210 lb 8.6 oz (95.5 kg) IBW/kg (Calculated) : 56.9  Temp (24hrs), Avg:99.3 F (37.4 C), Min:97.9 F (36.6 C), Max:100.4 F (38 C)  Recent Labs  Lab 07/14/19 0835  07/22/2019 2000 07/16/19 0608 07/17/19 0511 07/17/19 2010 07/17/19 2309 07/18/19 0456 07/19/19 0420 07/19/19 1639 07/20/19 0508  WBC  --    < > 20.7* 13.6* 17.4*  --   --  29.0* 24.3*  --   --   CREATININE 2.00*   < > 2.10* 2.08* 2.14*  --   --  2.72* 3.33* 3.59* 3.72*  LATICACIDVEN 1.6  --   --   --   --  1.7 1.9  --   --   --   --   VANCORANDOM  --   --   --   --   --   --   --   --   --   --  11   < > = values in this interval not displayed.    Estimated Creatinine Clearance: 17.3 mL/min (A) (by C-G formula based on SCr of 3.72 mg/dL (H)).    No Known Allergies  Antimicrobials this admission: CTX 12/2 >>12/7 Azith 12/3 >> 12/7 Vanc 12/6 >> Zosyn 12/8>   Microbiology results: 12/1 covid - negative 12/2 MRSA PCR - negative 12/4 surgical screen - negative 12/7 Bcx: NGTD   Arrie Senate, PharmD, BCPS Clinical Pharmacist [phone] Please check AMION for all [facility] Pharmacy numbers 07/20/2019

## 2019-07-20 NOTE — Progress Notes (Signed)
  NEUROSURGERY PROGRESS NOTE   Late entry - patient seen during am rounds. Remains intubated  EXAM:  BP (!) 143/53   Pulse 89   Temp 98.4 F (36.9 C)   Resp (!) 26   Ht 5\' 3"  (1.6 m)   Wt 95.5 kg   SpO2 98%   BMI 37.30 kg/m   Intubated Follows commands with BUE and BLE, L>R Cervical incision: c/d/i Lumbar incision: fresh blood on bandage, minimal oozing.  IMPRESSION/PLAN 78 y.o. male  POD #6 s/p C5-T1 ACDF for central cord syndrome, POD #4 T10-L3 fusion for unstable T12-L1 chance type fracture. Remains intubated. Stable neurologically. Management per PCCM and Cards.  Reinforce bandages as needed - No new NS recs. - Continue care per primary team

## 2019-07-20 NOTE — Progress Notes (Addendum)
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  He has a past medical history of insulin-dependent diabetes,  12/1 fall while climbing stairs, present to ED 12/2 found to have central cord syndrome, taken to OR for decompression.  12/3 Hypotensive, PCCM consult.  12/4 more hypoxic. Requiring HFO2 at 15 liters.  went back to OR  for Lumbar 3 fusion of unstable lumbar fracture. Returned to ICU on vent  12/5 weaning but lower Vts, upper extremity strength seems improved.  12/6: He remains on low-dose Neo-Synephrine, but also on fentanyl infusion.  Looks comfortable on spontaneous breathing trial and has acceptable frequency/tidal volume ratio for extubation so order has been written to extubate. Was extubated. Was initially awake. Alert. Following commands and in no distress. Then became suddenly unresponsive, acutely hypotensive and began to brady down. Was emergently re-intubated. Working dx at this point raising concern for sig cardiac component  12/7: Remains FiO2 100%, PEEP 10. Awaiting ECHO. Max on NE. transferred to to heart for PA catheter placement.   Consults:  Cardiology, heart failure Neurosurgery  Procedures:  PA catheter placed 12/7: On NE 50  CVP 14 PA 61/30 (44) PCWP 12 SVR 877 PVR 8.0 WU Thermo CO/CI = 4.0/2.1  Significant Diagnostic Tests:  Echocardiogram on 12/7 demonstrates elevated right atrial pressures, estimated RVSP is 102 mmHg, the RV apex is akinetic and the left ventricle systolic ejection fraction is 60 to 65%.  Micro Data:  Urinalysis on 12 7 with pyuria, bacteria present. Q aspirate 12/8 consistent with normal respiratory flora MRSA PCR on 12/4 is negative. Blood cultures 12/7 pending. COVID-19  negative on 12/1 Antimicrobials:  Currently on vancomycin, and piperacillin tazobactam.  Interim history/subjective:  Overnight developed some loss of sensation in his lower extremities.  He did not have much diuresis with the Lasix drip.  Objective   Blood pressure (!) 143/53, pulse 89, temperature 98.4 F (36.9 C), resp. rate (!) 26, height 5\' 3"  (1.6 m), weight 95.5 kg, SpO2 98 %. PAP: (60-85)/(26-40) 66/34 CVP:  [8 mmHg-15 mmHg] 13 mmHg PCWP:  [12 mmHg-15 mmHg] 12 mmHg CO:  [3.7 L/min-4.3 L/min] 4.3 L/min CI:  [1.9 L/min/m2-2.2 L/min/m2] 2.2 L/min/m2  Vent Mode: PRVC FiO2 (%):  [40 %-60 %] 40 % Set Rate:  [20 bmp-26 bmp] 26 bmp Vt Set:  [450 mL] 450 mL PEEP:  [10 cmH20] 10 cmH20 Plateau Pressure:  [23 cmH20-26 cmH20] 24 cmH20   Intake/Output Summary (Last 24 hours) at 07/20/2019 0817 Last data filed at 07/20/2019 0700 Gross per 24 hour  Intake 2706.4 ml  Output 1170 ml  Net 1536.4 ml   Filed Weights   07/18/19 0500 07/19/19 0500 07/20/19 0500  Weight: 90.3 kg 89.4 kg 95.5 kg    Examination: General: Debated, sedated, appears calm. HENT: sclera nonicteric Lungs: Diminished, bilaterally no wheezes or crackles no increased work of breathing. Cardiovascular: Tachycardiac, regular, murmur Abdomen: Distended, hypoactive bowel sounds, no tenderness to palpation Extremities: Diffuse bilateral upper and lower extremity pitting edema 2+ Neuro: Upper extremities, opens eyes MSK: No rashes Lines: Right PA catheter, sided PICC line, Foley catheter, OG tube, endotracheal tube, left radial arterial line   Assessment & Plan:  Is a 78 year old gentleman with insulin-dependent diabetes, coronary artery disease which has  not been intervened on, and CKD stage III who presents initially with central cord syndrome now found to have mixed shock and respiratory failure.  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  I discussed his case with Dr. Kathyrn Sheriff of neurosurgery.  Although the likelihood is low for hematoma, deficits are concerning.  We will proceed with MRI of the spine to evaluate further.  The patient did not respond to Lasix drip over the last 24 hours.  I do think that ultra filtration is in his future for volume removal.  He continues to maintain on vasopressin, norepinephrine, as well as low-dose epinephrine.  I think the vasopressin can probably be shut off.  Would favor norepinephrine as the vasoactive agent of choice for RV failure conjunction with volume removal.  Pending the results of his MRI and discussion with family, would need to start volume removal with consultation with nephrology.  Tapering stress dose steroids.   I think overall the patient has a very poor prognosis.  He has underlying acute on chronic cor pulmonale, with COPD, with coronary artery disease that has not been intervened on, with impending kidney failure acquiring dialysis, with type 2 diabetes mellitus with hyperglycemia, and of course his spinal fractures with new neurologic deficits.  I discussed these findings with neurosurgery.  We will continue to communicate with the family regarding goals of care.   Best practice:  Diet: Tube feeds are ordered.  Will be advanced to goal. Pain/Anxiety/Delirium protocol (if indicated): Fentanyl ordered VAP protocol (if indicated): VAP is ordered DVT prophylaxis: SCDs GI prophylaxis: PPI Glucose control: Not well controlled, will increase insulin today.   Foley: yes Mobility: Bedrest Code Status: Full code Family  Communication: Patient has a nephew who has been checking in.  Will update when able. Disposition: Needs ICU   Labs   Mixed venous on 12/8 morning was 54.3% distant with a low cardiac output state.  CBC: Recent Labs  Lab 08/03/2019 2000  07/16/19 0608  07/17/19 0511  07/17/19 2350 07/18/19 0346 07/18/19 0456 07/18/19 1106 07/19/19 0420  WBC 20.7*  --  13.6*  --  17.4*  --   --   --  29.0*  --  24.3*  HGB 9.9*   < > 9.8*   < > 10.5*   < > 10.2* 10.2* 10.3* 9.2* 9.4*  HCT 31.6*   < > 30.6*   < > 33.1*   < > 30.0* 30.0* 33.3* 27.0* 31.6*  MCV 101.9*  --  99.7  --  98.8  --   --   --  103.4*  --  105.3*  PLT 120*  --  108*  --  123*  --   --   --  121*  --  88*   < > = values in this interval not displayed.    Basic Metabolic Panel: Recent Labs  Lab 07/14/19 0427  07/17/19 0511  07/17/19 1337  07/17/19 1713  07/18/19 0456 07/18/19 1106 07/18/19 1602 07/19/19 0420 07/19/19 1639 07/20/19 0508  NA 139   < > 148*   < >  --    < >  --    < >  148* 147*  --  148* 148* 148*  K 5.4*   < > 4.2   < >  --    < >  --    < > 4.6 4.3  --  4.6 4.1 4.2  CL 107   < > 117*  --   --   --   --   --  119*  --   --  115* 118* 113*  CO2 22   < > 19*  --   --   --   --   --  19*  --   --  20* 19* 19*  GLUCOSE 189*   < > 153*  --   --   --   --   --  358*  --   --  318* 215* 214*  BUN 58*   < > 65*  --   --   --   --   --  81*  --   --  102* 116* 118*  CREATININE 2.00*   < > 2.14*  --   --   --   --   --  2.72*  --   --  3.33* 3.59* 3.72*  CALCIUM 8.1*   < > 7.7*  --   --   --   --   --  7.6*  --   --  7.6* 7.7* 7.7*  MG 2.3  --   --   --  2.1  --  2.1  --  2.2  --  2.2  --   --   --   PHOS 5.9*  --   --   --  4.2  --  4.4  --  4.1  --  4.2  --   --   --    < > = values in this interval not displayed.   GFR: Estimated Creatinine Clearance: 17.3 mL/min (A) (by C-G formula based on SCr of 3.72 mg/dL (H)). Recent Labs  Lab 07/18/2019 1323  07/14/19 0835  07/16/19 0608 07/17/19 0511 07/17/19  2010 07/17/19 2309 07/18/19 0456 07/19/19 0420  PROCALCITON 0.45  --   --   --   --   --   --   --   --   --   WBC 12.1*   < >  --    < > 13.6* 17.4*  --   --  29.0* 24.3*  LATICACIDVEN  --   --  1.6  --   --   --  1.7 1.9  --   --    < > = values in this interval not displayed.    Liver Function Tests: Recent Labs  Lab 07/14/19 0427 07/18/19 0456 07/19/19 0420  AST 39 80* 135*  ALT 30 161* 218*  ALKPHOS 52 64 85  BILITOT 0.5 0.7 1.1  PROT 5.8* 5.0* 5.0*  ALBUMIN 2.8* 2.2* 1.9*   No results for input(s): LIPASE, AMYLASE in the last 168 hours. No results for input(s): AMMONIA in the last 168 hours.  ABG    Component Value Date/Time   PHART 7.218 (L) 07/18/2019 1106   PCO2ART 44.6 07/18/2019 1106   PO2ART 79.0 (L) 07/18/2019 1106   HCO3 18.3 (L) 07/18/2019 1106   TCO2 20 (L) 07/18/2019 1106   ACIDBASEDEF 9.0 (H) 07/18/2019 1106   O2SAT 59.4 07/19/2019 1654     Coagulation Profile: Recent Labs  Lab 07/14/19 0427  INR 1.2    Cardiac Enzymes: No results for input(s):  CKTOTAL, CKMB, CKMBINDEX, TROPONINI in the last 168 hours.  HbA1C: Hemoglobin A1C  Date/Time Value Ref Range Status  05/30/2019 02:01 PM 5.1 4.0 - 5.6 % Final   Hgb A1c MFr Bld  Date/Time Value Ref Range Status  01/11/2019 03:27 PM 6.8 (H) 4.6 - 6.5 % Final    Comment:    Glycemic Control Guidelines for People with Diabetes:Non Diabetic:  <6%Goal of Therapy: <7%Additional Action Suggested:  >8%   09/14/2018 03:00 PM 5.7 4.6 - 6.5 % Final    Comment:    Glycemic Control Guidelines for People with Diabetes:Non Diabetic:  <6%Goal of Therapy: <7%Additional Action Suggested:  >8%     CBG: Recent Labs  Lab 07/19/19 1631 07/19/19 2028 07/20/19 0004 07/20/19 0516 07/20/19 0739  GLUCAP 192* 201* 208* 194* 178*    Critical care time:   The patient is critically ill with multiple organ systems failure and requires high complexity decision making for assessment and support, frequent evaluation  and titration of therapies, application of advanced monitoring technologies and extensive interpretation of multiple databases.   Critical Care Time devoted to patient care services described in this note is 48 minutes. This time reflects the time of my personal involvement. This critical care time does not reflect separately billable procedures or procedure time, teaching time or supervisory time of PA/NP/Med student/Med Resident etc but could involve care discussion time.  [facility] 07/20/2019 8:17 AM  Pager: [phone] After hours pager: [phone]

## 2019-07-20 NOTE — Progress Notes (Signed)
  NEUROSURGERY PROGRESS NOTE   Remains intubated No longer moving BLE.  EXAM:  BP (!) 138/51   Pulse 97   Temp 98.4 F (36.9 C)   Resp (!) 27   Ht 5\' 3"  (1.6 m)   Wt 95.5 kg   SpO2 100%   BMI 37.30 kg/m   Intubated, on low dose fentanyl Follows commands with grip bilaterally L>R No movement BLE to command. ?decreased sensation Cervical incision: c/d/i Lumbar incision: blood on bandage. No active bleeding  IMPRESSION/PLAN 78 y.o. male  POD #7 s/p C5-T1 ACDF for central cord syndrome, POD #5 T10-L3 fusion for unstable T12-L1 chance type fracture. No movement BLE, new compared to yesterday - STAT CT T/L spine

## 2019-07-21 ENCOUNTER — Inpatient Hospital Stay (HOSPITAL_COMMUNITY): Payer: Medicare Other

## 2019-07-21 DIAGNOSIS — S14129D Central cord syndrome at unspecified level of cervical spinal cord, subsequent encounter: Secondary | ICD-10-CM | POA: Diagnosis not present

## 2019-07-21 DIAGNOSIS — J9601 Acute respiratory failure with hypoxia: Secondary | ICD-10-CM | POA: Diagnosis not present

## 2019-07-21 LAB — CBC
HCT: 28.5 % — ABNORMAL LOW (ref 39.0–52.0)
Hemoglobin: 8.8 g/dL — ABNORMAL LOW (ref 13.0–17.0)
MCH: 31.7 pg (ref 26.0–34.0)
MCHC: 30.9 g/dL (ref 30.0–36.0)
MCV: 102.5 fL — ABNORMAL HIGH (ref 80.0–100.0)
Platelets: 47 10*3/uL — ABNORMAL LOW (ref 150–400)
RBC: 2.78 MIL/uL — ABNORMAL LOW (ref 4.22–5.81)
RDW: 17.2 % — ABNORMAL HIGH (ref 11.5–15.5)
WBC: 28.1 10*3/uL — ABNORMAL HIGH (ref 4.0–10.5)
nRBC: 11.3 % — ABNORMAL HIGH (ref 0.0–0.2)

## 2019-07-21 LAB — COOXEMETRY PANEL
Carboxyhemoglobin: 2.2 % — ABNORMAL HIGH (ref 0.5–1.5)
Methemoglobin: 1.2 % (ref 0.0–1.5)
O2 Saturation: 61.7 %
Total hemoglobin: 8.6 g/dL — ABNORMAL LOW (ref 12.0–16.0)

## 2019-07-21 LAB — GLUCOSE, CAPILLARY
Glucose-Capillary: 143 mg/dL — ABNORMAL HIGH (ref 70–99)
Glucose-Capillary: 144 mg/dL — ABNORMAL HIGH (ref 70–99)
Glucose-Capillary: 148 mg/dL — ABNORMAL HIGH (ref 70–99)
Glucose-Capillary: 158 mg/dL — ABNORMAL HIGH (ref 70–99)
Glucose-Capillary: 161 mg/dL — ABNORMAL HIGH (ref 70–99)
Glucose-Capillary: 163 mg/dL — ABNORMAL HIGH (ref 70–99)
Glucose-Capillary: 169 mg/dL — ABNORMAL HIGH (ref 70–99)
Glucose-Capillary: 183 mg/dL — ABNORMAL HIGH (ref 70–99)

## 2019-07-21 LAB — BASIC METABOLIC PANEL
Anion gap: 15 (ref 5–15)
BUN: 133 mg/dL — ABNORMAL HIGH (ref 8–23)
CO2: 17 mmol/L — ABNORMAL LOW (ref 22–32)
Calcium: 7.6 mg/dL — ABNORMAL LOW (ref 8.9–10.3)
Chloride: 114 mmol/L — ABNORMAL HIGH (ref 98–111)
Creatinine, Ser: 4.01 mg/dL — ABNORMAL HIGH (ref 0.61–1.24)
GFR calc Af Amer: 16 mL/min — ABNORMAL LOW (ref >60)
GFR calc non Af Amer: 14 mL/min — ABNORMAL LOW (ref >60)
Glucose, Bld: 177 mg/dL — ABNORMAL HIGH (ref 70–99)
Potassium: 4.5 mmol/L (ref 3.5–5.1)
Sodium: 146 mmol/L — ABNORMAL HIGH (ref 135–145)

## 2019-07-21 LAB — CULTURE, RESPIRATORY W GRAM STAIN: Culture: NORMAL

## 2019-07-21 MED ORDER — VITAL AF 1.2 CAL PO LIQD
1000.0000 mL | ORAL | Status: DC
  Administered 2019-07-21 – 2019-07-24 (×4): 1000 mL

## 2019-07-21 MED ORDER — HEPARIN SODIUM (PORCINE) 1000 UNIT/ML DIALYSIS
1000.0000 [IU] | INTRAMUSCULAR | Status: DC | PRN
  Administered 2019-07-21: 3000 [IU] via INTRAVENOUS_CENTRAL
  Filled 2019-07-21: qty 3
  Filled 2019-07-21 (×2): qty 6

## 2019-07-21 MED ORDER — PRISMASOL BGK 4/2.5 32-4-2.5 MEQ/L IV SOLN
INTRAVENOUS | Status: DC
  Administered 2019-07-21 – 2019-07-25 (×24): via INTRAVENOUS_CENTRAL

## 2019-07-21 MED ORDER — PRISMASOL BGK 4/2.5 32-4-2.5 MEQ/L REPLACEMENT SOLN
Status: DC
  Administered 2019-07-21 – 2019-07-25 (×7): via INTRAVENOUS_CENTRAL

## 2019-07-21 MED ORDER — PIPERACILLIN-TAZOBACTAM 3.375 G IVPB 30 MIN
3.3750 g | Freq: Four times a day (QID) | INTRAVENOUS | Status: DC
  Administered 2019-07-21 – 2019-07-25 (×17): 3.375 g via INTRAVENOUS
  Filled 2019-07-21 (×32): qty 50

## 2019-07-21 MED ORDER — PRISMASOL BGK 4/2.5 32-4-2.5 MEQ/L REPLACEMENT SOLN
Status: DC
  Administered 2019-07-21 – 2019-07-25 (×5): via INTRAVENOUS_CENTRAL

## 2019-07-21 MED ORDER — SODIUM CHLORIDE 0.9 % FOR CRRT
INTRAVENOUS_CENTRAL | Status: DC | PRN
  Administered 2019-07-21: 22:00:00 via INTRAVENOUS_CENTRAL
  Filled 2019-07-21 (×2): qty 1000

## 2019-07-21 MED ORDER — SODIUM CHLORIDE 0.9% IV SOLUTION: Freq: Once | INTRAVENOUS | Status: DC

## 2019-07-21 NOTE — Progress Notes (Signed)
  NEUROSURGERY PROGRESS NOTE   Remains intubated  EXAM:  BP (!) 83/69   Pulse 90   Temp 98.4 F (36.9 C)   Resp (!) 26   Ht 5\' 3"  (1.6 m)   Wt 96.8 kg   SpO2 95%   BMI 37.80 kg/m   Intubated, on fentanyl Follows commands with BUE grip, left bicep at least antigravity Not following commands with BLE Cervical incision: c/d/i Lumbar incision: c/d/i  IMPRESSION/PLAN 78 y.o. male POD #8 s/p C5-T1 ACDF for central cord syndrome, POD #6 T10-L3 fusion for unstable T12-L1 chance type fracture. Stable neurologically. - CT/L spine unrevealing. Will consult Neurology per Dr Kathyrn Sheriff - continue supportive care

## 2019-07-21 NOTE — Consult Note (Signed)
[facility]  HISTORY AND PHYSICAL  Lucas Price is an 78 y.o. male.    Chief Complaint: Weakness  HPI: Pt is a 48 M with a PMH for HTN, HLD, COPD, CAD, who is now seen in consultation at the request of Drs. Bensimhon and Mannam for evaluation and recommendations surrounding AKI and massive volume overload.    Briefly, pt came in with weakness after a fall 07/30/2019.  Developed central cord syndrome and went to the OR 12/2 for C5/6 discectomy and then 12/4 for repair of chance fracture.  Had developing hypoxia, hypotension, volume overload.  Went to [facility] for PA cath placement.  Discovered severely elevated RH pressures, volume overload, and overall a picture consistent with cardiogenic shock and decompensated RHF.  He likely has a component of neurogenic shock as well.  He is on multiple pressors along with INO.  He remains intubated.  His urine output has fallen even with a Lasix gtt @ 30 mL/ hr and he is significantly azotemic with BUN of 133 with worsening Cr of 4.33 today.    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  Discussed this with his son Deno Etienne) and beside and his nephew Jeanie Cooks, a hospitalist) who was on the phone.  They have decided that they would like to proceed with a trial of CRRT.    PMH: Past Medical History:  Diagnosis Date  . Anemia 05/26/2014  . Bladder neck obstruction 04/24/2014  . Chronic respiratory failure with hypoxia ([place])   . CKD (chronic kidney disease) 05/26/2014  . COPD (chronic obstructive pulmonary disease) ([place]) 05/26/2014  . Coronary artery calcification seen on CAT scan   . Diabetes mellitus ([place]) 05/26/2014  . Former smoker 05/26/2014  . GERD (gastroesophageal reflux disease) 05/26/2014  . Glaucoma   . Heart murmur    mild  . HTN (hypertension) 05/26/2014  . Ischemic heart disease    Lexiscan myoview stress test 02/08/2018 - no stress sx reported.  . Lung nodules   . 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 Recurrent urinary tract infection 09/05/2013  . Renal calculus 03/19/2015  . Retention of urine 09/05/2013  . Sleep apnea    wears Bipap  . SOB (shortness of breath)    PSH: Past Surgical History:  Procedure Laterality Date  . ANTERIOR CERVICAL DECOMP/DISCECTOMY FUSION N/A 08/01/2019   Procedure: ANTERIOR CERVICAL DECOMPRESSION/DISCECTOMY FUSION CERVICAL FIVE-SIX, CERVICAL SIX-SEVEN, CERVICAL SEVEN-THORACIC ONE;  Surgeon: Consuella Lose, MD;  Location: [facility];  Service: Neurosurgery;  Laterality: N/A;  ANTERIOR CERVICAL DECOMPRESSION/DISCECTOMY FUSION CERVICAL FIVE-SIX/SIX-SEVEN/SEVEN-THORACIC ONE THREE LEVELS  . APPLICATION OF ROBOTIC ASSISTANCE FOR SPINAL PROCEDURE N/A 08/02/2019   Procedure: APPLICATION OF ROBOTIC ASSISTANCE FOR SPINAL PROCEDURE;  Surgeon: Consuella Lose, MD;  Location: [facility];  Service: Neurosurgery;  Laterality: N/A;  APPLICATION OF ROBOTIC ASSISTANCE FOR SPINAL PROCEDURE  . BLADDER NECK RECONSTRUCTION    . CATARACT EXTRACTION, BILATERAL    . COLONOSCOPY WITH PROPOFOL N/A 09/22/2017   Procedure: COLONOSCOPY WITH PROPOFOL;  Surgeon: Doran Stabler, MD;  Location: [facility];  Service: Gastroenterology;  Laterality: N/A;  . HERNIA REPAIR Bilateral    Inguinal  . POSTERIOR LUMBAR FUSION 4 LEVEL N/A 08/01/2019   Procedure: THORACIC TEN - LUMBAR THREE  FUSION;  Surgeon: Consuella Lose, MD;  Location: [facility];  Service: Neurosurgery;  Laterality: N/A;  THORACIC TEN - LUMBAR THREE  FUSION  . VIDEO BRONCHOSCOPY WITH ENDOBRONCHIAL NAVIGATION N/A 10/07/2017  Procedure: VIDEO BRONCHOSCOPY WITH ENDOBRONCHIAL NAVIGATION;  Surgeon: Collene Gobble, MD;  Location[facility];  Service: Thoracic;  Laterality: N/A;  . VIDEO BRONCHOSCOPY WITH ENDOBRONCHIAL ULTRASOUND N/A 10/07/2017   Procedure: VIDEO BRONCHOSCOPY WITH ENDOBRONCHIAL ULTRASOUND;  Surgeon: Collene Gobble, MD;  Location: [facility];  Service: Thoracic;  Laterality: N/A;    Past Medical  History:  Diagnosis Date  . Anemia 05/26/2014  . Bladder neck obstruction 04/24/2014  . Chronic respiratory failure with hypoxia ([place])   . CKD (chronic kidney disease) 05/26/2014  . COPD (chronic obstructive pulmonary disease) ([place]) 05/26/2014  . Coronary artery calcification seen on CAT scan   . Diabetes mellitus ([place]) 05/26/2014  . Former smoker 05/26/2014  . GERD (gastroesophageal reflux disease) 05/26/2014  . Glaucoma   . Heart murmur    mild  . HTN (hypertension) 05/26/2014  . Ischemic heart disease    Lexiscan myoview stress test 02/08/2018 - no stress sx reported.  . Lung nodules   . 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 Recurrent urinary tract infection 09/05/2013  . Renal calculus 03/19/2015  . Retention of urine 09/05/2013  . Sleep apnea    wears Bipap  . SOB (shortness of breath)     Medications:   Scheduled: . sodium chloride   Intravenous Once  . arformoterol  15 mcg Nebulization BID  . atorvastatin  10 mg Per Tube Daily  . budesonide (PULMICORT) nebulizer solution  0.25 mg Nebulization BID  . calcium carbonate  1 tablet Per Tube Daily  . chlorhexidine gluconate (MEDLINE KIT)  15 mL Mouth Rinse BID  . Chlorhexidine Gluconate Cloth  6 each Topical Daily  . cholecalciferol  5,000 Units Per Tube Daily  . clonazepam  0.25 mg Per Tube BID  . docusate  100 mg Per Tube BID  . dorzolamide  1 drop Both Eyes [id number]  . feeding supplement (PRO-STAT SUGAR FREE 64)  30 mL Per Tube BID  . free water  100 mL Per Tube Q8H  . insulin aspart  0-20 Units Subcutaneous Q4H  . insulin aspart  5 Units Subcutaneous Q4H  . insulin detemir  20 Units Subcutaneous BID  . ipratropium  0.5 mg Nebulization Q6H  . ketorolac  1 drop Both Eyes TID  . latanoprost  1 drop Both Eyes QHS  . mouth rinse  15 mL Mouth Rinse 10 times per day  . midazolam  1 mg Intravenous Once  . pantoprazole sodium  40 mg Per Tube Daily  . polyethylene glycol  17 g Per Tube Daily  .  polyvinyl alcohol  1 drop Both Eyes Q4H  . sodium chloride flush  10-40 mL Intracatheter Q12H    Medications Prior to Admission  Medication Sig Dispense Refill  . allopurinol (ZYLOPRIM) 100 MG tablet Take 1 tablet (100 mg total) by mouth daily. 90 tablet 1  . aspirin EC 81 MG tablet Take 81 mg by mouth daily.     [name] atorvastatin (LIPITOR) 10 MG tablet TAKE 1 TABLET EVERY DAY (Patient taking differently: Take 10 mg by mouth every morning. ) 90 tablet 0  . budesonide-formoterol (SYMBICORT) 160-4.5 MCG/ACT inhaler Inhale 2 puffs into the lungs 2 (two) times daily. (Patient taking differently: Inhale 2 puffs into the lungs every morning. ) 3 Inhaler 3  . calcium carbonate (CALCIUM 600) 600 MG TABS tablet Take 600 mg by mouth daily.     . Cholecalciferol (D 10000) 10000 units CAPS Take 10,000 Units by mouth every  other day.     . Coenzyme Q-10 200 MG CAPS Take 200 mg by mouth daily.     . dorzolamide (TRUSOPT) 2 % ophthalmic solution Place 1 drop into both eyes every morning.     . Insulin Syringe-Needle U-100 (BD INSULIN SYRINGE ULTRAFINE) 31G X 5/16" 0.5 ML MISC 1 each by Does not apply route daily. 100 each 1  . ketorolac (ACULAR) 0.5 % ophthalmic solution Place 1 drop into both eyes 3 (three) times daily.     [name] LANTUS 100 UNIT/ML injection INJECT  50 UNITS SUBCUTANEOUSLY AT BEDTIME (Patient taking differently: Inject 20 Units into the skin at bedtime. ) 50 mL 0  . latanoprost (XALATAN) 0.005 % ophthalmic solution Place 1 drop into both eyes at bedtime.     . metoprolol succinate (TOPROL-XL) 50 MG 24 hr tablet TAKE 1 TABLET EVERY DAY (Patient taking differently: Take 50 mg by mouth daily. ) 90 tablet 0  . OXYGEN Inhale 1.5 L into the lungs continuous.     . sitaGLIPtin (JANUVIA) 50 MG tablet 1 tab daily (Patient taking differently: Take 50 mg by mouth daily. ) 90 tablet 3  . SPIRIVA RESPIMAT 2.5 MCG/ACT AERS INHALE 2 PUFFS INTO THE LUNGS DAILY. (Patient taking differently: Inhale 2 puffs into the  lungs every morning. ) 12 g 3  . Syringe, Disposable, 1 ML MISC Use with insulin needle. 100 each 3  . Travoprost, BAK Free, (TRAVATAN Z) 0.004 % SOLN ophthalmic solution Place 1 drop in both eyes once daily 5 mL 3  . triamcinolone (KENALOG) 0.1 % paste Use as directed 1 application in the mouth or throat 2 (two) times daily. (Patient taking differently: Use as directed 1 application in the mouth or throat 2 (two) times daily as needed (skin care). ) 5 g 12    ALLERGIES:  No Known Allergies  FAM HX: Family History  Problem Relation Age of Onset  . Heart disease Mother   . Diabetes Mother     Social History:   reports that he quit smoking about 11 years ago. His smoking use included cigarettes. He has a 0.20 pack-year smoking history. He has quit using smokeless tobacco. He reports that he does not drink alcohol or use drugs.  ROS: ROS: unobtainable d/t intubation/ sedation  Blood pressure (!) 129/52, pulse 87, temperature 97.7 F (36.5 C), resp. rate (!) 26, height _0  (1.6 m), weight 96.8 kg, SpO2 95 %. PHYSICAL EXAM: Physical Exam  GEN severely ill, unresponsive HEENT + corneal scabs present, L > R NECK in c-collar PULM bilateral coarse breath sounds CV RRR dim heart sounds ABD obese, distended with massive abd wall edema EXT 4+ anasarca  NEURO intubated, sedated SKIN areas of blistering and weeping   Results for orders placed or performed during the hospital encounter of 07/30/2019 (from the past 48 hour(s))  Glucose, capillary     Status: Abnormal   Collection Time: 07/19/19  8:28 PM  Result Value Ref Range   Glucose-Capillary 201 (H) 70 - 99 mg/dL  Glucose, capillary     Status: Abnormal   Collection Time: 07/20/19 12:04 AM  Result Value Ref Range   Glucose-Capillary 208 (H) 70 - 99 mg/dL  Basic metabolic panel     Status: Abnormal   Collection Time: 07/20/19  5:08 AM  Result Value Ref Range   Sodium 148 (H) 135 - 145 mmol/L   Potassium 4.2 3.5 - 5.1 mmol/L    Chloride 113 (H) 98 - 111  mmol/L   CO2 19 (L) 22 - 32 mmol/L   Glucose, Bld 214 (H) 70 - 99 mg/dL   BUN 118 (H) 8 - 23 mg/dL   Creatinine, Ser 3.72 (H) 0.61 - 1.24 mg/dL   Calcium 7.7 (L) 8.9 - 10.3 mg/dL   GFR calc non Af Amer 15 (L) >60 mL/min   GFR calc Af Amer 17 (L) >60 mL/min   Anion gap 16 (H) 5 - 15    Comment: Performed at [facility] [address] [place], [place] [address]  Vancomycin, random     Status: None   Collection Time: 07/20/19  5:08 AM  Result Value Ref Range   Vancomycin Rm 11     Comment:        Random Vancomycin therapeutic range is dependent on dosage and time of specimen collection. A peak range is 20.0-40.0 ug/mL A trough range is 5.0-15.0 ug/mL        Performed at [facility] [address] [place], [place] [address]   Glucose, capillary     Status: Abnormal   Collection Time: 07/20/19  5:16 AM  Result Value Ref Range   Glucose-Capillary 194 (H) 70 - 99 mg/dL  Glucose, capillary     Status: Abnormal   Collection Time: 07/20/19  7:39 AM  Result Value Ref Range   Glucose-Capillary 178 (H) 70 - 99 mg/dL  Glucose, capillary     Status: Abnormal   Collection Time: 07/20/19 11:35 AM  Result Value Ref Range   Glucose-Capillary 187 (H) 70 - 99 mg/dL  Glucose, capillary     Status: Abnormal   Collection Time: 07/20/19  4:06 PM  Result Value Ref Range   Glucose-Capillary 158 (H) 70 - 99 mg/dL  Glucose, capillary     Status: Abnormal   Collection Time: 07/20/19  8:06 PM  Result Value Ref Range   Glucose-Capillary 173 (H) 70 - 99 mg/dL  Glucose, capillary     Status: Abnormal   Collection Time: 07/21/19 12:18 AM  Result Value Ref Range   Glucose-Capillary 161 (H) 70 - 99 mg/dL  Glucose, capillary     Status: Abnormal   Collection Time: 07/21/19  4:08 AM  Result Value Ref Range   Glucose-Capillary 158 (H) 70 - 99 mg/dL  Basic metabolic panel     Status: Abnormal   Collection Time: 07/21/19  4:32 AM  Result Value Ref Range    Sodium 146 (H) 135 - 145 mmol/L   Potassium 4.5 3.5 - 5.1 mmol/L   Chloride 114 (H) 98 - 111 mmol/L   CO2 17 (L) 22 - 32 mmol/L   Glucose, Bld 177 (H) 70 - 99 mg/dL   BUN 133 (H) 8 - 23 mg/dL   Creatinine, Ser 4.01 (H) 0.61 - 1.24 mg/dL   Calcium 7.6 (L) 8.9 - 10.3 mg/dL   GFR calc non Af Amer 14 (L) >60 mL/min   GFR calc Af Amer 16 (L) >60 mL/min   Anion gap 15 5 - 15    Comment: Performed at [facility], [place] [address] [place], [place] [address]  CBC     Status: Abnormal   Collection Time: 07/21/19  4:32 AM  Result Value Ref Range   WBC 28.1 (H) 4.0 - 10.5 K/uL   RBC 2.78 (L) 4.22 - 5.81 MIL/uL   Hemoglobin 8.8 (L) 13.0 - 17.0 g/dL   HCT 28.5 (L) 39.0 - 52.0 %   MCV 102.5 (H)  80.0 - 100.0 fL   MCH 31.7 26.0 - 34.0 pg   MCHC 30.9 30.0 - 36.0 g/dL   RDW 17.2 (H) 11.5 - 15.5 %   Platelets 47 (L) 150 - 400 K/uL    Comment: REPEATED TO VERIFY SPECIMEN CHECKED FOR CLOTS Immature Platelet Fraction may be clinically indicated, consider ordering this additional test [id number] CONSISTENT WITH PREVIOUS RESULT    nRBC 11.3 (H) 0.0 - 0.2 %    Comment: Performed at [facility] [address] [place], [place] [address]  .Cooxemetry Panel (carboxy, met, total hgb, O2 sat)     Status: Abnormal   Collection Time: 07/21/19  8:00 AM  Result Value Ref Range   Total hemoglobin 8.6 (L) 12.0 - 16.0 g/dL   O2 Saturation 61.7 %   Carboxyhemoglobin 2.2 (H) 0.5 - 1.5 %   Methemoglobin 1.2 0.0 - 1.5 %    Comment: Performed at [facility] [address] [place], [place] [address]  Glucose, capillary     Status: Abnormal   Collection Time: 07/21/19  8:12 AM  Result Value Ref Range   Glucose-Capillary 163 (H) 70 - 99 mg/dL  Glucose, capillary     Status: Abnormal   Collection Time: 07/21/19  9:01 AM  Result Value Ref Range   Glucose-Capillary 148 (H) 70 - 99 mg/dL  Glucose, capillary     Status: Abnormal   Collection Time: 07/21/19 11:59 AM  Result Value Ref Range    Glucose-Capillary 143 (H) 70 - 99 mg/dL  Glucose, capillary     Status: Abnormal   Collection Time: 07/21/19  4:03 PM  Result Value Ref Range   Glucose-Capillary 169 (H) 70 - 99 mg/dL    CT THORACIC SPINE WO CONTRAST  Result Date: 07/20/2019 CLINICAL DATA:  Status post fusions at C5-T1 and T10-L3. New sensory deficits in the lower extremities. Recent C5-6 fractures and L1 fractures. EXAM: CT THORACIC AND LUMBAR SPINE WITHOUT CONTRAST TECHNIQUE: Multidetector CT imaging of the thoracic and lumbar spine was performed without contrast. Multiplanar CT image reconstructions were also generated. COMPARISON:  MRIs of the cervical and lumbar spine dated 08/10/2019 and CT scan of the lumbar spine dated 07/14/2019 FINDINGS: CT THORACIC SPINE FINDINGS Alignment: Normal. Vertebrae: The spine is fused from T2 through T5, likely congenital from T2-T4 and due to anterior osteophytes at T4-5. Osteophytes also fuse the vertebrae from T6 through T12. There is also posterior surgical fusion from T10 through T12. Paraspinal and other soft tissues: There are small bilateral pleural effusions, right greater than left with compressive atelectasis in the lower lobes. Aortic atherosclerosis. Disc levels: The discs from T1-2 through T11-12 demonstrate no extrusions or protrusions. Asymmetric endplate osteophytes at T9-10 extend to the right with slight compression of the right side of the thecal sac. Pedicle screws at T10-11 and T11-12 appear in good position with no evidence of loosening. 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 Alignment: Normal. Vertebrae: The patient has undergone posterior fusion from T10-L1. The hardware appears in excellent position. No other change since the prior study. Again noted is the chance fracture through the T12-L1 level. There is no visible protrusion of bone or disc material into the spinal canal at the level of the fracture. No visible epidural hematoma.  Paraspinal and other soft tissues: No acute abnormalities of the paraspinal soft tissues in the abdomen or pelvis. Aortic atherosclerosis. Foley catheter in the bladder. Disc levels: T12-L1: The previously described fracture extends  through the disc space and vertebral endplates and spinous process of L1. No paraspinal hematoma or appreciable disc protrusion. L1-2: Chronic disc space narrowing with small broad-based disc bulge with accompanying osteophytes. L2-3: Disc space narrowing. Small disc protrusion to the right of midline, unchanged. L3-4: Disc space narrowing. Broad-based disc protrusion with accompanying osteophytes, unchanged. Moderate central canal stenosis. L4-5: Chronic disc space narrowing with a broad-based central disc protrusion with accompanying osteophytes with compression of thecal sac with hypertrophy of the ligamentum flavum and facet joints combining to create the spinal stenosis. L5-S1: Congenital fusion.  Vestigial disc. IMPRESSION: CT THORACIC SPINE IMPRESSION No acute abnormalities of the thoracic spine. Most of the thoracic spine is either congenitally or degenerative leaf used as described. CT LUMBAR SPINE IMPRESSION Interval posterior fusion from T10 to L3 with stabilization of the Chance fracture of T12-L1. No epidural hematoma. 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 Electronically Signed   By: Lorriane Shire M.D.   On: 07/20/2019 10:26   CT LUMBAR SPINE WO CONTRAST  Result Date: 07/20/2019 CLINICAL DATA:  Status post fusions at C5-T1 and T10-L3. New sensory deficits in the lower extremities. Recent C5-6 fractures and L1 fractures. EXAM: CT THORACIC AND LUMBAR SPINE WITHOUT CONTRAST TECHNIQUE: Multidetector CT imaging of the thoracic and lumbar spine was performed without contrast. Multiplanar CT image reconstructions were also generated. COMPARISON:  MRIs of the cervical  and lumbar spine dated 07/25/2019 and CT scan of the lumbar spine dated 07/14/2019 FINDINGS: CT THORACIC SPINE FINDINGS Alignment: Normal. Vertebrae: The spine is fused from T2 through T5, likely congenital from T2-T4 and due to anterior osteophytes at T4-5. Osteophytes also fuse the vertebrae from T6 through T12. There is also posterior surgical fusion from T10 through T12. Paraspinal and other soft tissues: There are small bilateral pleural effusions, right greater than left with compressive atelectasis in the lower lobes. Aortic atherosclerosis. Disc levels: The discs from T1-2 through T11-12 demonstrate no extrusions or protrusions. Asymmetric endplate osteophytes at T9-10 extend to the right with slight compression of the right side of the thecal sac. Pedicle screws at T10-11 and T11-12 appear in good position with no evidence of loosening. 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 Alignment: Normal. Vertebrae: The patient has undergone posterior fusion from T10-L1. The hardware appears in excellent position. No other change since the prior study. Again noted is the chance fracture through the T12-L1 level. There is no visible protrusion of bone or disc material into the spinal canal at the level of the fracture. No visible epidural hematoma. Paraspinal and other soft tissues: No acute abnormalities of the paraspinal soft tissues in the abdomen or pelvis. Aortic atherosclerosis. Foley catheter in the bladder. Disc levels: T12-L1: The previously described fracture extends through the disc space and vertebral endplates and spinous process of L1. No paraspinal hematoma or appreciable disc protrusion. L1-2: Chronic disc space narrowing with small broad-based disc bulge with accompanying osteophytes. L2-3: Disc space narrowing. Small disc protrusion to the right of midline, unchanged. L3-4: Disc space narrowing. Broad-based disc protrusion with accompanying osteophytes,  unchanged. Moderate central canal stenosis. L4-5: Chronic disc space narrowing with a broad-based central disc protrusion with accompanying osteophytes with compression of thecal sac with hypertrophy of the ligamentum flavum and facet joints combining to create the spinal stenosis. L5-S1: Congenital fusion.  Vestigial disc. IMPRESSION: CT THORACIC SPINE IMPRESSION No acute abnormalities of the thoracic  spine. Most of the thoracic spine is either congenitally or degenerative leaf used as described. CT LUMBAR SPINE IMPRESSION Interval posterior fusion from T10 to L3 with stabilization of the Chance fracture of T12-L1. No epidural hematoma. 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 Electronically Signed   By: Lorriane Shire M.D.   On: 07/20/2019 10:26    Assessment/Plan  1.  Acute on chronic kidney injury: In the setting of cardiogenic shock, decompensated RHF.  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  It is unclear whether or not pt will even tolerate CRRT.  Discussed with Prashant and Satish.  Although we may be able to improve hemodynamics via volume removal, we are unlikely to change the overall clinical trajectory.  I discussed with them that we would proceed with a time-limited trial of CRRT per their request.  Discussed that if risks exceeded benefits at any time or if pt became hemodyanmically unstable, we would discontinue treatment and not retrial.  Appreciate PCCM assistance with access.   2.  Acute decompensated RHF: AHF following.  On epi/ norepi/ vaso/ INO.    3.  Central cord syndrome: s/p decompression 12/2 and repair of chance fracture 12/4.  In c-collar  4.  Acute hypoxic RF: not weaning  5.  Afib with RVR- on amio gtt  6.  Dispo: grim prognosis. If no improvement after 48 hr trial of CRRT, recommend a palliative approach.  He is not a long-term dialysis candidate.    Madelon Lips 07/21/2019, 4:59 PM

## 2019-07-21 NOTE — Progress Notes (Signed)
[facility] Progress Note Patient Name: EMERICK WEATHERLY DOB: 1940/09/07 MRN: [id number]   Date of Service  07/21/2019  HPI/Events of Note  ET tube 6.5 cm above the carina, approximately at the level of the clavicles. Dialysis catheter in the brachiocephalic vein.  eICU Interventions  Order entered to advance the ETT 3 cm, dialysis catheter cleared for use.        Kerry Kass Jessina Marse 07/21/2019, 8:12 PM

## 2019-07-21 NOTE — Progress Notes (Signed)
Advanced Heart Failure Rounding Note  PCP-Cardiologist: No primary care provider on file.   Subjective:     On Epi 5 mcg, vasopressin 0.03 units, norepi 32 mcg. iNO at 15 lasix drip at 30 mg per hour. Co-ox 62%  Remains intubated. Non-responsive  Creatinine up to 4.0  BUN 133. Only 600 cc urine out. Weight up 35 pounds from baseline .   Swan #s CVP 12  PA 69/33 (47) PCWP 16   SVR 931 PVR 8.5 CO 4.2 CI 2.2    Objective:   Weight Range: 96.8 kg Body mass index is 37.8 kg/m.   Vital Signs:   Temp:  [97.7 F (36.5 C)-98.4 F (36.9 C)] 97.7 F (36.5 C) (12/10 1400) Pulse Rate:  [83-114] 83 (12/10 1400) Resp:  [19-28] 26 (12/10 1400) BP: (83-137)/(45-94) 119/55 (12/10 1400) SpO2:  [93 %-98 %] 97 % (12/10 1400) Arterial Line BP: (96-132)/(40-54) 109/44 (12/10 1400) FiO2 (%):  [40 %] 40 % (12/10 1353) Weight:  [96.8 kg] 96.8 kg (12/10 0404) Last BM Date: 07/11/19  Weight change: Filed Weights   07/19/19 0500 07/20/19 0500 07/21/19 0404  Weight: 89.4 kg 95.5 kg 96.8 kg    Intake/Output:   Intake/Output Summary (Last 24 hours) at 07/21/2019 1501 Last data filed at 07/21/2019 1338 Gross per 24 hour  Intake 2606.8 ml  Output 340 ml  Net 2266.8 ml      Physical Exam   General:  Critically ill on vent  Unresponsive HEENT: normal + ETT Neck: supple. + RIJ swan c-collar Cor: PMI nondisplaced. Regular rate & rhythm. No rubs, gallops or murmurs. Lungs: coarse Abdomen: obese  soft, nontender, + distended. No hepatosplenomegaly. No bruits or masses. Good bowel sounds. Extremities: no cyanosis, clubbing, rash, 4+ weeping edema Neuro: Critically ill on vent  Unresponsive   Telemetry   ST 100s   EKG    N/a   Labs    CBC Recent Labs    07/19/19 0420 07/21/19 0432  WBC 24.3* 28.1*  HGB 9.4* 8.8*  HCT 31.6* 28.5*  MCV 105.3* 102.5*  PLT 88* 47*   Basic Metabolic Panel Recent Labs    07/18/19 1602 07/20/19 0508 07/21/19 0432  NA  --  148*  146*  K  --  4.2 4.5  CL  --  113* 114*  CO2  --  19* 17*  GLUCOSE  --  214* 177*  BUN  --  118* 133*  CREATININE  --  3.72* 4.01*  CALCIUM  --  7.7* 7.6*  MG 2.2  --   --   PHOS 4.2  --   --    Liver Function Tests Recent Labs    07/19/19 0420  AST 135*  ALT 218*  ALKPHOS 85  BILITOT 1.1  PROT 5.0*  ALBUMIN 1.9*   No results for input(s): LIPASE, AMYLASE in the last 72 hours. Cardiac Enzymes No results for input(s): CKTOTAL, CKMB, CKMBINDEX, TROPONINI in the last 72 hours.  BNP: BNP (last 3 results) No results for input(s): BNP in the last 8760 hours.  ProBNP (last 3 results) No results for input(s): PROBNP in the last 8760 hours.   D-Dimer No results for input(s): DDIMER in the last 72 hours. Hemoglobin A1C No results for input(s): HGBA1C in the last 72 hours. Fasting Lipid Panel No results for input(s): CHOL, HDL, LDLCALC, TRIG, CHOLHDL, LDLDIRECT in the last 72 hours. Thyroid Function Tests No results for input(s): TSH, T4TOTAL, T3FREE, THYROIDAB in the last 72 hours.  Invalid input(s): FREET3  Other results:   Imaging    No results found.   Medications:     Scheduled Medications: . arformoterol  15 mcg Nebulization BID  . atorvastatin  10 mg Per Tube Daily  . budesonide (PULMICORT) nebulizer solution  0.25 mg Nebulization BID  . calcium carbonate  1 tablet Per Tube Daily  . chlorhexidine gluconate (MEDLINE KIT)  15 mL Mouth Rinse BID  . Chlorhexidine Gluconate Cloth  6 each Topical Daily  . cholecalciferol  5,000 Units Per Tube Daily  . clonazepam  0.25 mg Per Tube BID  . docusate  100 mg Per Tube BID  . dorzolamide  1 drop Both Eyes [id number]  . feeding supplement (PRO-STAT SUGAR FREE 64)  30 mL Per Tube BID  . free water  100 mL Per Tube Q8H  . insulin aspart  0-20 Units Subcutaneous Q4H  . insulin aspart  5 Units Subcutaneous Q4H  . insulin detemir  20 Units Subcutaneous BID  . ipratropium  0.5 mg Nebulization Q6H  . ketorolac  1 drop  Both Eyes TID  . latanoprost  1 drop Both Eyes QHS  . mouth rinse  15 mL Mouth Rinse 10 times per day  . midazolam  1 mg Intravenous Once  . pantoprazole sodium  40 mg Per Tube Daily  . polyethylene glycol  17 g Per Tube Daily  . polyvinyl alcohol  1 drop Both Eyes Q4H  . sodium chloride flush  10-40 mL Intracatheter Q12H    Infusions: . sodium chloride Stopped (07/18/19 1257)  . amiodarone 30 mg/hr (07/21/19 1300)  . epinephrine 4.8 mcg/min (07/21/19 1300)  . feeding supplement (VITAL AF 1.2 CAL) 1,000 mL (07/21/19 1331)  . fentaNYL infusion INTRAVENOUS 50 mcg/hr (07/20/19 1830)  . furosemide (LASIX) infusion 30 mg/hr (07/21/19 1345)  . norepinephrine (LEVOPHED) Adult infusion 32 mcg/min (07/21/19 1324)  . piperacillin-tazobactam (ZOSYN)  IV 2.25 g (07/21/19 1338)  . vasopressin (PITRESSIN) infusion - *FOR SHOCK* 0.03 Units/min (07/21/19 1326)    PRN Medications: Place/Maintain arterial line **AND** sodium chloride, acetaminophen **OR** acetaminophen, bisacodyl, fentaNYL, [DISCONTINUED] ondansetron **OR** ondansetron (ZOFRAN) IV, polyvinyl alcohol, sodium chloride, sodium chloride flush    Assessment/Plan   1. 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  - Now with worsening MSOF  - If family decides to continue will need trach/CVVHD/PEG. Given poor baseline functional status I do not feel continuing is reasonable   3. Acute on chronic hypoxic respiratory railure  - Extubated but required reintubation 07/17/19 - Unable to wean from vent   4. PAH with A/C Cor pulmonale - on inhaled NO. Swan numbers as above  5. A Fib RVR Placed on amiodarone drip with conversion to Sinus Tach  - Continue amiodarone drip.   6. AKI on CKD Stage IIIb  - Creatinine  baseline 1.8.  - Creatinine continues to rise 2.7>3.3> 3.7 > 4.01  . BUN 33 - Poor urine output despite inotrope support now uremic and volume overloaded - Will need CVVHD if family decides to continue  7. Hypernatremia    Long talk with family today including son and nephew (who is hospitalist). They are focusing on why he wasn't anticoagulated after surgery (too high risk due to spinal cord injury/surgery) and why his cervical  site hasn't been imaged (NSU following).   I told them that I felt that they were missing the big picture and that his chance for meaningful survival was small if non-existent. Despite that, they want to continue with aggressive care including CVVHD. I d/w CCM who will place catheter. No change in inotropes at this time   CRITICAL CARE Performed by: Glori Bickers  Total critical care time: 45 minutes  Critical care time was exclusive of separately billable procedures and treating other patients.  Critical care was necessary to treat or prevent imminent or life-threatening deterioration.  Critical care was time spent personally by me (independent of midlevel providers or residents) on the following activities: development of treatment plan with patient and/or surrogate as well as nursing, discussions with consultants, evaluation of patient's response to treatment, examination of patient, obtaining history from patient or surrogate, ordering and performing treatments and interventions, ordering and review of laboratory studies, ordering and review of radiographic studies, pulse oximetry and re-evaluation of patient's condition.    Length of Stay: Orin, MD  07/21/2019, 3:01 PM  Advanced Heart Failure Team Pager [phone] (M-F; 7a - 4p)  Please contact [facility] for night-coverage after hours (4p -7a ) and weekends on [email or web address]

## 2019-07-21 NOTE — Plan of Care (Signed)
Asked to place trialysis catheter for initiation of CRRT.   R IJ Swan in place. C-collar in Place. Very volume overloaded. Plt 45.  -did not feel that L IJ would be suitable site (unable to turn head, C-collar would overlay trailysis which requires frequent maintenance). -Edema/abdominal pannus obscure fem view. Positioned flat for better site evaluation. Fem arteries visualized but bilateral fem veins with poor visualization. Patient began to desaturate mildly and hemodynamically decompensate. Put in elevated HOB position.  -Will ask PCCM attending to evaluate for possible subclavian since patient unable to tolerate position.    Eliseo Gum MSN, AGACNP-BC [place] [phone] If no answer, [phone] 07/21/2019, 5:21 PM

## 2019-07-21 NOTE — Procedures (Signed)
Hemodialysis Catheter Insertion Procedure Note Lucas Price [id number] 04-15-1941  Procedure: Insertion of Hemodialysis Catheter Indications: Dialysis Access   Procedure Details Consent: Risks of procedure as well as the alternatives and risks of each were explained to the (patient/caregiver).  Consent for procedure obtained. Time Out: Verified patient identification, verified procedure, site/side was marked, verified correct patient position, special equipment/implants available, medications/allergies/relevent history reviewed, required imaging and test results available.  Performed  Maximum sterile technique was used including antiseptics, cap, gloves, gown, hand hygiene, mask and sheet. Skin prep: Chlorhexidine; local anesthetic administered Triple lumen hemodialysis catheter was inserted into right subclavian vein using the Seldinger technique. Ultrasound guidance. Poor blood flow at 15cm - pulled back to 13cm with good flow.   Evaluation Blood flow adequate Complications: No apparent complications Patient did tolerate procedure well. Chest X-ray ordered to verify placement.  CXR: pending.   Lucas Price Lucas Price 07/21/2019

## 2019-07-21 NOTE — Progress Notes (Signed)
Engaged in initial visit with Mr. Lucas Price and his son.  Chaplain offered support.  Chaplain will follow-up.

## 2019-07-21 NOTE — Progress Notes (Addendum)
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  12/3 Hypotensive, PCCM consult.  12/4 more hypoxic. Requiring HFO2 at 15 liters.  went back to OR  for Lumbar 3 fusion of unstable lumbar fracture. Returned to ICU on vent  12/5 weaning but lower Vts, upper extremity strength seems improved.  12/6: Extubated. Was initially awake. Alert. Following commands and in no distress. Then became suddenly unresponsive, acutely hypotensive and began to brady down. Was emergently re-intubated. Working dx at this point raising concern for sig cardiac component  12/7: PA catheter placement showed significant pulmonary hypertension  Consults:  Cardiology, heart failure Neurosurgery  Procedures:  PA catheter placed 12/7: On NE 50  CVP 14 PA 61/30 (44) PCWP 12 SVR 877 PVR 8.0 WU Thermo CO/CI = 4.0/2.1  Significant Diagnostic Tests:  Echocardiogram on 12/7 demonstrates elevated right atrial pressures, estimated RVSP is 102 mmHg, the RV apex is akinetic and the left ventricle systolic ejection fraction is 60 to 65%.  Micro Data:  Urinalysis on 12 7 with pyuria, bacteria present. Q aspirate 12/8 consistent with normal respiratory flora MRSA PCR on 12/4 is negative. Blood cultures 12/7 negative COVID-19 negative on 12/1  Antimicrobials:  Azithromycin 12/2-12/7  Ceftriaxone 12/2-12/7 Cefepime 12/7  Vancomycin 12/6 >> Piperacillin tazobactam 12/7 >>  Interim history/subjective:  Urine output is worsening with elevated creatinine Continues on nor epi, vaso, epi and lasix drip  Objective   Blood pressure (!) 83/69, pulse 90, temperature 98.4 F (36.9 C), resp. rate (!) 26, height 5\' 3"  (1.6 m), weight 96.8 kg, SpO2 97 %. PAP: (67-76)/(31-44) 73/41 CVP:  [3 mmHg-38 mmHg] 14 mmHg PCWP:  [16 mmHg] 16 mmHg CO:  [4.2 L/min] 4.2 L/min CI:  [2.2 L/min/m2] 2.2 L/min/m2  Vent Mode: PRVC FiO2 (%):  [40 %] 40 % Set Rate:  [26 bmp] 26 bmp Vt Set:  [450 mL] 450 mL PEEP:  [5 cmH20-10 cmH20] 5 cmH20 Plateau Pressure:  [22 cmH20-36 cmH20] 24 cmH20   Intake/Output Summary (Last 24 hours) at 07/21/2019 1017 Last data filed at 07/21/2019 0600 Gross per 24 hour  Intake 2642.52 ml  Output 580 ml  Net 2062.52 ml   Filed Weights   07/19/19 0500 07/20/19 0500 07/21/19 0404  Weight: 89.4 kg 95.5 kg 96.8 kg    Examination: Gen:      No acute distress, debilitated HEENT:  EOMI, sclera anicteric, ETT Neck:     No masses; no thyromegaly Lungs:    Clear to auscultation bilaterally; normal respiratory effort CV:         Regular rate and rhythm; no murmurs Abd:      + bowel sounds; soft, non-tender; no palpable masses, no distension Ext:    Anasarca; adequate peripheral perfusion Skin:      Warm and dry; no rash Neuro: Opens eyes, nonpurposeful Lines: Right PA catheter, sided PICC line, Foley catheter, OG tube, endotracheal tube, left radial arterial line  Assessment & Plan:  78 year old gentleman with insulin-dependent diabetes, coronary artery disease which has not been intervened on, and CKD stage III who presents initially with central cord syndrome now found to have mixed shock and respiratory failure.  Possible mixed neurogenic cardiogenic shock with RV failure A. fib with RVR Coronary artery disease Continue pressors, inhaled nitric oxide   Continue amiodarone  AKI Low urine output on Lasix drip He is massively volume overloaded.  Will need CVVH to remove volume if we are to continue current level of care We will discuss with cardiology and primary team  Respiratory failure Continue vent support for now. Not a candidate for pressure support weans at current state Will eventually need tracheostomy to enable weaning pending goals of care discussion  Best practice:  Diet: Tube feeds are ordered.  Will be advanced to goal. Pain/Anxiety/Delirium protocol (if indicated): Fentanyl ordered VAP protocol (if indicated): VAP is ordered DVT prophylaxis: SCDs GI prophylaxis: PPI Glucose control: Insulin Foley: yes Mobility: Bedrest Code Status: Full code Family Communication: Per primary team and cardiology Disposition: ICU   Labs   Significant for venous O2 sat of 62. BUN/creatinine 133/4.01 WBC 28.1, hemoglobin 8.8, platelets 47  Critical care time:    The patient is critically ill with multiple organ system failure and requires high complexity decision making for assessment and support, frequent evaluation and titration of therapies, advanced monitoring, review of radiographic studies and interpretation of complex data.   Critical Care Time devoted to patient care services, exclusive of separately billable procedures, described in this note is 35 minutes.   Marshell Garfinkel MD [facility] Pager [phone] If no answer call [phone] 07/21/2019, 9:24 AM

## 2019-07-21 NOTE — Progress Notes (Signed)
Pharmacy Antibiotic Note  Lucas Price is a 78 y.o. male admitted on 08/09/2019 s/p fall.  Pharmacy had been consulted for empiric vancomycin dosing 12/6 patient became  febrile 101 WBC 29 - on HCT for stress dose steroids in setting of hypotension - neurogenic/spetic shock. Patient remains intermittantly febrile - zosyn started, Cr rising now starting CRT  Plan: Change Zosyn to 3.375gm IV q6h - CRT dosing  Vancomycin - DC 12/10   Height: 5\' 3"  (160 cm) Weight: 213 lb 6.5 oz (96.8 kg) IBW/kg (Calculated) : 56.9  Temp (24hrs), Avg:97.9 F (36.6 C), Min:97.5 F (36.4 C), Max:98.4 F (36.9 C)  Recent Labs  Lab 07/16/19 0608 07/17/19 0511 07/17/19 2010 07/17/19 2309 07/18/19 0456 07/19/19 0420 07/19/19 1639 07/20/19 0508 07/21/19 0432  WBC 13.6* 17.4*  --   --  29.0* 24.3*  --   --  28.1*  CREATININE 2.08* 2.14*  --   --  2.72* 3.33* 3.59* 3.72* 4.01*  LATICACIDVEN  --   --  1.7 1.9  --   --   --   --   --   VANCORANDOM  --   --   --   --   --   --   --  11  --     Estimated Creatinine Clearance: 16.2 mL/min (A) (by C-G formula based on SCr of 4.01 mg/dL (H)).    No Known Allergies  Antimicrobials this admission: CTX 12/2 >>12/7 Azith 12/3 >> 12/7 Vanc 12/6 >>12/10 Zosyn 12/8>   Microbiology results: 12/1 covid - negative 12/2 MRSA PCR - negative 12/4 surgical screen - negative 12/7 Bcx: NGTD   Bonnita Nasuti Pharm.D. CPP, BCPS Clinical Pharmacist [phone] 07/21/2019 6:26 PM

## 2019-07-21 NOTE — Progress Notes (Signed)
Nutrition Follow up  DOCUMENTATION CODES:   Not applicable  INTERVENTION:   No BM x 9 days-regimen in place   Increase tube feeding:  -Vital AF 1.2 @ 55 ml/hr (1320 ml/day) via OG tube -30 ml Prostat BID -Free water flushes 100 ml Q8 hours- per CCM  Provides: 1784 kcal, 129 grams protein, and 1071 ml free water (1371 ml with flushes).   NUTRITION DIAGNOSIS:   Increased nutrient needs related to post-op healing as evidenced by estimated needs.  Ongoing  GOAL:   Patient will meet greater than or equal to 90% of their needs   Meeting via TF  MONITOR:   Vent status, TF tolerance, I & O's  REASON FOR ASSESSMENT:   Consult, Ventilator Enteral/tube feeding initiation and management  ASSESSMENT:   Pt with PMH of COPD, CKD, DM, GERD, HTN, ischemic heart dz who fell while climbing stairs admitted 12/1 with central cord syndrome s/p C5-T1 ACDF and T10-L3 fusion. Pt with hypotension and remains intubated after surgery.   12/2- s/p spinal cord decompression 12/4- s/p lumbar 3 fusion  12/6- extubated, re-intubated  12/7- s/p PA catheter- showed pulmonary HTN  RD working remotely.  Stable on current pressors. UOP worse despite lasix. Volume overloaded. Needs CRRT. May require trach pending GOC discussion.  Hypernatremia resolving. No BM x 9 days-regimen in place. Tolerating TF at goal. RD to adjust rate to better meet needs.    Admission weight: 78.5 kg  Current weight: 96.8 kg (+4 generalized edema)   Patient remains intubated on ventilator support MV: 11.5 L/min Temp (24hrs), Avg:98.1 F (36.7 C), Min:97.7 F (36.5 C), Max:98.4 F (36.9 C)    I/O: +16,574 ml since admit UOP: 640 ml x 24 hrs   Drips: amiodarone, epinephrine, 250 mg lasix in D5, levophed, vasopressin Medications: os-cal, Vit D, colace, SS novolog, levemir, miralax Labs: Na 146 (H)- improving Cr 4.010-trending up BUN 177 CBG 148-187  Diet Order:   Diet Order    None      EDUCATION NEEDS:    No education needs have been identified at this time  Skin:  Skin Assessment: Reviewed RN Assessment(incisions)  Last BM:  12/1  Height:   Ht Readings from Last 1 Encounters:  08/06/2019 5\' 3"  (1.6 m)    Weight:   Wt Readings from Last 1 Encounters:  07/21/19 96.8 kg    Ideal Body Weight:  56.4 kg  BMI:  Body mass index is 37.8 kg/m.  Estimated Nutritional Needs:   Kcal:  1772 kcal  Protein:  120-135 grams  Fluid:  >/= 1.7 L.day   Mariana Single RD, LDN Clinical Nutrition Pager # [phone]

## 2019-07-22 ENCOUNTER — Inpatient Hospital Stay (HOSPITAL_COMMUNITY): Payer: Medicare Other

## 2019-07-22 LAB — POCT I-STAT 7, (LYTES, BLD GAS, ICA,H+H)
Acid-base deficit: 8 mmol/L — ABNORMAL HIGH (ref 0.0–2.0)
Acid-base deficit: 8 mmol/L — ABNORMAL HIGH (ref 0.0–2.0)
Bicarbonate: 18.5 mmol/L — ABNORMAL LOW (ref 20.0–28.0)
Bicarbonate: 18.7 mmol/L — ABNORMAL LOW (ref 20.0–28.0)
Calcium, Ion: 1.07 mmol/L — ABNORMAL LOW (ref 1.15–1.40)
Calcium, Ion: 1.08 mmol/L — ABNORMAL LOW (ref 1.15–1.40)
HCT: 25 % — ABNORMAL LOW (ref 39.0–52.0)
HCT: 26 % — ABNORMAL LOW (ref 39.0–52.0)
Hemoglobin: 8.5 g/dL — ABNORMAL LOW (ref 13.0–17.0)
Hemoglobin: 8.8 g/dL — ABNORMAL LOW (ref 13.0–17.0)
O2 Saturation: 80 %
O2 Saturation: 81 %
Patient temperature: 36.4
Patient temperature: 36.4
Potassium: 4.3 mmol/L (ref 3.5–5.1)
Potassium: 4.3 mmol/L (ref 3.5–5.1)
Sodium: 141 mmol/L (ref 135–145)
Sodium: 141 mmol/L (ref 135–145)
TCO2: 20 mmol/L — ABNORMAL LOW (ref 22–32)
TCO2: 20 mmol/L — ABNORMAL LOW (ref 22–32)
pCO2 arterial: 42.1 mmHg (ref 32.0–48.0)
pCO2 arterial: 42.2 mmHg (ref 32.0–48.0)
pH, Arterial: 7.247 — ABNORMAL LOW (ref 7.350–7.450)
pH, Arterial: 7.251 — ABNORMAL LOW (ref 7.350–7.450)
pO2, Arterial: 49 mmHg — ABNORMAL LOW (ref 83.0–108.0)
pO2, Arterial: 51 mmHg — ABNORMAL LOW (ref 83.0–108.0)

## 2019-07-22 LAB — CBC
HCT: 27.9 % — ABNORMAL LOW (ref 39.0–52.0)
Hemoglobin: 8.4 g/dL — ABNORMAL LOW (ref 13.0–17.0)
MCH: 30.8 pg (ref 26.0–34.0)
MCHC: 30.1 g/dL (ref 30.0–36.0)
MCV: 102.2 fL — ABNORMAL HIGH (ref 80.0–100.0)
Platelets: 66 10*3/uL — ABNORMAL LOW (ref 150–400)
RBC: 2.73 MIL/uL — ABNORMAL LOW (ref 4.22–5.81)
RDW: 18.2 % — ABNORMAL HIGH (ref 11.5–15.5)
WBC: 30.3 10*3/uL — ABNORMAL HIGH (ref 4.0–10.5)
nRBC: 34 % — ABNORMAL HIGH (ref 0.0–0.2)

## 2019-07-22 LAB — RENAL FUNCTION PANEL
Albumin: 1.8 g/dL — ABNORMAL LOW (ref 3.5–5.0)
Albumin: 1.9 g/dL — ABNORMAL LOW (ref 3.5–5.0)
Anion gap: 13 (ref 5–15)
Anion gap: 13 (ref 5–15)
BUN: 117 mg/dL — ABNORMAL HIGH (ref 8–23)
BUN: 85 mg/dL — ABNORMAL HIGH (ref 8–23)
CO2: 20 mmol/L — ABNORMAL LOW (ref 22–32)
CO2: 19 mmol/L — ABNORMAL LOW (ref 22–32)
Calcium: 7.7 mg/dL — ABNORMAL LOW (ref 8.9–10.3)
Calcium: 7.4 mg/dL — ABNORMAL LOW (ref 8.9–10.3)
Chloride: 111 mmol/L (ref 98–111)
Chloride: 109 mmol/L (ref 98–111)
Creatinine, Ser: 3.48 mg/dL — ABNORMAL HIGH (ref 0.61–1.24)
Creatinine, Ser: 2.76 mg/dL — ABNORMAL HIGH (ref 0.61–1.24)
GFR calc Af Amer: 19 mL/min — ABNORMAL LOW (ref >60)
GFR calc Af Amer: 25 mL/min — ABNORMAL LOW (ref >60)
GFR calc non Af Amer: 16 mL/min — ABNORMAL LOW (ref >60)
GFR calc non Af Amer: 21 mL/min — ABNORMAL LOW (ref >60)
Glucose, Bld: 183 mg/dL — ABNORMAL HIGH (ref 70–99)
Glucose, Bld: 85 mg/dL (ref 70–99)
Phosphorus: 5.1 mg/dL — ABNORMAL HIGH (ref 2.5–4.6)
Phosphorus: 4.2 mg/dL (ref 2.5–4.6)
Potassium: 4.3 mmol/L (ref 3.5–5.1)
Potassium: 4.5 mmol/L (ref 3.5–5.1)
Sodium: 144 mmol/L (ref 135–145)
Sodium: 141 mmol/L (ref 135–145)

## 2019-07-22 LAB — GLUCOSE, CAPILLARY
Glucose-Capillary: 104 mg/dL — ABNORMAL HIGH (ref 70–99)
Glucose-Capillary: 143 mg/dL — ABNORMAL HIGH (ref 70–99)
Glucose-Capillary: 167 mg/dL — ABNORMAL HIGH (ref 70–99)
Glucose-Capillary: 170 mg/dL — ABNORMAL HIGH (ref 70–99)
Glucose-Capillary: 97 mg/dL (ref 70–99)
Glucose-Capillary: 99 mg/dL (ref 70–99)

## 2019-07-22 LAB — BPAM PLATELET PHERESIS
Blood Product Expiration Date: 202012122359
Blood Product Expiration Date: 202012122359
ISSUE DATE / TIME: 202012101757
ISSUE DATE / TIME: 202012101821
Unit Type and Rh: 5100
Unit Type and Rh: 6200

## 2019-07-22 LAB — PREPARE PLATELET PHERESIS
Unit division: 0
Unit division: 0

## 2019-07-22 LAB — MAGNESIUM: Magnesium: 2.3 mg/dL (ref 1.7–2.4)

## 2019-07-22 MED ORDER — SODIUM CHLORIDE 0.9 % IV SOLN: INTRAVENOUS | Status: DC | PRN

## 2019-07-22 NOTE — Progress Notes (Signed)
Spent time talking to his son Deno Etienne who appears to comprehend that eventually his dad may not make it.  However he wanted me to talk to his cousin Jeanie Cooks was hospitalized in [place].  I did call him and tried to explain to him regarding the futility of care, I am not sure whether family is being fed appropriate directions are not I had a hard time convincing him that withdrawal of care is the best option.I'll continue to make attempts and tried to support the family in whatever addition to make.  His son have agreed to make patient DO NOT RESUSCITATE, but after discussions with his cousin, Jeanie Cooks, he told me to not put orders.  Will wait.   Adrian Prows, MD, [facility] 07/22/2019, 2:10 PM [facility] [place] Pager: [phone] Office: [phone] If no answer Cell [phone]

## 2019-07-22 NOTE — Plan of Care (Signed)
Discussed plan of care with pt's son, Deno Etienne. Discussed starting CRRT tonight, explaining what it was and it's purpose. Explained that pt is on high doses of multiple vasopressors with increasing dose needs, which is not a good sign overall, especially with all the other things the pt has going on. He voiced understanding. After he talked these things over with a friend in the medical field, he said he understood that the pt's condition is very serious and that pt is unlikely to survive this admission.  His main concern at this moment is that the pt's wife be able to visit with the pt. I told him I would discuss this with the team to see if we can make this happen given the situation.

## 2019-07-22 NOTE — Progress Notes (Signed)
Patient ID: Lucas Price, male   DOB: [birth year], 78 y.o.   MRN: [id number] [facility] Progress Note   Assessment/ Plan:   1. Acute kidney Injury on chronic kidney disease stage III: Likely ATN from cardiogenic shock, started on CRRT yesterday for efforts at regulating multiple metabolic abnormalities/azotemia and effort at ultrafiltration.  Remains significantly volume overloaded.  We will continue CRRT at this time with impending decision for transitioning to comfort care measures/palliative care. 2.  Acute decompensated right heart failure: On CRRT with significant volume excess, efforts underway to volume unloading. 3.  Central cord syndrome: Status post decompression and repair of Chance fracture.  Remains in cervical collar. 4.  Ventilator dependent respiratory failure: Currently unable to wean, significant encephalopathy.  Subjective:   Without acute changes overnight, no clinical improvement.   Objective:   BP (!) 127/57   Pulse 88   Temp (!) 96.6 F (35.9 C)   Resp (!) 26   Ht _0  (1.6 m)   Wt 99.5 kg   SpO2 100%   BMI 38.86 kg/m   Intake/Output Summary (Last 24 hours) at 07/22/2019 0817 Last data filed at 07/22/2019 0800 Gross per 24 hour  Intake 8056.98 ml  Output 3092 ml  Net 4964.98 ml   Weight change: 2.7 kg  Physical Exam: Gen: Intubated, unresponsive CVS: Pulse regular rhythm, normal rate, S1 and S2 normal Resp: Anteriorly clear to auscultation, no rales/rhonchi-on ventilator Abd: Soft, obese, nontender.  3-4+ abdominal wall edema Ext: 4+ anasarca, SCDs in situ.  Imaging: CT THORACIC SPINE WO CONTRAST  Result Date: 07/20/2019 CLINICAL DATA:  Status post fusions at C5-T1 and T10-L3. New sensory deficits in the lower extremities. Recent C5-6 fractures and L1 fractures. EXAM: CT THORACIC AND LUMBAR SPINE WITHOUT CONTRAST TECHNIQUE: Multidetector CT imaging of the thoracic and lumbar spine was performed without contrast. Multiplanar CT image  reconstructions were also generated. COMPARISON:  MRIs of the cervical and lumbar spine dated 08/04/2019 and CT scan of the lumbar spine dated 07/14/2019 FINDINGS: CT THORACIC SPINE FINDINGS Alignment: Normal. Vertebrae: The spine is fused from T2 through T5, likely congenital from T2-T4 and due to anterior osteophytes at T4-5. Osteophytes also fuse the vertebrae from T6 through T12. There is also posterior surgical fusion from T10 through T12. Paraspinal and other soft tissues: There are small bilateral pleural effusions, right greater than left with compressive atelectasis in the lower lobes. Aortic atherosclerosis. Disc levels: The discs from T1-2 through T11-12 demonstrate no extrusions or protrusions. Asymmetric endplate osteophytes at T9-10 extend to the right with slight compression of the right side of the thecal sac. Pedicle screws at T10-11 and T11-12 appear in good position with no evidence of loosening. 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 Alignment: Normal. Vertebrae: The patient has undergone posterior fusion from T10-L1. The hardware appears in excellent position. No other change since the prior study. Again noted is the chance fracture through the T12-L1 level. There is no visible protrusion of bone or disc material into the spinal canal at the level of the fracture. No visible epidural hematoma. Paraspinal and other soft tissues: No acute abnormalities of the paraspinal soft tissues in the abdomen or pelvis. Aortic atherosclerosis. Foley catheter in the bladder. Disc levels: T12-L1: The previously described fracture extends through the disc space and vertebral endplates and spinous process of L1. No paraspinal hematoma or appreciable disc protrusion. L1-2: Chronic disc space narrowing with small broad-based disc bulge with accompanying osteophytes. L2-3: Disc  space narrowing. Small disc protrusion to the right of midline, unchanged. L3-4: Disc space  narrowing. Broad-based disc protrusion with accompanying osteophytes, unchanged. Moderate central canal stenosis. L4-5: Chronic disc space narrowing with a broad-based central disc protrusion with accompanying osteophytes with compression of thecal sac with hypertrophy of the ligamentum flavum and facet joints combining to create the spinal stenosis. L5-S1: Congenital fusion.  Vestigial disc. IMPRESSION: CT THORACIC SPINE IMPRESSION No acute abnormalities of the thoracic spine. Most of the thoracic spine is either congenitally or degenerative leaf used as described. CT LUMBAR SPINE IMPRESSION Interval posterior fusion from T10 to L3 with stabilization of the Chance fracture of T12-L1. No epidural hematoma. 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 Electronically Signed   By: Lorriane Shire M.D.   On: 07/20/2019 10:26   CT LUMBAR SPINE WO CONTRAST  Result Date: 07/20/2019 CLINICAL DATA:  Status post fusions at C5-T1 and T10-L3. New sensory deficits in the lower extremities. Recent C5-6 fractures and L1 fractures. EXAM: CT THORACIC AND LUMBAR SPINE WITHOUT CONTRAST TECHNIQUE: Multidetector CT imaging of the thoracic and lumbar spine was performed without contrast. Multiplanar CT image reconstructions were also generated. COMPARISON:  MRIs of the cervical and lumbar spine dated 08/08/2019 and CT scan of the lumbar spine dated 07/14/2019 FINDINGS: CT THORACIC SPINE FINDINGS Alignment: Normal. Vertebrae: The spine is fused from T2 through T5, likely congenital from T2-T4 and due to anterior osteophytes at T4-5. Osteophytes also fuse the vertebrae from T6 through T12. There is also posterior surgical fusion from T10 through T12. Paraspinal and other soft tissues: There are small bilateral pleural effusions, right greater than left with compressive atelectasis in the lower lobes. Aortic atherosclerosis. Disc levels:  The discs from T1-2 through T11-12 demonstrate no extrusions or protrusions. Asymmetric endplate osteophytes at T9-10 extend to the right with slight compression of the right side of the thecal sac. Pedicle screws at T10-11 and T11-12 appear in good position with no evidence of loosening. 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 Alignment: Normal. Vertebrae: The patient has undergone posterior fusion from T10-L1. The hardware appears in excellent position. No other change since the prior study. Again noted is the chance fracture through the T12-L1 level. There is no visible protrusion of bone or disc material into the spinal canal at the level of the fracture. No visible epidural hematoma. Paraspinal and other soft tissues: No acute abnormalities of the paraspinal soft tissues in the abdomen or pelvis. Aortic atherosclerosis. Foley catheter in the bladder. Disc levels: T12-L1: The previously described fracture extends through the disc space and vertebral endplates and spinous process of L1. No paraspinal hematoma or appreciable disc protrusion. L1-2: Chronic disc space narrowing with small broad-based disc bulge with accompanying osteophytes. L2-3: Disc space narrowing. Small disc protrusion to the right of midline, unchanged. L3-4: Disc space narrowing. Broad-based disc protrusion with accompanying osteophytes, unchanged. Moderate central canal stenosis. L4-5: Chronic disc space narrowing with a broad-based central disc protrusion with accompanying osteophytes with compression of thecal sac with hypertrophy of the ligamentum flavum and facet joints combining to create the spinal stenosis. L5-S1: Congenital fusion.  Vestigial disc. IMPRESSION: CT THORACIC SPINE IMPRESSION No acute abnormalities of the thoracic spine. Most of the thoracic spine is either congenitally or degenerative leaf used as described. CT LUMBAR SPINE IMPRESSION Interval posterior fusion from T10 to L3  with stabilization of the Chance fracture of  T12-L1. No epidural hematoma. 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 Electronically Signed   By: Lorriane Shire M.D.   On: 07/20/2019 10:26   DG CHEST PORT 1 VIEW  Result Date: 07/21/2019 CLINICAL DATA:  Status post placement of a dialysis catheter. EXAM: PORTABLE CHEST 1 VIEW COMPARISON:  July 19, 2019 FINDINGS: There has been interval placement of a right subclavian dialysis catheter. The tip projects over the right brachiocephalic vein. There is a right-sided PICC line in place with tip likely terminating near the mid to distal SVC. There is a Swan-Ganz catheter in place with tip projecting over the main right pulmonary artery. There is no pneumothorax. The heart size is unchanged. Bilateral pleural effusions and bilateral airspace opacities are again noted with no significant interval change. The endotracheal tube terminates approximately 6.8 cm above the carina. The enteric tube extends below the left hemidiaphragm. IMPRESSION: 1. Lines and tubes as above.  No pneumothorax. 2. Otherwise, no significant interval change. There are persistent bilateral pleural effusions and bibasilar airspace opacities similar to prior study. Electronically Signed   By: Constance Holster M.D.   On: 07/21/2019 19:28    Labs: BMET Recent Labs  Lab 07/17/19 0511 07/17/19 1337 07/17/19 1713 07/18/19 0456 07/18/19 1602 07/19/19 0420 07/19/19 1639 07/20/19 0508 07/21/19 0432 07/22/19 0324 07/22/19 0540 07/22/19 0556  NA 148*  --   --  148*  --  148* 148* 148* 146* 144 141 141  K 4.2  --   --  4.6  --  4.6 4.1 4.2 4.5 4.3 4.3 4.3  CL 117*  --   --  119*  --  115* 118* 113* 114* 111  --   --   CO2 19*  --   --  19*  --  20* 19* 19* 17* 20*  --   --   GLUCOSE 153*  --   --  358*  --  318* 215* 214* 177* 183*  --   --   BUN 65*  --   --  81*  --  102* 116*  118* 133* 117*  --   --   CREATININE 2.14*  --   --  2.72*  --  3.33* 3.59* 3.72* 4.01* 3.48*  --   --   CALCIUM 7.7*  --   --  7.6*  --  7.6* 7.7* 7.7* 7.6* 7.7*  --   --   PHOS  --  4.2 4.4 4.1 4.2  --   --   --   --  5.1*  --   --    CBC Recent Labs  Lab 07/18/19 0456 07/19/19 0420 07/21/19 0432 07/22/19 0324 07/22/19 0540 07/22/19 0556  WBC 29.0* 24.3* 28.1* 30.3*  --   --   HGB 10.3* 9.4* 8.8* 8.4* 8.5* 8.8*  HCT 33.3* 31.6* 28.5* 27.9* 25.0* 26.0*  MCV 103.4* 105.3* 102.5* 102.2*  --   --   PLT 121* 88* 47* 66*  --   --     Medications:    . sodium chloride   Intravenous Once  . arformoterol  15 mcg Nebulization BID  . atorvastatin  10 mg Per Tube Daily  . budesonide (PULMICORT) nebulizer solution  0.25 mg Nebulization BID  . calcium carbonate  1 tablet Per Tube Daily  . chlorhexidine gluconate (MEDLINE KIT)  15 mL Mouth Rinse BID  . Chlorhexidine Gluconate Cloth  6 each  Topical Daily  . cholecalciferol  5,000 Units Per Tube Daily  . clonazepam  0.25 mg Per Tube BID  . docusate  100 mg Per Tube BID  . dorzolamide  1 drop Both Eyes [id number]  . feeding supplement (PRO-STAT SUGAR FREE 64)  30 mL Per Tube BID  . free water  100 mL Per Tube Q8H  . insulin aspart  0-20 Units Subcutaneous Q4H  . insulin aspart  5 Units Subcutaneous Q4H  . insulin detemir  20 Units Subcutaneous BID  . ipratropium  0.5 mg Nebulization Q6H  . ketorolac  1 drop Both Eyes TID  . latanoprost  1 drop Both Eyes QHS  . mouth rinse  15 mL Mouth Rinse 10 times per day  . midazolam  1 mg Intravenous Once  . pantoprazole sodium  40 mg Per Tube Daily  . polyethylene glycol  17 g Per Tube Daily  . polyvinyl alcohol  1 drop Both Eyes Q4H  . sodium chloride flush  10-40 mL Intracatheter Q12H   Elmarie Shiley, MD 07/22/2019, 8:17 AM

## 2019-07-22 NOTE — Progress Notes (Addendum)
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  12/3 Hypotensive, PCCM consult.  12/4 more hypoxic. Requiring HFO2 at 15 liters.  went back to OR  for Lumbar 3 fusion of unstable lumbar fracture. Returned to ICU on vent  12/5 weaning but lower Vts, upper extremity strength seems improved.  12/6: Extubated. Was initially awake. Alert. Following commands and in no distress. Then became suddenly unresponsive, acutely hypotensive and began to brady down. Was emergently re-intubated. Working dx at this point raising concern for sig cardiac component  12/7: PA catheter placement showed significant pulmonary hypertension 12/10: Started hemodialysis  Consults:  Cardiology, heart failure Neurosurgery, nephrology  Procedures:  PA catheter placed 12/7: On NE 50  CVP 14 PA 61/30 (44) PCWP 12 SVR 877 PVR 8.0 WU Thermo CO/CI = 4.0/2.1  Significant Diagnostic Tests:  Echocardiogram on 12/7 demonstrates elevated right atrial pressures, estimated RVSP is 102 mmHg, the RV apex is akinetic and the left ventricle systolic ejection fraction is 60 to 65%.  Lower extremity duplex 12/8-no pulmonary embolism  Micro Data:  Urinalysis on 12 7 with pyuria, bacteria present. Q aspirate 12/8 consistent with normal respiratory flora MRSA PCR on 12/4 is negative. Blood cultures  12/7 negative COVID-19 negative on 12/1  Antimicrobials:  Azithromycin 12/2-12/7 Ceftriaxone 12/2-12/7 Cefepime 12/7  Vancomycin 12/6 >> 12/10 Piperacillin tazobactam 12/7 >>  Interim history/subjective:  Currently on CVVH Continues on nor epi, vaso, epi and lasix drip  Objective   Blood pressure (!) 117/55, pulse 88, temperature (!) 96.4 F (35.8 C), resp. rate (!) 26, height 5\' 3"  (1.6 m), weight 99.5 kg, SpO2 100 %. PAP: (63-88)/(33-44) 81/38 CVP:  [12 mmHg-59 mmHg] 17 mmHg  Vent Mode: PRVC FiO2 (%):  [40 %-60 %] 60 % Set Rate:  [26 bmp] 26 bmp Vt Set:  [450 mL] 450 mL PEEP:  [5 cmH20] 5 cmH20 Plateau Pressure:  [21 cmH20-26 cmH20] 23 cmH20   Intake/Output Summary (Last 24 hours) at 07/22/2019 0933 Last data filed at 07/22/2019 0900 Gross per 24 hour  Intake 8622.51 ml  Output 3474 ml  Net 5148.51 ml   Filed Weights   07/20/19 0500 07/21/19 0404 07/22/19 0500  Weight: 95.5 kg 96.8 kg 99.5 kg    Examination: Gen:      No acute distress HEENT:  EOMI, sclera anicteric Neck:     No masses; no thyromegaly, ETT Lungs:    Clear to auscultation bilaterally; normal respiratory effort CV:         Regular rate and rhythm; no murmurs Abd:      + bowel sounds; soft, non-tender; no palpable masses, no distension Ext:    No edema; adequate peripheral perfusion Skin:      Warm and dry; no rash Neuro: Sedated, unresponsive  Lines: Right PA catheter, sided PICC line, Foley catheter, OG tube, endotracheal tube, left radial arterial line, right subclavian HD catheter  Assessment & Plan:  78 year old gentleman with insulin-dependent diabetes, coronary artery disease which has not been intervened on, and CKD stage III who presents initially with central cord syndrome now found to have mixed shock and respiratory failure.  Possible mixed neurogenic cardiogenic shock with RV failure Possible PE, no DVT on duplex A. fib with RVR Coronary artery disease Continue pressors, inhaled  nitric oxide Continue amiodarone Anticoagulation on hold due to recent spine surgery.  AKI Low urine output on Lasix drip Continue CVVH  Respiratory failure Continue vent support for now. On empiric Zosyn Not a candidate for pressure support weans at current state He is not a good candidate for tracheostomy.  I am not sure he will survive this illness as he remains critically ill on pressors  Goals of care Ongoing discussion with primary team, cardiology and family. Agree that prognosis is extremely poor and recommended DNR status and consideration of comfort measures  Best practice:  Diet: Tube feeds Pain/Anxiety/Delirium protocol (if indicated): Fentanyl ordered VAP protocol (if indicated): VAP is ordered DVT prophylaxis: SCDs GI prophylaxis: PPI Glucose control: SSI, Levemir Foley: yes Mobility: Bedrest Code Status: Full code Family Communication: Per primary team and cardiology Disposition: ICU  Labs   BUN/creatinine 117/3.48, WBC 30.3, hemoglobin 8.8 Chest x-ray 12/11-support apparatus in stable portion.  Bilateral pleural effusion with lower lobe consolidation  Critical care time:    The patient is critically ill with multiple organ system failure and requires high complexity decision making for assessment and support, frequent evaluation and titration of therapies, advanced monitoring, review of radiographic studies and interpretation of complex data.   Critical Care Time devoted to patient care services, exclusive of separately billable procedures, described in this note is 35 minutes.   Marshell Garfinkel MD [facility] 07/22/2019, 9:33 AM

## 2019-07-22 NOTE — Progress Notes (Signed)
Advanced Heart Failure Rounding Note  PCP-Cardiologist: No primary care provider on file.   Subjective:     Remains on triple pressors and iNO. Un responsive on vent. Vent now up to 60%   CVVHD started yesterday but still + 5L  Swan #s CVP 19 PA 81/39 (55) Thermo 3.5/1.8   Objective:   Weight Range: 99.5 kg Body mass index is 38.86 kg/m.   Vital Signs:   Temp:  [96.1 F (35.6 C)-98.1 F (36.7 C)] 96.4 F (35.8 C) (12/11 0900) Pulse Rate:  [77-99] 88 (12/11 0900) Resp:  [14-30] 26 (12/11 0900) BP: (102-147)/(46-77) 117/55 (12/11 0800) SpO2:  [91 %-100 %] 100 % (12/11 0900) Arterial Line BP: (74-129)/(41-58) 89/58 (12/11 0900) FiO2 (%):  [40 %-60 %] 60 % (12/11 0800) Weight:  [99.5 kg] 99.5 kg (12/11 0500) Last BM Date: 07/11/19  Weight change: Filed Weights   07/20/19 0500 07/21/19 0404 07/22/19 0500  Weight: 95.5 kg 96.8 kg 99.5 kg    Intake/Output:   Intake/Output Summary (Last 24 hours) at 07/22/2019 0932 Last data filed at 07/22/2019 0900 Gross per 24 hour  Intake 8622.51 ml  Output 3474 ml  Net 5148.51 ml      Physical Exam   General:  Critically ill on vent  Unresponsive HEENT: normal + HEENT Neck: + cervical collar RIJ swan Cor: Regular  Lungs:coarse Abdomen: soft, nontender, ++ distended. No hepatosplenomegaly. No bruits or masses. Hypoactive bowel sounds. Extremities: no cyanosis, clubbing, rash, 4+ edema with blistering Neuro: unresponsive on vent    Telemetry   Sinus 80-90s Personally reviewed  EKG    N/a   Labs    CBC Recent Labs    07/21/19 0432 07/22/19 0324 07/22/19 0540 07/22/19 0556  WBC 28.1* 30.3*  --   --   HGB 8.8* 8.4* 8.5* 8.8*  HCT 28.5* 27.9* 25.0* 26.0*  MCV 102.5* 102.2*  --   --   PLT 47* 66*  --   --    Basic Metabolic Panel Recent Labs    07/21/19 0432 07/22/19 0324 07/22/19 0540 07/22/19 0556  NA 146* 144 141 141  K 4.5 4.3 4.3 4.3  CL 114* 111  --   --   CO2 17* 20*  --   --     GLUCOSE 177* 183*  --   --   BUN 133* 117*  --   --   CREATININE 4.01* 3.48*  --   --   CALCIUM 7.6* 7.7*  --   --   MG  --  2.3  --   --   PHOS  --  5.1*  --   --    Liver Function Tests Recent Labs    07/22/19 0324  ALBUMIN 1.8*   No results for input(s): LIPASE, AMYLASE in the last 72 hours. Cardiac Enzymes No results for input(s): CKTOTAL, CKMB, CKMBINDEX, TROPONINI in the last 72 hours.  BNP: BNP (last 3 results) No results for input(s): BNP in the last 8760 hours.  ProBNP (last 3 results) No results for input(s): PROBNP in the last 8760 hours.   D-Dimer No results for input(s): DDIMER in the last 72 hours. Hemoglobin A1C No results for input(s): HGBA1C in the last 72 hours. Fasting Lipid Panel No results for input(s): CHOL, HDL, LDLCALC, TRIG, CHOLHDL, LDLDIRECT in the last 72 hours. Thyroid Function Tests No results for input(s): TSH, T4TOTAL, T3FREE, THYROIDAB in the last 72 hours.  Invalid input(s): FREET3  Other results:   Imaging  DG Chest Port 1 View  Result Date: 07/22/2019 CLINICAL DATA:  78 year old male respiratory failure. Shock. Neurosurgery earlier this month for cervical spine, cervical spinal cord, and thoracolumbar junction spine injury after fall. EXAM: PORTABLE CHEST 1 VIEW COMPARISON:  Portable chest 07/21/2019 and earlier. FINDINGS: Portable AP semi upright view at 0544 hours. Stable endotracheal tube tip in good position between the level the clavicles and carina. Enteric tube courses to the abdomen, tip not included. Stable right IJ Swan-Ganz catheter. Stable right PICC line. Stable right subclavian approach dual lumen catheter. Cervical ACDF and lower thoracic spine hardware. Stable lung volumes and mediastinal contours. Combined airspace and veiling opacity in the lower lungs. Pulmonary vascularity in the upper lungs appears stable without overt edema. No pneumothorax. IMPRESSION: 1.  Stable lines and tubes. 2. Stable ventilation with  bilateral pleural effusions with superimposed lower lung collapse or consolidation. Electronically Signed   By: Genevie Ann M.D.   On: 07/22/2019 08:24   DG CHEST PORT 1 VIEW  Result Date: 07/21/2019 CLINICAL DATA:  Status post placement of a dialysis catheter. EXAM: PORTABLE CHEST 1 VIEW COMPARISON:  July 19, 2019 FINDINGS: There has been interval placement of a right subclavian dialysis catheter. The tip projects over the right brachiocephalic vein. There is a right-sided PICC line in place with tip likely terminating near the mid to distal SVC. There is a Swan-Ganz catheter in place with tip projecting over the main right pulmonary artery. There is no pneumothorax. The heart size is unchanged. Bilateral pleural effusions and bilateral airspace opacities are again noted with no significant interval change. The endotracheal tube terminates approximately 6.8 cm above the carina. The enteric tube extends below the left hemidiaphragm. IMPRESSION: 1. Lines and tubes as above.  No pneumothorax. 2. Otherwise, no significant interval change. There are persistent bilateral pleural effusions and bibasilar airspace opacities similar to prior study. Electronically Signed   By: Constance Holster M.D.   On: 07/21/2019 19:28     Medications:     Scheduled Medications: . sodium chloride   Intravenous Once  . arformoterol  15 mcg Nebulization BID  . atorvastatin  10 mg Per Tube Daily  . budesonide (PULMICORT) nebulizer solution  0.25 mg Nebulization BID  . calcium carbonate  1 tablet Per Tube Daily  . chlorhexidine gluconate (MEDLINE KIT)  15 mL Mouth Rinse BID  . Chlorhexidine Gluconate Cloth  6 each Topical Daily  . cholecalciferol  5,000 Units Per Tube Daily  . clonazepam  0.25 mg Per Tube BID  . docusate  100 mg Per Tube BID  . dorzolamide  1 drop Both Eyes [id number]  . feeding supplement (PRO-STAT SUGAR FREE 64)  30 mL Per Tube BID  . free water  100 mL Per Tube Q8H  . insulin aspart  0-20 Units  Subcutaneous Q4H  . insulin aspart  5 Units Subcutaneous Q4H  . insulin detemir  20 Units Subcutaneous BID  . ipratropium  0.5 mg Nebulization Q6H  . ketorolac  1 drop Both Eyes TID  . latanoprost  1 drop Both Eyes QHS  . mouth rinse  15 mL Mouth Rinse 10 times per day  . midazolam  1 mg Intravenous Once  . pantoprazole sodium  40 mg Per Tube Daily  . polyethylene glycol  17 g Per Tube Daily  . polyvinyl alcohol  1 drop Both Eyes Q4H  . sodium chloride flush  10-40 mL Intracatheter Q12H    Infusions: .  prismasol BGK 4/2.5 400 mL/hr  at 07/21/19 2200  .  prismasol BGK 4/2.5 200 mL/hr at 07/21/19 2200  . sodium chloride Stopped (07/18/19 1257)  . amiodarone 30 mg/hr (07/22/19 0900)  . epinephrine 10 mcg/min (07/22/19 0900)  . feeding supplement (VITAL AF 1.2 CAL) 1,000 mL (07/22/19 0825)  . fentaNYL infusion INTRAVENOUS 100 mcg/hr (07/22/19 0900)  . furosemide (LASIX) infusion Stopped (07/21/19 1759)  . norepinephrine (LEVOPHED) Adult infusion 60 mcg/min (07/22/19 0900)  . piperacillin-tazobactam 100 mL/hr at 07/22/19 0900  . prismasol BGK 4/2.5 1,500 mL/hr at 07/22/19 0802  . sodium chloride    . vasopressin (PITRESSIN) infusion - *FOR SHOCK* 0.03 Units/min (07/22/19 0900)    PRN Medications: Place/Maintain arterial line **AND** sodium chloride, acetaminophen **OR** acetaminophen, bisacodyl, fentaNYL, heparin, [DISCONTINUED] ondansetron **OR** ondansetron (ZOFRAN) IV, polyvinyl alcohol, sodium chloride, sodium chloride, sodium chloride flush    Assessment/Plan   1. 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 Acute on chronic hypoxic respiratory railure  - Extubated but required reintubation 07/17/19 -  Worse today vent now up to 60%  4. PAH with A/C Cor pulmonale - on inhaled NO. Swan numbers as above  5. A Fib RVR - Placed on amiodarone drip with conversion to Sinus Tach  - Continue amiodarone drip.   6. AKI on CKD Stage IIIb  - Creatinine baseline 1.8.  - Creatinine continues to rise 2.7>3.3> 3.7 > 4.01  . BUN 33 - Poor urine output despite inotrope support now uremic and volume overloaded - CVVHD started 12/10  Long talk with family today including son and nephew (who is hospitalist) yesterday. They wanted to continue to pursue aggressive care despite my urgings that this was an unsurvivable situation and we should move to comfort care.   CVVHD started yesterday with no significant improvement. In fact, he continues to deteriorate. The family has now spoken wth Renal and also another family friend who have reinforced futility on ongoing aggressive care. Will discuss comfort care again with them today. I d/w Dr. 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   CRITICAL CARE Performed by: Glori Bickers  Total critical care time: 45 minutes  Critical care time was exclusive of separately billable procedures and treating other patients.  Critical care was necessary to treat or prevent imminent or life-threatening deterioration.  Critical care was time spent personally by me (independent of midlevel providers or residents) on the following activities: development of treatment plan with patient and/or surrogate as well as nursing, discussions with consultants, evaluation of patient's response to treatment, examination of patient, obtaining history from patient or surrogate, ordering and performing treatments and interventions, ordering and review of laboratory studies, ordering and review of radiographic studies, pulse oximetry and re-evaluation of patient's condition.    Length of Stay: [place], MD  07/22/2019, 9:32 AM  Advanced  Heart Failure Team Pager [phone] (M-F; 7a - 4p)  Please contact [facility] for night-coverage after hours (4p -7a ) and weekends on [email or web address]

## 2019-07-22 NOTE — Progress Notes (Signed)
I have again extensively discussed with the patient's son and his relative who is a hospitalist, over the telephone and explained that he is not can survive the hospitalization.  I also recommended that he be considered for withdrawal of care.  The family appears to be now leaning towards making him comfort care but initially wanted to continue care for the next 4 to 5 days before they make a decision.  I have advised him that he probably will not survive for the next 4 to 5 days and best option is to withdraw the care and make him comfortable.  I called them again a few minutes later to rediscuss and to reconfirm that I was going to make him DNR as he was still full code.  At this point she is some, Prashant told me that he had an active discussion with the whole family and he will call me back and there thinking of withdrawal of care.  I tried to comfort the family, his wife is also a patient of mine.  Adrian Prows, MD, [facility] 07/22/2019, 10:04 AM [facility]. [place] Pager: [phone] Office: [phone] If no answer Cell [phone]

## 2019-07-22 NOTE — Plan of Care (Signed)
  Problem: Education: Goal: Knowledge of General Education information will improve Description: Including pain rating scale, medication(s)/side effects and non-pharmacologic comfort measures Outcome: Progressing   Problem: Health Behavior/Discharge Planning: Goal: Ability to manage health-related needs will improve Outcome: Progressing   Problem: Clinical Measurements: Goal: Ability to maintain clinical measurements within normal limits will improve Outcome: Progressing Goal: Will remain free from infection Outcome: Progressing Goal: Diagnostic test results will improve Outcome: Progressing Goal: Respiratory complications will improve Outcome: Progressing Goal: Cardiovascular complication will be avoided Outcome: Progressing   Problem: Activity: Goal: Risk for activity intolerance will decrease Outcome: Progressing   Problem: Nutrition: Goal: Adequate nutrition will be maintained Outcome: Progressing   Problem: Coping: Goal: Level of anxiety will decrease Outcome: Progressing   Problem: Elimination: Goal: Will not experience complications related to bowel motility Outcome: Progressing Goal: Will not experience complications related to urinary retention Outcome: Progressing   Problem: Pain Managment: Goal: General experience of comfort will improve Outcome: Progressing   Problem: Safety: Goal: Ability to remain free from injury will improve Outcome: Progressing   Problem: Skin Integrity: Goal: Risk for impaired skin integrity will decrease Outcome: Progressing   Problem: Education: Goal: Ability to verbalize activity precautions or restrictions will improve Outcome: Progressing Goal: Knowledge of the prescribed therapeutic regimen will improve Outcome: Progressing Goal: Understanding of discharge needs will improve Outcome: Progressing   Problem: Activity: Goal: Ability to avoid complications of mobility impairment will improve Outcome: Progressing Goal:  Ability to tolerate increased activity will improve Outcome: Progressing Goal: Will remain free from falls Outcome: Progressing   Problem: Bowel/Gastric: Goal: Gastrointestinal status for postoperative course will improve Outcome: Progressing   Problem: Clinical Measurements: Goal: Ability to maintain clinical measurements within normal limits will improve Outcome: Progressing Goal: Postoperative complications will be avoided or minimized Outcome: Progressing Goal: Diagnostic test results will improve Outcome: Progressing   Problem: Pain Management: Goal: Pain level will decrease Outcome: Progressing   Problem: Skin Integrity: Goal: Will show signs of wound healing Outcome: Progressing   Problem: Health Behavior/Discharge Planning: Goal: Identification of resources available to assist in meeting health care needs will improve Outcome: Progressing   Problem: Bladder/Genitourinary: Goal: Urinary functional status for postoperative course will improve Outcome: Progressing   Problem: Fluid Volume: Goal: Hemodynamic stability will improve Outcome: Progressing   Problem: Clinical Measurements: Goal: Diagnostic test results will improve Outcome: Progressing Goal: Signs and symptoms of infection will decrease Outcome: Progressing   Problem: Respiratory: Goal: Ability to maintain adequate ventilation will improve Outcome: Progressing   Problem: Education: Goal: Ability to demonstrate management of disease process will improve Outcome: Progressing Goal: Ability to verbalize understanding of medication therapies will improve Outcome: Progressing Goal: Individualized Educational Video(s) Outcome: Progressing   Problem: Activity: Goal: Capacity to carry out activities will improve Outcome: Progressing   Problem: Cardiac: Goal: Ability to achieve and maintain adequate cardiopulmonary perfusion will improve Outcome: Progressing

## 2019-07-22 NOTE — Progress Notes (Signed)
  NEUROSURGERY PROGRESS NOTE   Remains intubated Now unresponsive  EXAM:  BP (!) 127/57   Pulse 88   Temp (!) 96.8 F (36 C)   Resp (!) 26   Ht 5\' 3"  (1.6 m)   Wt 99.5 kg   SpO2 100%   BMI 38.86 kg/m   Intubated Unresponsive Pupils reactive No significant response to central pain, ?extensor posturing  IMPRESSION/PLAN 78 y.o. 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 Now unresponsive, multi organ failure. - Discussed with nursing that there is potential discussion about transition to comfort care. Would agree with comfort care approach.

## 2019-07-22 NOTE — Plan of Care (Signed)
Brief Plan of Care and progress note  The patient became acutely more hypotensive despite concomitant NE and epi gtts to SBP 70. RN increased Ne to 70 and immediately requested evaluation. I increased ceiling of NE gtt and arrived at bedside to discuss this acute decompensation with the patient's son. I expressed concern that progression of the patient's hypotension may be indicative of worsening shock. I expressed concern that if this is indicative of worsening MODS, it is likely that increasing the ceiling of pressors is unlikely to yield sustainable benefit.   The patient's son called the cousin (hospitalist in [place]). I provided these updates and expressed my concerns for possible further decline. All questions answered.   I approached the topic of Code Status with son and cousin. A decision was made to update the patient's code status to DNR.  We will continue CRRT. We will not increase further pressors or increase interventions.     Eliseo Gum MSN, AGACNP-BC [place] [phone] If no answer, [phone] 07/22/2019, 6:50 PM

## 2019-07-23 LAB — CULTURE, BLOOD (ROUTINE X 2)
Culture: NO GROWTH
Culture: NO GROWTH

## 2019-07-23 LAB — RENAL FUNCTION PANEL
Albumin: 1.8 g/dL — ABNORMAL LOW (ref 3.5–5.0)
Albumin: 1.6 g/dL — ABNORMAL LOW (ref 3.5–5.0)
Anion gap: 13 (ref 5–15)
Anion gap: 10 (ref 5–15)
BUN: 67 mg/dL — ABNORMAL HIGH (ref 8–23)
BUN: 55 mg/dL — ABNORMAL HIGH (ref 8–23)
CO2: 21 mmol/L — ABNORMAL LOW (ref 22–32)
CO2: 20 mmol/L — ABNORMAL LOW (ref 22–32)
Calcium: 7.3 mg/dL — ABNORMAL LOW (ref 8.9–10.3)
Calcium: 7.1 mg/dL — ABNORMAL LOW (ref 8.9–10.3)
Chloride: 106 mmol/L (ref 98–111)
Chloride: 108 mmol/L (ref 98–111)
Creatinine, Ser: 2.09 mg/dL — ABNORMAL HIGH (ref 0.61–1.24)
Creatinine, Ser: 1.73 mg/dL — ABNORMAL HIGH (ref 0.61–1.24)
GFR calc Af Amer: 35 mL/min — ABNORMAL LOW (ref >60)
GFR calc Af Amer: 43 mL/min — ABNORMAL LOW (ref >60)
GFR calc non Af Amer: 30 mL/min — ABNORMAL LOW (ref >60)
GFR calc non Af Amer: 38 mL/min — ABNORMAL LOW (ref >60)
Glucose, Bld: 184 mg/dL — ABNORMAL HIGH (ref 70–99)
Glucose, Bld: 158 mg/dL — ABNORMAL HIGH (ref 70–99)
Phosphorus: 3.6 mg/dL (ref 2.5–4.6)
Phosphorus: 3.2 mg/dL (ref 2.5–4.6)
Potassium: 4.4 mmol/L (ref 3.5–5.1)
Potassium: 4.3 mmol/L (ref 3.5–5.1)
Sodium: 140 mmol/L (ref 135–145)
Sodium: 138 mmol/L (ref 135–145)

## 2019-07-23 LAB — GLUCOSE, CAPILLARY
Glucose-Capillary: 181 mg/dL — ABNORMAL HIGH (ref 70–99)
Glucose-Capillary: 101 mg/dL — ABNORMAL HIGH (ref 70–99)
Glucose-Capillary: 59 mg/dL — ABNORMAL LOW (ref 70–99)
Glucose-Capillary: 104 mg/dL — ABNORMAL HIGH (ref 70–99)
Glucose-Capillary: 154 mg/dL — ABNORMAL HIGH (ref 70–99)
Glucose-Capillary: 113 mg/dL — ABNORMAL HIGH (ref 70–99)
Glucose-Capillary: 98 mg/dL (ref 70–99)

## 2019-07-23 LAB — MAGNESIUM: Magnesium: 2.3 mg/dL (ref 1.7–2.4)

## 2019-07-23 NOTE — Progress Notes (Signed)
1640: Spoke with Dr. Vaughan Browner concerning patients CT scan orders. Pt is currently tolerating hourly fluid removal rate greater than hourly intake. Will wait to obtain CT scan during next CRRT kidney change per verbal orders Dr. Vaughan Browner.

## 2019-07-23 NOTE — Progress Notes (Signed)
Advanced Heart Failure Rounding Note  PCP-Cardiologist: No primary care provider on file.   Subjective:     Remains on triple pressors (VP 0.03, NE 70 and Epi 10)  + iNO at 15ppm.   Remains on vent at 50% (down from 60%). On CVVHD. Able to pull -1.2L yesterday but now unable to pull due to low BP  More alert today. Able to follow commands. Will close eyes. Lift forearms a little. No movement in LEs except for twitch of R 3rd toe.   Swan #s CVP 18 PA 74/39 (51) Thermo 4.1/2.1   Objective:   Weight Range: 98.2 kg Body mass index is 38.35 kg/m.   Vital Signs:   Temp:  [96.8 F (36 C)-99 F (37.2 C)] 98.2 F (36.8 C) (12/12 1315) Pulse Rate:  [84-103] 91 (12/12 1315) Resp:  [20-29] 24 (12/12 1315) BP: (104-113)/(48-55) 113/55 (12/12 0407) SpO2:  [99 %-100 %] 99 % (12/12 1315) Arterial Line BP: (77-154)/(43-72) 99/44 (12/12 1315) FiO2 (%):  [50 %-60 %] 50 % (12/12 1308) Weight:  [98.2 kg] 98.2 kg (12/12 0500) Last BM Date: 07/11/19  Weight change: Filed Weights   07/21/19 0404 07/22/19 0500 07/23/19 0500  Weight: 96.8 kg 99.5 kg 98.2 kg    Intake/Output:   Intake/Output Summary (Last 24 hours) at 07/23/2019 1327 Last data filed at 07/23/2019 1100 Gross per 24 hour  Intake 4738.27 ml  Output 6127 ml  Net -1388.73 ml      Physical Exam   General:  Critically ill on vent  Awake will follow commands HEENT: normal + ETT Neck: + cervical collar. RIJ swan Cor: PMI nondisplaced. Regular rate & rhythm. No rubs, gallops or murmurs. Lungs: coarse anteriorly Abdomen: soft, nontender, ++ distended  Good bowel sounds. Extremities: no cyanosis, clubbing, rash, 4+ edema (tight) with weeping areas Neuro: alert follows commands Will close eyes. Lift forearms a little. No movement in LEs except for twitch of R 3rd toe.   Telemetry   Sinus 90s Personally reviewed  EKG    N/a   Labs    CBC Recent Labs    07/21/19 0432 07/22/19 0324 07/22/19 0540  07/22/19 0556  WBC 28.1* 30.3*  --   --   HGB 8.8* 8.4* 8.5* 8.8*  HCT 28.5* 27.9* 25.0* 26.0*  MCV 102.5* 102.2*  --   --   PLT 47* 66*  --   --    Basic Metabolic Panel Recent Labs    07/22/19 0324 07/22/19 1529 07/23/19 0404  NA 144 141 140  K 4.3 4.5 4.4  CL 111 109 106  CO2 20* 19* 21*  GLUCOSE 183* 85 184*  BUN 117* 85* 67*  CREATININE 3.48* 2.76* 2.09*  CALCIUM 7.7* 7.4* 7.3*  MG 2.3  --  2.3  PHOS 5.1* 4.2 3.6   Liver Function Tests Recent Labs    07/22/19 1529 07/23/19 0404  ALBUMIN 1.9* 1.8*   No results for input(s): LIPASE, AMYLASE in the last 72 hours. Cardiac Enzymes No results for input(s): CKTOTAL, CKMB, CKMBINDEX, TROPONINI in the last 72 hours.  BNP: BNP (last 3 results) No results for input(s): BNP in the last 8760 hours.  ProBNP (last 3 results) No results for input(s): PROBNP in the last 8760 hours.   D-Dimer No results for input(s): DDIMER in the last 72 hours. Hemoglobin A1C No results for input(s): HGBA1C in the last 72 hours. Fasting Lipid Panel No results for input(s): CHOL, HDL, LDLCALC, TRIG, CHOLHDL, LDLDIRECT in the  last 72 hours. Thyroid Function Tests No results for input(s): TSH, T4TOTAL, T3FREE, THYROIDAB in the last 72 hours.  Invalid input(s): FREET3  Other results:   Imaging    No results found.   Medications:     Scheduled Medications: . sodium chloride   Intravenous Once  . arformoterol  15 mcg Nebulization BID  . atorvastatin  10 mg Per Tube Daily  . budesonide (PULMICORT) nebulizer solution  0.25 mg Nebulization BID  . calcium carbonate  1 tablet Per Tube Daily  . chlorhexidine gluconate (MEDLINE KIT)  15 mL Mouth Rinse BID  . Chlorhexidine Gluconate Cloth  6 each Topical Daily  . cholecalciferol  5,000 Units Per Tube Daily  . clonazepam  0.25 mg Per Tube BID  . docusate  100 mg Per Tube BID  . dorzolamide  1 drop Both Eyes BH-q7a  . feeding supplement (PRO-STAT SUGAR FREE 64)  30 mL Per Tube BID   . free water  100 mL Per Tube Q8H  . insulin aspart  0-20 Units Subcutaneous Q4H  . insulin aspart  5 Units Subcutaneous Q4H  . insulin detemir  20 Units Subcutaneous BID  . ipratropium  0.5 mg Nebulization Q6H  . ketorolac  1 drop Both Eyes TID  . latanoprost  1 drop Both Eyes QHS  . mouth rinse  15 mL Mouth Rinse 10 times per day  . midazolam  1 mg Intravenous Once  . pantoprazole sodium  40 mg Per Tube Daily  . polyethylene glycol  17 g Per Tube Daily  . polyvinyl alcohol  1 drop Both Eyes Q4H  . sodium chloride flush  10-40 mL Intracatheter Q12H    Infusions: .  prismasol BGK 4/2.5 400 mL/hr at 07/22/19 2326  .  prismasol BGK 4/2.5 200 mL/hr at 07/23/19 0913  . sodium chloride Stopped (07/18/19 1257)  . amiodarone 30 mg/hr (07/23/19 1100)  . epinephrine 10 mcg/min (07/23/19 1100)  . feeding supplement (VITAL AF 1.2 CAL) 1,000 mL (07/23/19 0419)  . fentaNYL infusion INTRAVENOUS 25 mcg/hr (07/23/19 1100)  . norepinephrine (LEVOPHED) Adult infusion 70 mcg/min (07/23/19 1100)  . piperacillin-tazobactam 3.375 g (07/23/19 1302)  . prismasol BGK 4/2.5 1,500 mL/hr at 07/23/19 1244  . sodium chloride    . vasopressin (PITRESSIN) infusion - *FOR SHOCK* 0.03 Units/min (07/23/19 1100)    PRN Medications: Place/Maintain arterial line **AND** sodium chloride, acetaminophen **OR** acetaminophen, bisacodyl, fentaNYL, heparin, [DISCONTINUED] ondansetron **OR** ondansetron (ZOFRAN) IV, polyvinyl alcohol, sodium chloride, sodium chloride, sodium chloride flush    Assessment/Plan   1. 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 Shock  - Combined neurogenic & cardiogenic shock (cor pulmonale) - On high dose pressors and iNO  - Cardiac output and hemodynamics slightly improved overnight but on nearly max doses pressors and iNO.   3. Acute on chronic hypoxic respiratory railure  - Extubated  but required reintubation 07/17/19 - Down from FiO2 60%-> 50% today with PEEP 10. Will need trach if continued care desired.   4. PAH with A/C Cor pulmonale - on inhaled NO. Swan numbers as above  5. A Fib RVR - Placed on amiodarone drip with conversion to Sinus Tach  - Remains in NSR. Continue amiodarone drip.   6. AKI on CKD Stage IIIb  - Creatinine baseline 1.8.  - CVVHD started 12/10 - Mental status improved with clearance of uremia. Now with massive volume overload but ability  to remove fluid hampered by hypotension.   7. 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 He is starting to have significant skin breakdown due to volume overload which we are unable to address sufficiently due to hypotension. I explained that I feel that we have crossed the line of medical futility and we should strongly consider moving him to comfort care.   Satish continues to focus on the isolated details of his uncle's care including: vent settings, cardiac output, whether his lumbar surgery ws necessary and when we can do a CT of his cervical spine to exclude hematoma. He is insistent that multiple family members want to continue with ongoing aggressive care. We will honor the family's wishes and continue to titrate drips and provide extremely aggressive care but if it appears Dr. Doyne Keel is suffering, I will continue to advocate for his comfort. He is currently too unstable to transport to Radiology for CT. He is DNR  CRITICAL CARE Performed by: Glori Bickers  Total critical care time: 60 minutes  Critical care time was exclusive  of separately billable procedures and treating other patients.  Critical care was necessary to treat or prevent imminent or life-threatening deterioration.  Critical care was time spent personally by me (independent of midlevel providers or residents) on the following activities: development of treatment plan with patient and/or surrogate as well as nursing, discussions with consultants, evaluation of patient's response to treatment, examination of patient, obtaining history from patient or surrogate, ordering and performing treatments and interventions, ordering and review of laboratory studies, ordering and review of radiographic studies, pulse oximetry and re-evaluation of patient's condition.    Length of Stay: [place], MD  07/23/2019, 1:27 PM  Advanced Heart Failure Team Pager [phone] (M-F; [place])  Please contact [facility] for night-coverage after hours (4p -7a ) and weekends on [email or web address]

## 2019-07-23 NOTE — Progress Notes (Signed)
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  12/3 Hypotensive, PCCM consult.  12/4 more hypoxic. Requiring HFO2 at 15 liters.  went back to OR  for Lumbar 3 fusion of unstable lumbar fracture. Returned to ICU on vent  12/5 weaning but lower Vts, upper extremity strength seems improved.  12/6: Extubated. Was initially awake. Alert. Following commands and in no distress. Then became suddenly unresponsive, acutely hypotensive and began to brady down. Was emergently re-intubated. Working dx at this point raising concern for sig cardiac component  12/7: PA catheter placement showed significant pulmonary hypertension 12/10: Started hemodialysis 12/11:  Worsening shock, Made DNR  Consults:  Cardiology, heart failure Neurosurgery, nephrology  Procedures:  PA catheter placed 12/7: On NE 50  CVP 14 PA 61/30 (44) PCWP 12 SVR 877 PVR 8.0 WU Thermo CO/CI = 4.0/2.1  Significant Diagnostic Tests:  Echocardiogram on 12/7 demonstrates elevated right atrial pressures, estimated RVSP is 102 mmHg, the RV apex is akinetic and the left ventricle systolic ejection fraction is 60 to 65%.  Lower extremity duplex 12/8-no DVT  Micro Data:  Urinalysis on 12 7 with pyuria, bacteria present. Q aspirate 12/8 consistent with normal respiratory flora MRSA PCR on 12/4 is  negative. Blood cultures 12/7 negative COVID-19 negative on 12/1  Antimicrobials:  Azithromycin 12/2-12/7 Ceftriaxone 12/2-12/7 Cefepime 12/7  Vancomycin 12/6 >> 12/10 Piperacillin tazobactam 12/7 >>  Interim history/subjective:  Continues on CVVH.  Increasing pressor requirements.  Objective   Blood pressure (!) 113/55, pulse 89, temperature (!) 97.2 F (36.2 C), resp. rate (!) 26, height 5\' 3"  (1.6 m), weight 98.2 kg, SpO2 100 %. PAP: (64-85)/(37-45) 71/40 CVP:  [12 mmHg-19 mmHg] 12 mmHg CO:  [4.3 L/min] 4.3 L/min CI:  [2.2 L/min/m2] 2.2 L/min/m2  Vent Mode: PRVC FiO2 (%):  [60 %] 60 % Set Rate:  [26 bmp] 26 bmp Vt Set:  [450 mL] 450 mL PEEP:  [10 cmH20] 10 cmH20 Plateau Pressure:  [23 cmH20-26 cmH20] 24 cmH20   Intake/Output Summary (Last 24 hours) at 07/23/2019 7017 Last data filed at 07/23/2019 0500 Gross per 24 hour  Intake 5073.6 ml  Output 7822 ml  Net -2748.4 ml   Filed Weights   07/21/19 0404 07/22/19 0500 07/23/19 0500  Weight: 96.8 kg 99.5 kg 98.2 kg    Examination: Gen:      Critically ill-appearing HEENT:  EOMI, sclera anicteric Neck:     No masses; no thyromegaly, ET tube Lungs:    Clear to auscultation bilaterally; normal respiratory effort CV:         Regular rate and rhythm; no murmurs Abd:      + bowel sounds; soft, non-tender; no palpable masses, no distension Ext:    2+ edema; adequate peripheral perfusion Skin:      Warm and dry; no rash Neuro: Sedated, unresponsive  Lines: Right PA catheter, sided PICC line, Foley catheter, OG tube, endotracheal tube, left radial  arterial line, right subclavian HD catheter  Assessment & Plan:  78 year old gentleman with insulin-dependent diabetes, coronary artery disease which has not been intervened on, and CKD stage III who presents initially with central cord syndrome now found to have mixed shock and respiratory failure.  Possible mixed neurogenic cardiogenic shock with RV failure Possible PE, no  DVT on duplex A. fib with RVR Coronary artery disease Continue pressors, inhaled nitric oxide Continue amiodarone Anticoagulation on hold due to recent spine surgery.  AKI Continue CVVH  Respiratory failure Continue vent support for now. On empiric Zosyn Not a candidate for pressure support weans at current state He is not a good candidate for tracheostomy.  I am not sure he will survive this illness as he remains critically ill on pressors  Goals of care Ongoing discussion with primary team, cardiology and family. Cardiology, nephrology and critical care teams have discussed with son and cousin who is a hospitalist.  Made DNR with no escalation of care  Best practice:  Diet: Tube feeds Pain/Anxiety/Delirium protocol (if indicated): Fentanyl ordered VAP protocol (if indicated): VAP is ordered DVT prophylaxis: SCDs GI prophylaxis: PPI Glucose control: SSI, Levemir Foley: yes Mobility: Bedrest Code Status: DNR Family Communication: Per primary team and cardiology Disposition: ICU  Labs/imaging personally reviewed  BUN/creatinine 16/2.09 WBC 30.3, hemoglobin 8.4, platelets 66  Chest x-ray 12/11-stable lines and tubes, bilateral effusions with lower lobe opacities.  Critical care time:    The patient is critically ill with multiple organ system failure and requires high complexity decision making for assessment and support, frequent evaluation and titration of therapies, advanced monitoring, review of radiographic studies and interpretation of complex data.   Critical Care Time devoted to patient care services, exclusive of separately billable procedures, described in this note is 35 minutes.   Marshell Garfinkel MD [facility] 07/23/2019, 6:37 AM

## 2019-07-23 NOTE — Progress Notes (Signed)
I have left message by text to his son this morning asking to change to comfort care.  Lucas Price

## 2019-07-23 NOTE — Plan of Care (Signed)
Overnight Progress Note: Family made pt DNR today with plan to maintain current plan without escalation at this time. Remains on Levo, Epi, Vaso, Amio, and Fent gtt's. I've been able to pull 1L off overnight via CRRT. Pt continues to be extremely fluid overloaded and edematous with multiple serous blisters and weeping.  Pt is more responsive than last night, able to nod/shake to questions appropriately and move BUE (R > L) purposefully (able to lift arms and stick out tongue on command).   Son Deno Etienne was visiting last night and went home to rest. I told him we would call if there were any major changes. He is grateful for our care.   Problem: Nutrition: Goal: Adequate nutrition will be maintained Outcome: Progressing   Problem: Elimination: Goal: Will not experience complications related to urinary retention Outcome: Progressing   Problem: Pain Managment: Goal: General experience of comfort will improve Outcome: Progressing   Problem: Bowel/Gastric: Goal: Gastrointestinal status for postoperative course will improve Outcome: Progressing   Problem: Pain Management: Goal: Pain level will decrease Outcome: Progressing

## 2019-07-23 NOTE — Progress Notes (Signed)
Assisted tele visit to patient with family member.  Rodman Recupero M, RN  

## 2019-07-23 NOTE — Progress Notes (Signed)
Assisted tele visit to patient with family member.  Lucas Price M, RN  

## 2019-07-23 NOTE — Progress Notes (Signed)
Patient ID: Lucas Price, male   DOB: May 28, 1941, 78 y.o.   MRN: [id number] [facility] Progress Note   Assessment/ Plan:   1. Acute kidney Injury on chronic kidney disease stage III: Likely ATN from cardiogenic shock, started on CRRT yesterday for efforts at regulating multiple metabolic abnormalities/azotemia and effort at ultrafiltration.  Remains significantly volume overloaded.  Mental status improving with clearance of azotemia on CRRT; will continue efforts at ultrafiltration in the setting of pressor dependency. 2.  Acute decompensated right heart failure: On CRRT with significant volume excess, efforts underway to volume unloading-this has been difficult with hypotension/pressors. 3.  Central cord syndrome: Status post decompression and repair of Chance fracture.  Remains in cervical collar. 4.  Ventilator dependent respiratory failure: Currently unable to wean, significant encephalopathy.  Subjective:   Increased pressor requirements noted overnight-now DNR.   Objective:   BP (!) 113/55   Pulse 86   Temp (!) 97.5 F (36.4 C)   Resp (!) 26   Ht '5\' 3"'$  (1.6 m)   Wt 98.2 kg   SpO2 100%   BMI 38.35 kg/m   Intake/Output Summary (Last 24 hours) at 07/23/2019 0825 Last data filed at 07/23/2019 0800 Gross per 24 hour  Intake 5301.82 ml  Output 7902 ml  Net -2600.18 ml   Weight change: -1.3 kg  Physical Exam: Gen: Intubated, appears to respond to questions and comments CVS: Pulse regular rhythm, normal rate, S1 and S2 normal Resp: Anteriorly clear to auscultation, no rales/rhonchi-on ventilator Abd: Soft, obese, nontender.  3+ abdominal wall edema Ext: 4+ anasarca, SCDs in situ.  Imaging: DG Chest Port 1 View  Result Date: 07/22/2019 CLINICAL DATA:  78 year old male respiratory failure. Shock. Neurosurgery earlier this month for cervical spine, cervical spinal cord, and thoracolumbar junction spine injury after fall. EXAM: PORTABLE CHEST 1 VIEW COMPARISON:   Portable chest 07/21/2019 and earlier. FINDINGS: Portable AP semi upright view at 0544 hours. Stable endotracheal tube tip in good position between the level the clavicles and carina. Enteric tube courses to the abdomen, tip not included. Stable right IJ Swan-Ganz catheter. Stable right PICC line. Stable right subclavian approach dual lumen catheter. Cervical ACDF and lower thoracic spine hardware. Stable lung volumes and mediastinal contours. Combined airspace and veiling opacity in the lower lungs. Pulmonary vascularity in the upper lungs appears stable without overt edema. No pneumothorax. IMPRESSION: 1.  Stable lines and tubes. 2. Stable ventilation with bilateral pleural effusions with superimposed lower lung collapse or consolidation. Electronically Signed   By: Genevie Ann M.D.   On: 07/22/2019 08:24   DG CHEST PORT 1 VIEW  Result Date: 07/21/2019 CLINICAL DATA:  Status post placement of a dialysis catheter. EXAM: PORTABLE CHEST 1 VIEW COMPARISON:  July 19, 2019 FINDINGS: There has been interval placement of a right subclavian dialysis catheter. The tip projects over the right brachiocephalic vein. There is a right-sided PICC line in place with tip likely terminating near the mid to distal SVC. There is a Swan-Ganz catheter in place with tip projecting over the main right pulmonary artery. There is no pneumothorax. The heart size is unchanged. Bilateral pleural effusions and bilateral airspace opacities are again noted with no significant interval change. The endotracheal tube terminates approximately 6.8 cm above the carina. The enteric tube extends below the left hemidiaphragm. IMPRESSION: 1. Lines and tubes as above.  No pneumothorax. 2. Otherwise, no significant interval change. There are persistent bilateral pleural effusions and bibasilar airspace opacities similar to prior study. Electronically  Signed   By: Constance Holster M.D.   On: 07/21/2019 19:28    Labs: BMET Recent Labs  Lab  07/17/19 1337 07/17/19 1713 07/18/19 0456 07/18/19 0456 07/18/19 1602 07/19/19 0420 07/19/19 1639 07/20/19 7989 07/21/19 0432 07/22/19 0324 07/22/19 0540 07/22/19 0556 07/22/19 1529 07/23/19 0404  NA  --   --  148*   < >  --  148* 148* 148* 146* 144 141 141 141 140  K  --   --  4.6   < >  --  4.6 4.1 4.2 4.5 4.3 4.3 4.3 4.5 4.4  CL  --   --  119*  --   --  115* 118* 113* 114* 111  --   --  109 106  CO2  --   --  19*  --   --  20* 19* 19* 17* 20*  --   --  19* 21*  GLUCOSE  --   --  358*  --   --  318* 215* 214* 177* 183*  --   --  85 184*  BUN  --   --  81*  --   --  102* 116* 118* 133* 117*  --   --  85* 67*  CREATININE  --   --  2.72*  --   --  3.33* 3.59* 3.72* 4.01* 3.48*  --   --  2.76* 2.09*  CALCIUM  --   --  7.6*  --   --  7.6* 7.7* 7.7* 7.6* 7.7*  --   --  7.4* 7.3*  PHOS 4.2 4.4 4.1  --  4.2  --   --   --   --  5.1*  --   --  4.2 3.6   < > = values in this interval not displayed.   CBC Recent Labs  Lab 07/18/19 0456 07/19/19 0420 07/21/19 0432 07/22/19 0324 07/22/19 0540 07/22/19 0556  WBC 29.0* 24.3* 28.1* 30.3*  --   --   HGB 10.3* 9.4* 8.8* 8.4* 8.5* 8.8*  HCT 33.3* 31.6* 28.5* 27.9* 25.0* 26.0*  MCV 103.4* 105.3* 102.5* 102.2*  --   --   PLT 121* 88* 47* 66*  --   --     Medications:    . sodium chloride   Intravenous Once  . arformoterol  15 mcg Nebulization BID  . atorvastatin  10 mg Per Tube Daily  . budesonide (PULMICORT) nebulizer solution  0.25 mg Nebulization BID  . calcium carbonate  1 tablet Per Tube Daily  . chlorhexidine gluconate (MEDLINE KIT)  15 mL Mouth Rinse BID  . Chlorhexidine Gluconate Cloth  6 each Topical Daily  . cholecalciferol  5,000 Units Per Tube Daily  . clonazepam  0.25 mg Per Tube BID  . docusate  100 mg Per Tube BID  . dorzolamide  1 drop Both Eyes BH-q7a  . feeding supplement (PRO-STAT SUGAR FREE 64)  30 mL Per Tube BID  . free water  100 mL Per Tube Q8H  . insulin aspart  0-20 Units Subcutaneous Q4H  . insulin  aspart  5 Units Subcutaneous Q4H  . insulin detemir  20 Units Subcutaneous BID  . ipratropium  0.5 mg Nebulization Q6H  . ketorolac  1 drop Both Eyes TID  . latanoprost  1 drop Both Eyes QHS  . mouth rinse  15 mL Mouth Rinse 10 times per day  . midazolam  1 mg Intravenous Once  . pantoprazole sodium  40 mg Per Tube Daily  .  polyethylene glycol  17 g Per Tube Daily  . polyvinyl alcohol  1 drop Both Eyes Q4H  . sodium chloride flush  10-40 mL Intracatheter Q12H   Elmarie Shiley, MD 07/23/2019, 8:25 AM

## 2019-07-24 ENCOUNTER — Inpatient Hospital Stay (HOSPITAL_COMMUNITY): Payer: Medicare Other

## 2019-07-24 LAB — GLUCOSE, CAPILLARY
Glucose-Capillary: 109 mg/dL — ABNORMAL HIGH (ref 70–99)
Glucose-Capillary: 114 mg/dL — ABNORMAL HIGH (ref 70–99)
Glucose-Capillary: 153 mg/dL — ABNORMAL HIGH (ref 70–99)
Glucose-Capillary: 163 mg/dL — ABNORMAL HIGH (ref 70–99)
Glucose-Capillary: 170 mg/dL — ABNORMAL HIGH (ref 70–99)
Glucose-Capillary: 184 mg/dL — ABNORMAL HIGH (ref 70–99)
Glucose-Capillary: 195 mg/dL — ABNORMAL HIGH (ref 70–99)
Glucose-Capillary: 76 mg/dL (ref 70–99)

## 2019-07-24 LAB — RENAL FUNCTION PANEL
Albumin: 1.6 g/dL — ABNORMAL LOW (ref 3.5–5.0)
Albumin: 1.4 g/dL — ABNORMAL LOW (ref 3.5–5.0)
Anion gap: 13 (ref 5–15)
Anion gap: 13 (ref 5–15)
BUN: 46 mg/dL — ABNORMAL HIGH (ref 8–23)
BUN: 42 mg/dL — ABNORMAL HIGH (ref 8–23)
CO2: 22 mmol/L (ref 22–32)
CO2: 20 mmol/L — ABNORMAL LOW (ref 22–32)
Calcium: 7.2 mg/dL — ABNORMAL LOW (ref 8.9–10.3)
Calcium: 7.3 mg/dL — ABNORMAL LOW (ref 8.9–10.3)
Chloride: 104 mmol/L (ref 98–111)
Chloride: 105 mmol/L (ref 98–111)
Creatinine, Ser: 1.54 mg/dL — ABNORMAL HIGH (ref 0.61–1.24)
Creatinine, Ser: 1.52 mg/dL — ABNORMAL HIGH (ref 0.61–1.24)
GFR calc Af Amer: 50 mL/min — ABNORMAL LOW (ref >60)
GFR calc Af Amer: 51 mL/min — ABNORMAL LOW (ref >60)
GFR calc non Af Amer: 43 mL/min — ABNORMAL LOW (ref >60)
GFR calc non Af Amer: 44 mL/min — ABNORMAL LOW (ref >60)
Glucose, Bld: 191 mg/dL — ABNORMAL HIGH (ref 70–99)
Glucose, Bld: 166 mg/dL — ABNORMAL HIGH (ref 70–99)
Phosphorus: 3.8 mg/dL (ref 2.5–4.6)
Phosphorus: 4.1 mg/dL (ref 2.5–4.6)
Potassium: 4.7 mmol/L (ref 3.5–5.1)
Potassium: 4.8 mmol/L (ref 3.5–5.1)
Sodium: 139 mmol/L (ref 135–145)
Sodium: 138 mmol/L (ref 135–145)

## 2019-07-24 LAB — CBC
HCT: 23.1 % — ABNORMAL LOW (ref 39.0–52.0)
Hemoglobin: 7.2 g/dL — ABNORMAL LOW (ref 13.0–17.0)
MCH: 32.4 pg (ref 26.0–34.0)
MCHC: 31.2 g/dL (ref 30.0–36.0)
MCV: 104.1 fL — ABNORMAL HIGH (ref 80.0–100.0)
Platelets: 34 10*3/uL — ABNORMAL LOW (ref 150–400)
RBC: 2.22 MIL/uL — ABNORMAL LOW (ref 4.22–5.81)
RDW: 19.9 % — ABNORMAL HIGH (ref 11.5–15.5)
WBC: 38.2 10*3/uL — ABNORMAL HIGH (ref 4.0–10.5)
nRBC: 58.8 % — ABNORMAL HIGH (ref 0.0–0.2)

## 2019-07-24 LAB — LACTIC ACID, PLASMA
Lactic Acid, Venous: 3.6 mmol/L (ref 0.5–1.9)
Lactic Acid, Venous: 4.5 mmol/L (ref 0.5–1.9)

## 2019-07-24 LAB — MAGNESIUM: Magnesium: 2.5 mg/dL — ABNORMAL HIGH (ref 1.7–2.4)

## 2019-07-24 MED ORDER — BISACODYL 10 MG RE SUPP
10.0000 mg | Freq: Every day | RECTAL | Status: DC
  Administered 2019-07-24 – 2019-07-25 (×2): 10 mg via RECTAL
  Filled 2019-07-24 (×3): qty 1

## 2019-07-24 NOTE — Progress Notes (Signed)
RT called by RN around 0200 to assess Arterial line. Upon assessment, it was noted that line dressing needed to be reinforced, and line was not picking up on monitor. Aline repositioned and re-dressed with waveform noted on monitor after dressing. RT came back later for vent check and scheduled ABG, but was unable to pull blood from arterial line. No waveform noted on monitor, so arterial line removed by RT. Pt very swollen, and right side restricted so RT unable to obtain ABG at this time. RN notified.

## 2019-07-24 NOTE — Progress Notes (Signed)
Pharmacy Antibiotic Note  Lucas Price is a 78 y.o. male admitted on 07/15/2019 s/p fall.  Pharmacy had been consulted for Zosyn for neurogenic/septic shock. Patient now with WBC up to 38.2 (steroids stopped on 12/10), hypothermic, and high pressor requirements on CRRT. Cultures negative thus far.   Plan: Continue Zosyn at 3.375gm IV q6h - CRRT dosing  F/u LOT, GOC  Height: 5\' 3"  (160 cm) Weight: 214 lb 4.6 oz (97.2 kg) IBW/kg (Calculated) : 56.9  Temp (24hrs), Avg:98.3 F (36.8 C), Min:97.5 F (36.4 C), Max:99.1 F (37.3 C)  Recent Labs  Lab 07/17/19 2010 07/17/19 2309 07/18/19 0456 07/19/19 0420 07/20/19 0508 07/21/19 0432 07/22/19 0324 07/22/19 1529 07/23/19 0404 07/23/19 1610 07/24/19 0453 07/24/19 0454 07/24/19 0458 07/24/19 0936  WBC  --   --  29.0* 24.3*  --  28.1* 30.3*  --   --   --   --  38.2*  --   --   CREATININE  --   --  2.72* 3.33* 3.72* 4.01* 3.48* 2.76* 2.09* 1.73* 1.54*  --   --   --   LATICACIDVEN 1.7 1.9  --   --   --   --   --   --   --   --   --   --  3.1* 3.6*  VANCORANDOM  --   --   --   --  11  --   --   --   --   --   --   --   --   --     Estimated Creatinine Clearance: 42.1 mL/min (A) (by C-G formula based on SCr of 1.54 mg/dL (H)).    No Known Allergies  Antimicrobials this admission: CTX 12/2 >>12/7 Azith 12/3 >> 12/7 Vanc 12/6 >>12/10 Zosyn 12/8>   Microbiology results: 12/1 covid - negative 12/2 MRSA PCR - negative 12/4 surgical screen - negative 12/7 Bcx: ngF 12/8 TA: normal flora  Vertis Kelch, PharmD, [facility] PGY2 Cardiology Pharmacy Resident Phone [phone] 07/24/2019       10:22 AM  Please check [email or web address] for unit-specific pharmacist phone numbers

## 2019-07-24 NOTE — Progress Notes (Signed)
I have reached out to his son and spoke to him about poor prognostic indicators, continued hypotension and multiorgan failure.  Futility of care was discussed in detail.  After long discussion, it appears that the family is prepared however his cousin Jeanie Cooks appears to push the envelope.  I explained to him regarding multiple physicians for multiple specialty on feeling that the care is futile.  I also started that he should be made comfort care, in the event if he did not even survive, his mobility and meaningful life return would be extremely grim.  I also advised his son Deno Etienne that I am only willing to speak to him or to his mother who is patient's wife regarding future goals of care but not to [facility] who appears to be very unreasonable.   JG

## 2019-07-24 NOTE — Progress Notes (Signed)
RN weLink Physician-Brief Progress Note Patient Name: Lucas Price DOB: 1941/08/05 MRN: [id number]   Date of Service  07/24/2019  HPI/Events of Note  RN wishes to hold tube feeds due to abdominal distension and no bowel movement for several days.  eICU Interventions  OK to hold tube feeds.  Changed bisacodyl suppository to daily for now (was PRN previously and not being given). Also has miralax PO.     Intervention Category Intermediate Interventions: Other:  Charlott Rakes 07/24/2019, 5:54 AM

## 2019-07-24 NOTE — Progress Notes (Signed)
Assisted tele visit to patient with family member.  Lynnae Ludemann M, RN  

## 2019-07-24 NOTE — Progress Notes (Addendum)
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  12/3 Hypotensive, PCCM consult.  12/4 more hypoxic. Requiring HFO2 at 15 liters.  went back to OR  for Lumbar 3 fusion of unstable lumbar fracture. Returned to ICU on vent  12/5 weaning but lower Vts, upper extremity strength seems improved.  12/6: Extubated. Was initially awake. Alert. Following commands and in no distress. Then became suddenly unresponsive, acutely hypotensive and began to brady down. Was emergently re-intubated. Working dx at this point raising concern for sig cardiac component  12/7 PA catheter placement showed significant pulmonary hypertension 12/10 Started hemodialysis 12/11 Worsening shock, Made DNR  Consults:  Cardiology, heart failure Neurosurgery, nephrology  Procedures:  PA catheter placed 12/7: On NE 50  CVP 14 PA 61/30 (44) PCWP 12 SVR 877 PVR 8.0 WU Thermo CO/CI = 4.0/2.1  Significant Diagnostic Tests:  Echocardiogram on 12/7 demonstrates elevated right atrial pressures, estimated RVSP is 102 mmHg, the RV apex is akinetic and the left ventricle systolic ejection fraction is 60 to 65%.  Lower extremity duplex 12/8-no DVT  Micro Data:  Urinalysis on 12 7 with pyuria, bacteria present. Q aspirate 12/8 consistent with normal respiratory flora MRSA PCR on 12/4 is  negative. Blood cultures 12/7 negative COVID-19 negative on 12/1  Antimicrobials:  Azithromycin 12/2-12/7 Ceftriaxone 12/2-12/7 Cefepime 12/7  Vancomycin 12/6 >> 12/10 Piperacillin tazobactam 12/7 >>  Interim history/subjective:  Continues on CVVH.   Remains massively overloaded with inability to remove much fluid. A line was not functional and was removed  Objective   Blood pressure (!) 131/50, pulse 93, temperature 98.1 F (36.7 C), resp. rate (!) 27, height 5\' 3"  (1.6 m), weight 97.2 kg, SpO2 96 %. PAP: (71-79)/(35-44) 73/35 CVP:  [9 mmHg-24 mmHg] 9 mmHg CO:  [5.2 L/min-5.5 L/min] 5.5 L/min CI:  [2.7 L/min/m2-2.9 L/min/m2] 2.9 L/min/m2  Vent Mode: PRVC FiO2 (%):  [40 %-50 %] 40 % Set Rate:  [26 bmp] 26 bmp Vt Set:  [450 mL] 450 mL PEEP:  [10 cmH20] 10 cmH20 Plateau Pressure:  [20 cmH20-27 cmH20] 24 cmH20   Intake/Output Summary (Last 24 hours) at 07/24/2019 0742 Last data filed at 07/24/2019 0700 Gross per 24 hour  Intake 4809.19 ml  Output 3038 ml  Net 1771.19 ml   Filed Weights   07/22/19 0500 07/23/19 0500 07/24/19 0600  Weight: 99.5 kg 98.2 kg 97.2 kg    Examination: Gen:      Critically ill-appearing HEENT:  EOMI, sclera anicteric Neck:     No masses; no thyromegaly Lungs:    Clear to auscultation bilaterally; normal respiratory effort CV:         Regular rate and rhythm; no murmurs Abd:      + bowel sounds; soft, non-tender; no palpable masses, no distension Ext:    Anasarca; adequate peripheral perfusion Skin:      Warm and dry; no rash Neuro: Sedated, opens eyes to stimuli  Lines: Right PA catheter, sided PICC line, Foley catheter, OG tube, endotracheal tube, right subclavian HD catheter  Assessment & Plan:  78 year old gentleman with insulin-dependent diabetes, coronary artery disease which has not been intervened on, and CKD stage III who presents initially with central cord syndrome now found to have mixed shock and respiratory  failure.  Possible mixed neurogenic cardiogenic shock with RV failure A. fib with RVR Coronary artery disease Continue pressors, inhaled nitric oxide Continue amiodarone Anticoagulation on hold due to recent spine surgery. Family is requesting CT head but patient is too unstable to travel at this point.  AKI Continue CVVH. Keep even for now  Respiratory failure Continue vent support for now. No PSV weans On empiric Zosyn He is not a good candidate for tracheostomy.  I am not sure he will survive this illness as he remains critically ill on pressors  Goals of care Ongoing discussion with primary team, cardiology and family. Cardiology, nephrology and critical care teams have discussed with son and cousin who is a hospitalist.  Made DNR with no escalation of care  Best practice:  Diet: Tube feeds Pain/Anxiety/Delirium protocol (if indicated): Fentanyl gtt VAP protocol (if indicated): VAP is ordered DVT prophylaxis: SCDs GI prophylaxis: PPI Glucose control: SSI, Levemir Foley: yes Mobility: Bedrest Code Status: DNR Family Communication: Per primary team and cardiology Disposition: ICU  Labs/imaging personally reviewed  BUN/creatinine 46/1.54 lactic acid 3.1 WBC 13.3, hemoglobin 8.4, platelets 66  Chest x-ray 12/13- Line and tubes in stable portion, bilateral pleural effusion with basilar atelectasis Abdominal x-ray 12/13-nonspecific bowel gas pattern.  Critical care time:    The patient is critically ill with multiple organ system failure and requires high complexity decision making for assessment and support, frequent evaluation and titration of therapies, advanced monitoring, review of radiographic studies and interpretation of complex data.   Critical Care Time devoted to patient care services, exclusive of separately billable procedures, described in this note is 35 minutes.   Marshell Garfinkel MD [facility] 07/24/2019, 7:42 AM

## 2019-07-24 NOTE — Progress Notes (Signed)
Patient ID: Lucas Price, male   DOB: [birth year], 78 y.o.   MRN: [id number] [facility] Progress Note   Assessment/ Plan:   1. Acute kidney Injury on chronic kidney disease stage III: Likely ATN from cardiogenic shock, started on CRRT 3 days ago with significant improvement of azotemia/some improvement of encephalopathy however, unsuccessful volume unloading.  With significant volume overload and inability to tolerate ultrafiltration overnight resulting in net positive fluid balance.  Overall prognosis remains poor. 2.  Acute decompensated right heart failure: On CRRT with significant volume excess, remains on high-dose pressors with inability to tolerate ultrafiltration overnight.  Diminishing returns with CRRT rather than improvement of azotemia. 3.  Central cord syndrome: Status post decompression and repair of Chance fracture.  Remains in cervical collar. 4.  Ventilator dependent respiratory failure: Currently unable to wean secondary to volume status, ventilator management per CCM.  Subjective:   Lost arterial line overnight, unsuccessful UF with CRRT and overnight was net +1.7 L.   Objective:   BP (!) 131/50   Pulse 93   Temp 98.1 F (36.7 C)   Resp (!) 27   Ht '5\' 3"'$  (1.6 m)   Wt 97.2 kg   SpO2 96%   BMI 37.96 kg/m   Intake/Output Summary (Last 24 hours) at 07/24/2019 0753 Last data filed at 07/24/2019 0700 Gross per 24 hour  Intake 4789.19 ml  Output 3038 ml  Net 1751.19 ml   Weight change: -1 kg  Physical Exam: Gen: Intubated, awake and appears to respond to questions/comments.  Corneal scars CVS: Pulse regular rhythm, normal rate, S1 and S2 normal Resp: Anteriorly clear to auscultation, no rales/rhonchi-on ventilator Abd: Soft, obese, nontender.  3+ abdominal wall edema Ext: 4+ anasarca, SCDs in situ.  Imaging: No results found.  Labs: BMET Recent Labs  Lab 07/18/19 0456 07/18/19 1106 07/18/19 1602 07/20/19 0508 07/21/19 0432 07/22/19 0324  07/22/19 0540 07/22/19 0556 07/22/19 1529 07/23/19 0404 07/23/19 1610 07/24/19 0453  NA 148*  --   --  148* 146* 144 141 141 141 140 138 139  K 4.6  --   --  4.2 4.5 4.3 4.3 4.3 4.5 4.4 4.3 4.7  CL 119*   < >  --  113* 114* 111  --   --  109 106 108 104  CO2 19*   < >  --  19* 17* 20*  --   --  19* 21* 20* 22  GLUCOSE 358*   < >  --  214* 177* 183*  --   --  85 184* 158* 191*  BUN 81*   < >  --  118* 133* 117*  --   --  85* 67* 55* 46*  CREATININE 2.72*   < >  --  3.72* 4.01* 3.48*  --   --  2.76* 2.09* 1.73* 1.54*  CALCIUM 7.6*   < >  --  7.7* 7.6* 7.7*  --   --  7.4* 7.3* 7.1* 7.2*  PHOS 4.1  --  4.2  --   --  5.1*  --   --  4.2 3.6 3.2 3.8   < > = values in this interval not displayed.   CBC Recent Labs  Lab 07/18/19 0456 07/19/19 0420 07/21/19 0432 07/22/19 0324 07/22/19 0540 07/22/19 0556  WBC 29.0* 24.3* 28.1* 30.3*  --   --   HGB 10.3* 9.4* 8.8* 8.4* 8.5* 8.8*  HCT 33.3* 31.6* 28.5* 27.9* 25.0* 26.0*  MCV 103.4* 105.3* 102.5* 102.2*  --   --  PLT 121* 88* 47* 66*  --   --     Medications:    . sodium chloride   Intravenous Once  . arformoterol  15 mcg Nebulization BID  . atorvastatin  10 mg Per Tube Daily  . bisacodyl  10 mg Rectal Daily  . budesonide (PULMICORT) nebulizer solution  0.25 mg Nebulization BID  . calcium carbonate  1 tablet Per Tube Daily  . chlorhexidine gluconate (MEDLINE KIT)  15 mL Mouth Rinse BID  . Chlorhexidine Gluconate Cloth  6 each Topical Daily  . cholecalciferol  5,000 Units Per Tube Daily  . clonazepam  0.25 mg Per Tube BID  . docusate  100 mg Per Tube BID  . dorzolamide  1 drop Both Eyes BH-q7a  . feeding supplement (PRO-STAT SUGAR FREE 64)  30 mL Per Tube BID  . free water  100 mL Per Tube Q8H  . insulin aspart  0-20 Units Subcutaneous Q4H  . insulin aspart  5 Units Subcutaneous Q4H  . insulin detemir  20 Units Subcutaneous BID  . ipratropium  0.5 mg Nebulization Q6H  . ketorolac  1 drop Both Eyes TID  . latanoprost  1 drop  Both Eyes QHS  . mouth rinse  15 mL Mouth Rinse 10 times per day  . midazolam  1 mg Intravenous Once  . pantoprazole sodium  40 mg Per Tube Daily  . polyethylene glycol  17 g Per Tube Daily  . polyvinyl alcohol  1 drop Both Eyes Q4H  . sodium chloride flush  10-40 mL Intracatheter Q12H   Elmarie Shiley, MD 07/24/2019, 7:53 AM

## 2019-07-24 NOTE — Progress Notes (Signed)
Patient's  arterial line blood pressure had a map of 55-60  upon which vasopressors titration and crrt  filtration has been based for days. Patient is on max dose of levophed,vasopressin and epinephrine. Patient is grossly edematous and weeping into the bed linen.   Arterial line became positional and was lost  Eventually. Serial cuff pressure was consistently between 130-150, which was about 60-90 points above what the aline systolic was. Discussed with CCmd and suggested collecting a lactic acid to check for perfusion. Cardiac output has been 4-5.

## 2019-07-25 ENCOUNTER — Encounter: Payer: PRIVATE HEALTH INSURANCE | Admitting: Family Medicine

## 2019-07-25 ENCOUNTER — Inpatient Hospital Stay (HOSPITAL_COMMUNITY): Payer: Medicare Other

## 2019-07-25 LAB — GLUCOSE, CAPILLARY
Glucose-Capillary: 129 mg/dL — ABNORMAL HIGH (ref 70–99)
Glucose-Capillary: 30 mg/dL — CL (ref 70–99)
Glucose-Capillary: 53 mg/dL — ABNORMAL LOW (ref 70–99)
Glucose-Capillary: 128 mg/dL — ABNORMAL HIGH (ref 70–99)
Glucose-Capillary: 106 mg/dL — ABNORMAL HIGH (ref 70–99)
Glucose-Capillary: 82 mg/dL (ref 70–99)
Glucose-Capillary: 41 mg/dL — CL (ref 70–99)

## 2019-07-25 LAB — RENAL FUNCTION PANEL
Albumin: 1.4 g/dL — ABNORMAL LOW (ref 3.5–5.0)
Albumin: 1.1 g/dL — ABNORMAL LOW (ref 3.5–5.0)
Anion gap: 17 — ABNORMAL HIGH (ref 5–15)
Anion gap: 15 (ref 5–15)
BUN: 35 mg/dL — ABNORMAL HIGH (ref 8–23)
BUN: 29 mg/dL — ABNORMAL HIGH (ref 8–23)
CO2: 17 mmol/L — ABNORMAL LOW (ref 22–32)
CO2: 16 mmol/L — ABNORMAL LOW (ref 22–32)
Calcium: 7.5 mg/dL — ABNORMAL LOW (ref 8.9–10.3)
Calcium: 7.1 mg/dL — ABNORMAL LOW (ref 8.9–10.3)
Chloride: 105 mmol/L (ref 98–111)
Chloride: 104 mmol/L (ref 98–111)
Creatinine, Ser: 1.39 mg/dL — ABNORMAL HIGH (ref 0.61–1.24)
Creatinine, Ser: 1.23 mg/dL (ref 0.61–1.24)
GFR calc Af Amer: 57 mL/min — ABNORMAL LOW (ref >60)
GFR calc Af Amer: >60 mL/min (ref >60)
GFR calc non Af Amer: 49 mL/min — ABNORMAL LOW (ref >60)
GFR calc non Af Amer: 57 mL/min — ABNORMAL LOW (ref >60)
Glucose, Bld: 36 mg/dL — CL (ref 70–99)
Glucose, Bld: 84 mg/dL (ref 70–99)
Phosphorus: 4.7 mg/dL — ABNORMAL HIGH (ref 2.5–4.6)
Phosphorus: 4.9 mg/dL — ABNORMAL HIGH (ref 2.5–4.6)
Potassium: 5.3 mmol/L — ABNORMAL HIGH (ref 3.5–5.1)
Potassium: 5.6 mmol/L — ABNORMAL HIGH (ref 3.5–5.1)
Sodium: 139 mmol/L (ref 135–145)
Sodium: 135 mmol/L (ref 135–145)

## 2019-07-25 LAB — CBC
HCT: 22.2 % — ABNORMAL LOW (ref 39.0–52.0)
Hemoglobin: 7 g/dL — ABNORMAL LOW (ref 13.0–17.0)
MCH: 34 pg (ref 26.0–34.0)
MCHC: 31.5 g/dL (ref 30.0–36.0)
MCV: 107.8 fL — ABNORMAL HIGH (ref 80.0–100.0)
Platelets: 29 10*3/uL — CL (ref 150–400)
RBC: 2.06 MIL/uL — ABNORMAL LOW (ref 4.22–5.81)
RDW: 22.2 % — ABNORMAL HIGH (ref 11.5–15.5)
WBC: 46 10*3/uL — ABNORMAL HIGH (ref 4.0–10.5)
nRBC: 78.6 % — ABNORMAL HIGH (ref 0.0–0.2)

## 2019-07-25 LAB — LACTIC ACID, PLASMA: Lactic Acid, Venous: 3.1 mmol/L (ref 0.5–1.9)

## 2019-07-25 LAB — MAGNESIUM: Magnesium: 2.4 mg/dL (ref 1.7–2.4)

## 2019-07-25 MED ORDER — PRISMASOL BGK 0/2.5 32-2.5 MEQ/L REPLACEMENT SOLN
Status: DC
  Administered 2019-07-25: 18:00:00 via INTRAVENOUS_CENTRAL
  Filled 2019-07-25 (×2): qty 5000

## 2019-07-25 MED ORDER — DEXTROSE 50 % IV SOLN: 25.0000 g | INTRAVENOUS | Status: AC

## 2019-07-25 MED ORDER — IPRATROPIUM BROMIDE 0.02 % IN SOLN: 0.5000 mg | Freq: Four times a day (QID) | RESPIRATORY_TRACT | Status: DC | PRN

## 2019-07-25 MED ORDER — DEXTROSE 50 % IV SOLN
25.0000 g | INTRAVENOUS | Status: AC
  Administered 2019-07-25: 25 g via INTRAVENOUS

## 2019-07-25 MED ORDER — DEXTROSE 50 % IV SOLN
INTRAVENOUS | Status: AC
  Filled 2019-07-25: qty 50

## 2019-07-25 MED ORDER — DEXTROSE 10 % IV SOLN
INTRAVENOUS | Status: DC
  Administered 2019-07-25 (×2): via INTRAVENOUS

## 2019-07-25 MED ORDER — DEXTROSE 50 % IV SOLN
INTRAVENOUS | Status: AC
  Administered 2019-07-25: 25 g via INTRAVENOUS
  Filled 2019-07-25: qty 50

## 2019-07-25 MED ORDER — DEXTROSE 50 % IV SOLN
50.0000 mL | Freq: Once | INTRAVENOUS | Status: AC
  Administered 2019-07-25: 50 mL via INTRAVENOUS
  Filled 2019-07-25: qty 50

## 2019-07-25 NOTE — Progress Notes (Signed)
Pts condition remains poor, with hypotension despite triple vasopressors. Remains fluid overloaded despite CVVHD, and remains severely encephalopathic. I spoke to the patient's son who appears to be aware that his father's condition is terminal. While he is not ready to proceed with one-way extubation we discussed capping treatment at current levels. There is obviously nothing further to add from a neurosurgical standpoint.   Consuella Lose, MD [facility]

## 2019-07-25 NOTE — Progress Notes (Signed)
Advanced Heart Failure Rounding Note  PCP-Cardiologist: No primary care provider on file.   Subjective:     Remains on triple pressors (VP 0.03, NE 70 and Epi 12)  + iNO at 15ppm.   Intubated. Unresponsive.   Developed sepsis over the weekend with progressive lactic acidosis. Unable to pull anymore fluid.   WBC 46k. Last lactate 4.5  Swan #s CVP 15 PA 67/40    Objective:   Weight Range: 97.2 kg Body mass index is 37.96 kg/m.   Vital Signs:   Temp:  [95.4 F (35.2 C)-97.3 F (36.3 C)] 96.4 F (35.8 C) (12/14 1000) Pulse Rate:  [80-93] 83 (12/14 1000) Resp:  [26-28] 28 (12/14 1000) BP: (88-139)/(18-123) 99/18 (12/14 1000) SpO2:  [88 %-100 %] 96 % (12/14 1000) FiO2 (%):  [40 %-80 %] 80 % (12/14 1000) Last BM Date: 07/11/19  Weight change: Filed Weights   07/22/19 0500 07/23/19 0500 07/24/19 0600  Weight: 99.5 kg 98.2 kg 97.2 kg    Intake/Output:   Intake/Output Summary (Last 24 hours) at 07/25/2019 1033 Last data filed at 07/25/2019 1000 Gross per 24 hour  Intake 3595.05 ml  Output 3351 ml  Net 244.05 ml      Physical Exam    General: terminally ill appearing  On vent  HEENT: + ETT Neck: c-collar Cor: Regular Lungs: coarse on vent Abdomen: ++distended.  Extremities: no cyanosis, clubbing, rash, 4+ edema with multiple areas of skin breakdown Neuro: unresponsive on vent  Telemetry   Sinus 80-90s Personally reviewed  EKG    N/a   Labs    CBC Recent Labs    07/24/19 0454 07/25/19 0456  WBC 38.2* 46.0*  HGB 7.2* 7.0*  HCT 23.1* 22.2*  MCV 104.1* 107.8*  PLT 34* 29*   Basic Metabolic Panel Recent Labs    07/24/19 0453 07/24/19 1611 07/25/19 0456  NA 139 138 139  K 4.7 4.8 5.3*  CL 104 105 105  CO2 22 20* 17*  GLUCOSE 191* 166* 36*  BUN 46* 42* 35*  CREATININE 1.54* 1.52* 1.39*  CALCIUM 7.2* 7.3* 7.5*  MG 2.5*  --  2.4  PHOS 3.8 4.1 4.7*   Liver Function Tests Recent Labs    07/24/19 1611 07/25/19 0456  ALBUMIN  1.4* 1.4*   No results for input(s): LIPASE, AMYLASE in the last 72 hours. Cardiac Enzymes No results for input(s): CKTOTAL, CKMB, CKMBINDEX, TROPONINI in the last 72 hours.  BNP: BNP (last 3 results) No results for input(s): BNP in the last 8760 hours.  ProBNP (last 3 results) No results for input(s): PROBNP in the last 8760 hours.   D-Dimer No results for input(s): DDIMER in the last 72 hours. Hemoglobin A1C No results for input(s): HGBA1C in the last 72 hours. Fasting Lipid Panel No results for input(s): CHOL, HDL, LDLCALC, TRIG, CHOLHDL, LDLDIRECT in the last 72 hours. Thyroid Function Tests No results for input(s): TSH, T4TOTAL, T3FREE, THYROIDAB in the last 72 hours.  Invalid input(s): FREET3  Other results:   Imaging    DG Chest Port 1 View  Result Date: 07/25/2019 CLINICAL DATA:  Endotracheal placement.  Respiratory failure. EXAM: PORTABLE CHEST 1 VIEW COMPARISON:  07/24/2019 FINDINGS: Endotracheal tube tip is 3 cm above the carina. Orogastric or nasogastric tube enters the abdomen. Swan-Ganz catheter tip in the right middle or lower lobe pulmonary artery. Right subclavian central line tip at the subclavian SVC junction. Persistent bilateral effusions and lower lobe atelectasis. Upper lobes are clear. IMPRESSION:  Lines and tubes well position. Persistent bilateral effusions and lower lobe atelectasis. Electronically Signed   By: Nelson Chimes M.D.   On: 07/25/2019 08:11     Medications:     Scheduled Medications: . sodium chloride   Intravenous Once  . arformoterol  15 mcg Nebulization BID  . atorvastatin  10 mg Per Tube Daily  . bisacodyl  10 mg Rectal Daily  . budesonide (PULMICORT) nebulizer solution  0.25 mg Nebulization BID  . calcium carbonate  1 tablet Per Tube Daily  . chlorhexidine gluconate (MEDLINE KIT)  15 mL Mouth Rinse BID  . Chlorhexidine Gluconate Cloth  6 each Topical Daily  . cholecalciferol  5,000 Units Per Tube Daily  . clonazepam  0.25 mg  Per Tube BID  . dextrose      . docusate  100 mg Per Tube BID  . dorzolamide  1 drop Both Eyes BH-q7a  . feeding supplement (PRO-STAT SUGAR FREE 64)  30 mL Per Tube BID  . insulin aspart  0-20 Units Subcutaneous Q4H  . insulin aspart  5 Units Subcutaneous Q4H  . insulin detemir  20 Units Subcutaneous BID  . ipratropium  0.5 mg Nebulization Q6H  . ketorolac  1 drop Both Eyes TID  . latanoprost  1 drop Both Eyes QHS  . mouth rinse  15 mL Mouth Rinse 10 times per day  . midazolam  1 mg Intravenous Once  . pantoprazole sodium  40 mg Per Tube Daily  . polyethylene glycol  17 g Per Tube Daily  . polyvinyl alcohol  1 drop Both Eyes Q4H  . sodium chloride flush  10-40 mL Intracatheter Q12H    Infusions: .  prismasol BGK 4/2.5 400 mL/hr at 07/25/19 0314  .  prismasol BGK 4/2.5 200 mL/hr at 07/24/19 0515  . sodium chloride Stopped (07/18/19 1257)  . amiodarone 30 mg/hr (07/25/19 1000)  . dextrose 50 mL/hr at 07/25/19 1000  . epinephrine 12 mcg/min (07/25/19 1000)  . fentaNYL infusion INTRAVENOUS 50 mcg/hr (07/25/19 1000)  . norepinephrine (LEVOPHED) Adult infusion 70 mcg/min (07/25/19 1000)  . piperacillin-tazobactam Stopped (07/25/19 0847)  . prismasol BGK 4/2.5 1,500 mL/hr at 07/25/19 0506  . sodium chloride    . vasopressin (PITRESSIN) infusion - *FOR SHOCK* 0.03 Units/min (07/25/19 1000)    PRN Medications: Place/Maintain arterial line **AND** sodium chloride, acetaminophen **OR** acetaminophen, fentaNYL, heparin, [DISCONTINUED] ondansetron **OR** ondansetron (ZOFRAN) IV, polyvinyl alcohol, sodium chloride, sodium chloride, sodium chloride flush    Assessment/Plan   1. 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 Shock  - Combined neurogenic & cardiogenic shock (cor pulmonale) - On high dose pressors and iNO  - Now with component of septic shock as well   3. Acute on chronic  hypoxic respiratory railure  - Extubated but required reintubation 07/17/19 - Remains on vet  4. PAH with A/C Cor pulmonale - on inhaled NO. Swan numbers as above  5. A Fib RVR - Placed on amiodarone drip with conversion to Sinus Tach  - Remains in NSR.   6. AKI on CKD Stage IIIb  - On CVVHD. Unable to pull with shock  7. DNR  Developed sepsis over the weekend with worsening lactic acidosis despite maximal support. Now actively dying. Family now accepting that he will not survive.. Want [place] to continue current therapies for now and not withdraw. Suspect he will pass today. If lingers longer would advocated  for withdrawal of pressors to avoid further suffering.    CRITICAL CARE Performed by: Glori Bickers  Total critical care time: 45 minutes  Critical care time was exclusive of separately billable procedures and treating other patients.  Critical care was necessary to treat or prevent imminent or life-threatening deterioration.  Critical care was time spent personally by me (independent of midlevel providers or residents) on the following activities: development of treatment plan with patient and/or surrogate as well as nursing, discussions with consultants, evaluation of patient's response to treatment, examination of patient, obtaining history from patient or surrogate, ordering and performing treatments and interventions, ordering and review of laboratory studies, ordering and review of radiographic studies, pulse oximetry and re-evaluation of patient's condition.    Length of Stay: [place], MD  07/25/2019, 10:33 AM  Advanced Heart Failure Team Pager [phone] (M-F; 7a - 4p)  Please contact [facility] for night-coverage after hours (4p -7a ) and weekends on [email or web address]

## 2019-07-25 NOTE — Progress Notes (Signed)
Hypoglycemic Event  CBG: 41  Treatment: D50 50 mL (25 gm)  Symptoms: unable to assess mental status due to medical condition  Follow-up CBG: Time: n/a CBG Result: per new note by Ganji no more insulin or blood sugar orders, see note for further details  Possible Reasons for Event: NPO/lantus coverage   Comments/MD notified: Ganji MD note to d/c all blood sugar checks along with others     Redgie Grayer

## 2019-07-25 NOTE — Progress Notes (Signed)
Son called to come to bedside because patient is significantly hypotensive

## 2019-07-25 NOTE — Progress Notes (Signed)
Hypoglycemic Event  CBG: 30  Treatment: 1amp d50  Symptoms: unable to assess  Follow-up CBG: [id number] CBG [id number]  Possible Reasons for Event: NPO/lantus  Comments/MD notified:protocol initiated    Amo Kuffour, Trilby Drummer

## 2019-07-25 NOTE — Plan of Care (Signed)
  Problem: Bladder/Genitourinary: Goal: Urinary functional status for postoperative course will improve Outcome: Not Progressing   Problem: Skin Integrity: Goal: Will show signs of wound healing Outcome: Not Progressing   Problem: Clinical Measurements: Goal: Ability to maintain clinical measurements within normal limits will improve Outcome: Not Progressing Goal: Postoperative complications will be avoided or minimized Outcome: Not Progressing Goal: Diagnostic test results will improve Outcome: Not Progressing   Problem: Clinical Measurements: Goal: Ability to maintain clinical measurements within normal limits will improve Outcome: Not Progressing Goal: Postoperative complications will be avoided or minimized Outcome: Not Progressing Goal: Diagnostic test results will improve Outcome: Not Progressing   Problem: Bowel/Gastric: Goal: Gastrointestinal status for postoperative course will improve Outcome: Not Progressing   Problem: Education: Goal: Knowledge of General Education information will improve Description: Including pain rating scale, medication(s)/side effects and non-pharmacologic comfort measures Outcome: Not Progressing   Problem: Health Behavior/Discharge Planning: Goal: Ability to manage health-related needs will improve Outcome: Not Progressing   Problem: Clinical Measurements: Goal: Ability to maintain clinical measurements within normal limits will improve Outcome: Not Progressing Goal: Diagnostic test results will improve Outcome: Not Progressing Goal: Respiratory complications will improve Outcome: Not Progressing Goal: Cardiovascular complication will be avoided Outcome: Not Progressing   Problem: Activity: Goal: Risk for activity intolerance will decrease Outcome: Not Progressing   Problem: Nutrition: Goal: Adequate nutrition will be maintained Outcome: Not Progressing   Problem: Elimination: Goal: Will not experience complications related  to bowel motility Outcome: Not Progressing Goal: Will not experience complications related to urinary retention Outcome: Not Progressing   Problem: Pain Managment: Goal: General experience of comfort will improve Outcome: Progressing   Problem: Skin Integrity: Goal: Risk for impaired skin integrity will decrease Outcome: Not Progressing   Problem: Education: Goal: Ability to verbalize activity precautions or restrictions will improve Outcome: Not Progressing Goal: Knowledge of the prescribed therapeutic regimen will improve Outcome: Not Progressing Goal: Understanding of discharge needs will improve Outcome: Not Progressing   Problem: Bowel/Gastric: Goal: Gastrointestinal status for postoperative course will improve Outcome: Not Progressing

## 2019-07-25 NOTE — Progress Notes (Signed)
Assisted tele visit to patient with family member.  Sharonda Llamas M Markus Casten, RN  

## 2019-07-25 NOTE — Progress Notes (Signed)
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  12/3 Hypotensive, PCCM consult.  12/4 more hypoxic. Requiring HFO2 at 15 liters.  went back to OR  for Lumbar 3 fusion of unstable lumbar fracture. Returned to ICU on vent  12/5 weaning but lower Vts, upper extremity strength seems improved.  12/6: Extubated. Was initially awake. Alert. Following commands and in no distress. Then became suddenly unresponsive, acutely hypotensive and began to brady down. Was emergently re-intubated. Working dx at this point raising concern for sig cardiac component  12/7 PA catheter placement showed significant pulmonary hypertension 12/10 Started hemodialysis 12/11 Worsening shock, Made DNR  Consults:  Cardiology, heart failure Neurosurgery, nephrology  Procedures:  PA catheter placed 12/7: On NE 50  CVP 14 PA 61/30 (44) PCWP 12 SVR 877 PVR 8.0 WU Thermo CO/CI = 4.0/2.1  Significant Diagnostic Tests:  Echocardiogram on 12/7 demonstrates elevated right atrial pressures, estimated RVSP is 102 mmHg, the RV apex is akinetic and the left ventricle systolic ejection fraction is 60 to 65%.  Lower extremity duplex 12/8-no DVT  Micro Data:  Urinalysis on 12 7 with pyuria, bacteria present. Q aspirate 12/8 consistent with normal respiratory flora MRSA PCR on 12/4 is  negative. Blood cultures 12/7 negative COVID-19 negative on 12/1  Antimicrobials:  Azithromycin 12/2-12/7 Ceftriaxone 12/2-12/7 Cefepime 12/7  Vancomycin 12/6 >> 12/10 Piperacillin tazobactam 12/7 >>  Interim history/subjective:  Actively dying despite maximal support.  Son at bedside.  Objective   Blood pressure (!) 104/33, pulse 85, temperature (!) 95.5 F (35.3 C), resp. rate (!) 26, height 5\' 3"  (1.6 m), weight 97.2 kg, SpO2 99 %. PAP: (55-75)/(26-35) 55/26 CVP:  [6 mmHg-14 mmHg] 6 mmHg PCWP:  [10 mmHg] 10 mmHg CO:  [4.7 L/min] 4.7 L/min CI:  [2.5 L/min/m2] 2.5 L/min/m2  Vent Mode: PRVC FiO2 (%):  [40 %] 40 % Set Rate:  [26 bmp] 26 bmp Vt Set:  [450 mL] 450 mL PEEP:  [5 cmH20-10 cmH20] 5 cmH20 Plateau Pressure:  [24 cmH20-26 cmH20] 24 cmH20   Intake/Output Summary (Last 24 hours) at 07/25/2019 0740 Last data filed at 07/25/2019 0700 Gross per 24 hour  Intake 3493.67 ml  Output 3971 ml  Net -477.33 ml   Filed Weights   07/22/19 0500 07/23/19 0500 07/24/19 0600  Weight: 99.5 kg 98.2 kg 97.2 kg    Examination: GEN: ill appearing man on vent HEENT: c collar in place, ETT with minimal secretions CV: ext lukewarm, heart sounds regular PULM: diminished at bases, no accessory muscle use GI: Soft, hypoactive BS EXT: mottled NEURO: Eyes open but not responding to stimuli PSYCH: cannot assess SKIN: mottled   Assessment & Plan:  78 year old gentleman with insulin-dependent diabetes, coronary artery disease which has not been intervened on, and CKD stage III who presents initially with central cord syndrome now found to have mixed shock and respiratory failure.  He has developed multiorgan failure and a non-survivable  clinical state. Son informed he will probably pass this morning. Continue all support for now per discussion with him. No chest compressions or shocks. Fentanyl PRN dyspnea or signs of discomfort.  32 minutes critical care time Erskine Emery MD PCCM

## 2019-07-25 NOTE — Progress Notes (Signed)
Spoke to his sone. No further active therapy. They have accepted that he will not survive and wish to continue present therapy without active therapy until his last breath.  No further titration of medications and no further lab draw.  Adrian Prows, MD, [facility] 07/25/2019, 9:38 AM [facility] [place] Pager: [phone] Office: [phone]

## 2019-07-25 NOTE — Progress Notes (Signed)
Assisted tele visit to patient with family members.  Leovanni Bjorkman M Latresha Yahr, RN   

## 2019-07-25 NOTE — Progress Notes (Signed)
I will discontinue non essential life sustaining medication  Like statins, etc.  Stop insulin supplementation and ancillary support  Adrian Prows, MD, [facility] 07/25/2019, 8:39 PM [facility] [place] Pager: [phone] Office: [phone]

## 2019-07-25 NOTE — Progress Notes (Addendum)
[facility] NEPHROLOGY PROGRESS NOTE  Assessment/ Plan: Pt is a 78 y.o. yo male CHF, central cord syndrome respiratory failure and AKI on CKD.  #AKI on CKD stage III likely ATN from cardiogenic shock.  Patient has no clinical improvement and remains encephalopathic.  Unable to UF during CRRT because of hypotension.  K5.3 noted, follow-up afternoon lab.  Overall prognosis remains poor.  Discussed with the patient's son who wants to continue current care including dialysis.  Informed him that if patient does not improve by tomorrow then we will stop CRRT.  #Acute decompensated right heart failure: Unable to maintain UF even with CRRT.  Cardiology following.  #Central cord syndrome status post decompression and repair.  Per neurosurgery.  #VDRF: Unable to wean.  Remains on ventilator.  PCCM following.  Addendum 5:14 PM; evening K 5.6. Change pre- and post filter fluid to 0 k bath. Prognosis grim.   Subjective: Seen and examined.  Remains encephalopathic, intubated and soft blood pressure.  Unable to UF.  Patient son at bedside. Objective Vital signs in last 24 hours: Vitals:   07/25/19 0801 07/25/19 0805 07/25/19 0815 07/25/19 0830  BP:    99/63  Pulse:   80 84  Resp:      Temp:   (!) 95.7 F (35.4 C) (!) 95.9 F (35.5 C)  TempSrc:      SpO2: (!) 88% 93% 95% 95%  Weight:      Height:       Weight change:   Intake/Output Summary (Last 24 hours) at 07/25/2019 0839 Last data filed at 07/25/2019 0800 Gross per 24 hour  Intake 3503.1 ml  Output 3764 ml  Net -260.9 ml       Labs: Basic Metabolic Panel: Recent Labs  Lab 07/24/19 0453 07/24/19 1611 07/25/19 0456  NA 139 138 139  K 4.7 4.8 5.3*  CL 104 105 105  CO2 22 20* 17*  GLUCOSE 191* 166* 36*  BUN 46* 42* 35*  CREATININE 1.54* 1.52* 1.39*  CALCIUM 7.2* 7.3* 7.5*  PHOS 3.8 4.1 4.7*   Liver Function Tests: Recent Labs  Lab 07/19/19 0420 07/24/19 0453 07/24/19 1611 07/25/19 0456  AST 135*  --    --   --   ALT 218*  --   --   --   ALKPHOS 85  --   --   --   BILITOT 1.1  --   --   --   PROT 5.0*  --   --   --   ALBUMIN 1.9* 1.6* 1.4* 1.4*   No results for input(s): LIPASE, AMYLASE in the last 168 hours. No results for input(s): AMMONIA in the last 168 hours. CBC: Recent Labs  Lab 07/19/19 0420 07/21/19 0432 07/22/19 0324 07/22/19 0556 07/24/19 0454 07/25/19 0456  WBC 24.3* 28.1* 30.3*  --  38.2* 46.0*  HGB 9.4* 8.8* 8.4* 8.8* 7.2* 7.0*  HCT 31.6* 28.5* 27.9* 26.0* 23.1* 22.2*  MCV 105.3* 102.5* 102.2*  --  104.1* 107.8*  PLT 88* 47* 66*  --  34* 29*   Cardiac Enzymes: No results for input(s): CKTOTAL, CKMB, CKMBINDEX, TROPONINI in the last 168 hours. CBG: Recent Labs  Lab 07/24/19 1956 07/24/19 2347 07/25/19 0459 07/25/19 0529 07/25/19 0759  GLUCAP 114* 76 30* 129* 53*    Iron Studies: No results for input(s): IRON, TIBC, TRANSFERRIN, FERRITIN in the last 72 hours. Studies/Results: DG Chest Port 1 View  Result Date: 07/25/2019 CLINICAL DATA:  Endotracheal placement.  Respiratory failure. EXAM:  PORTABLE CHEST 1 VIEW COMPARISON:  07/24/2019 FINDINGS: Endotracheal tube tip is 3 cm above the carina. Orogastric or nasogastric tube enters the abdomen. Swan-Ganz catheter tip in the right middle or lower lobe pulmonary artery. Right subclavian central line tip at the subclavian SVC junction. Persistent bilateral effusions and lower lobe atelectasis. Upper lobes are clear. IMPRESSION: Lines and tubes well position. Persistent bilateral effusions and lower lobe atelectasis. Electronically Signed   By: Nelson Chimes M.D.   On: 07/25/2019 08:11   DG Chest Port 1 View  Result Date: 07/24/2019 CLINICAL DATA:  78 year old male with a history of a fall, lumbar fracture, with neurologic sequela, respiratory problems. EXAM: PORTABLE CHEST 1 VIEW COMPARISON:  Multiple prior, most recent 07/22/2019 FINDINGS: Cardiomediastinal silhouette unchanged in size and contour. Opacities at  the bilateral lung bases persist with obscuration of the heart borders. Hemidiaphragm on the left and the right not visualized. Unchanged right IJ sheath transmits Swan-Ganz catheter. Swan-Ganz terminates overlying the right hilum. Unchanged right subclavian central line/temp HD catheter. Unchanged gastric tube. Surgical changes of the lower thoracic spine and the cervical spine, incompletely imaged. No pneumothorax. IMPRESSION: Unchanged endotracheal tube terminating suitably above the carina. Unchanged right IJ sheath transmits Swan-Ganz catheter with the Swan-Ganz terminating overlying the right hilum. Unchanged gastric tube and right subclavian central venous catheter/HD catheter. Similar appearance of bibasilar opacities, likely a combination of atelectasis/consolidation, pleural effusion, and/or mild edema. Surgical changes of the spine incompletely imaged. Electronically Signed   By: Corrie Mckusick D.O.   On: 07/24/2019 11:11   DG Abd Portable 1V  Result Date: 07/24/2019 CLINICAL DATA:  78 year old male with respiratory failure and shock. Neuro surgery earlier this month for cervical spine, spinal cord and thoracolumbar junction spinal injuries after a fall. Abdominal distension. EXAM: PORTABLE ABDOMEN - 1 VIEW COMPARISON:  Abdominal radiographs 07/17/2019 and earlier. FINDINGS: AP portable supine views of the abdomen and pelvis at 0703 hours. Thoracolumbar posterior spinal hardware in place. Midline skin staples. An enteric tube is stable with tip at the level of the distal stomach and side hole at the level of the gastric body. Decreased abdominal bowel gas since 07/17/2019. No dilated loops are evident. Similar appearance of proximal right colon gas Lung bases appear not significantly changed with evidence of bilateral pleural effusions and airspace opacity. Stable visualized osseous structures. IMPRESSION: 1. Stable enteric tube. 2. Decreased abdominal bowel gas since 07/17/19 with no dilated loops  evident. If there is clinical evidence of a bowel obstruction then CT Abdomen and Pelvis would be complementary to exclude the presence of fluid-filled dilated loops. 3. Bilateral pleural effusions and airspace disease. Electronically Signed   By: Genevie Ann M.D.   On: 07/24/2019 11:42    Medications: Infusions: .  prismasol BGK 4/2.5 400 mL/hr at 07/25/19 0314  .  prismasol BGK 4/2.5 200 mL/hr at 07/24/19 0515  . sodium chloride Stopped (07/18/19 1257)  . amiodarone 30 mg/hr (07/25/19 0800)  . dextrose 50 mL/hr at 07/25/19 0833  . epinephrine 12 mcg/min (07/25/19 0800)  . fentaNYL infusion INTRAVENOUS 50 mcg/hr (07/25/19 0800)  . norepinephrine (LEVOPHED) Adult infusion 70 mcg/min (07/25/19 0800)  . piperacillin-tazobactam 3.375 g (07/25/19 0817)  . prismasol BGK 4/2.5 1,500 mL/hr at 07/25/19 0506  . sodium chloride    . vasopressin (PITRESSIN) infusion - *FOR SHOCK* 0.03 Units/min (07/25/19 0800)    Scheduled Medications: . sodium chloride   Intravenous Once  . arformoterol  15 mcg Nebulization BID  . atorvastatin  10 mg Per  Tube Daily  . bisacodyl  10 mg Rectal Daily  . budesonide (PULMICORT) nebulizer solution  0.25 mg Nebulization BID  . calcium carbonate  1 tablet Per Tube Daily  . chlorhexidine gluconate (MEDLINE KIT)  15 mL Mouth Rinse BID  . Chlorhexidine Gluconate Cloth  6 each Topical Daily  . cholecalciferol  5,000 Units Per Tube Daily  . clonazepam  0.25 mg Per Tube BID  . dextrose      . docusate  100 mg Per Tube BID  . dorzolamide  1 drop Both Eyes [id number]  . feeding supplement (PRO-STAT SUGAR FREE 64)  30 mL Per Tube BID  . insulin aspart  0-20 Units Subcutaneous Q4H  . insulin aspart  5 Units Subcutaneous Q4H  . insulin detemir  20 Units Subcutaneous BID  . ipratropium  0.5 mg Nebulization Q6H  . ketorolac  1 drop Both Eyes TID  . latanoprost  1 drop Both Eyes QHS  . mouth rinse  15 mL Mouth Rinse 10 times per day  . midazolam  1 mg Intravenous Once  .  pantoprazole sodium  40 mg Per Tube Daily  . polyethylene glycol  17 g Per Tube Daily  . polyvinyl alcohol  1 drop Both Eyes Q4H  . sodium chloride flush  10-40 mL Intracatheter Q12H    have reviewed scheduled and prn medications.  Physical Exam: General: Critically ill male, sedated and intubated Heart:RRR, s1s2 nl Lungs: Coarse breath sound bilateral Abdomen:soft, Non-tender, distended Extremities: Has anasarca Dialysis Access: Right subclavian temporary catheter placed on 12/10.  Dron Prasad Bhandari 07/25/2019,8:39 AM  LOS: 12 days  Pager: [phone]

## 2019-07-25 NOTE — Progress Notes (Signed)
RT unable to obtain ABG due to swelling in the arms and restricted arm.

## 2019-07-25 NOTE — Progress Notes (Signed)
Inpatient Diabetes Program Recommendations  AACE/ADA: New Consensus Statement on Inpatient Glycemic Control (2015)  Target Ranges:  Prepandial:   less than 140 mg/dL      Peak postprandial:   less than 180 mg/dL (1-2 hours)      Critically ill patients:  140 - 180 mg/dL   Lab Results  Component Value Date   GLUCAP 106 (H) 07/25/2019   HGBA1C 5.1 05/30/2019    Review of Glycemic Control Results for Lucas Price, Lucas Price (MRN [id number]) as of 07/25/2019 12:19  Ref. Range 07/25/2019 04:59 07/25/2019 05:29 07/25/2019 07:59 07/25/2019 09:12 07/25/2019 11:32  Glucose-Capillary Latest Ref Range: 70 - 99 mg/dL 30 (LL) 129 (H) 53 (L) 128 (H) 106 (H)   Inpatient Diabetes Program Recommendations:  CBG 30 @ 04:59 today. -Discontinue basal insulin @ this time.  Thank you, Nani Gasser. Alisha Bacus, RN, MSN, CDE  Diabetes Coordinator Inpatient Glycemic Control Team Team Pager [phone] (8am-5pm) 07/25/2019 12:19 PM

## 2019-08-12 NOTE — Significant Event (Signed)
Rapid Response Event Note  Overview:      Patient is a DNR and developed asystole on the monitor  Interventions: comfort care, no ACLS as patient is a DNR.    Event Summary:    Patient developed asystole on the monitor, no heart beat was able to be auscultated by two sperate RNs. Ventilator was turned off and no breath sounds were auscultated.  CRRT machine was turned off along with all mediation drips.  Family aware of the patient's passing.      Christen Butter MD made aware of situation and he agreed to sign death certificate.  Time of death was 45.  All belongings were taken by family.  Patient was not a medical examiner case and all lines and drains were taken out.  Body was then transported to the morgue after discussions with [place] donor services and patient placement for the hospital.    Redgie Grayer

## 2019-08-12 NOTE — Discharge Summary (Addendum)
Physician Discharge Summary  Patient ID: Lucas Price MRN: [id number] DOB/AGE: 09/02/1940 79 y.o.  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  Unfortunately post procedure was complicated by respiratory failure, collagen shock need for intubation and inotropic support.  Following which patient alert multiorgan failure including renal failure and need for CVVHD and multiple pressor support.  Patient did poorly after that and in spite of multiple discussions with the family, although patient's immediate family where fairly abnormal for suggestions, patient's nephew who is a hospitalist in [place] appear to have multiple and relatively unreasonable demands of need to discuss with multiple specialty specialists for every decision that was made.  One day prior to patient's death, the care was felt to be definitely futile by multiple physicians again and family decided to continue aggressive therapy without any escalation of treatments, patient passed away on Aug 16, 2019 at 01:38 hours.  Please see his chart for the complexity of care and coordination of care and decision making. I was involved at various stages trying to coordinate end-of-life issues with the family, spent several hours discussions over the phone and also on the floor.  Labs:   Lab Results  Component Value  Date   WBC 46.0 (H) 07/25/2019   HGB 7.0 (L) 07/25/2019   HCT 22.2 (L) 07/25/2019   MCV 107.8 (H) 07/25/2019   PLT 29 (LL) 07/25/2019    Recent Labs  Lab 07/25/19 1522  NA 135  K 5.6*  CL 104  CO2 16*  BUN 29*  CREATININE 1.23  CALCIUM 7.1*  GLUCOSE 84    Lipid Panel     Component Value Date/Time   CHOL 89 06/04/2018 1152   TRIG 96 07/17/2019 0511   HDL 35.60 (L) 06/04/2018 1152   CHOLHDL 3 06/04/2018 1152   VLDL 17.8 06/04/2018 1152   LDLCALC 36 06/04/2018 1152    BNP (last 3 results) No results for input(s): BNP in the last 8760 hours.  HEMOGLOBIN A1C Lab Results  Component Value Date   HGBA1C 5.1 05/30/2019    Cardiac Panel (last 3 results) No results for input(s): CKTOTAL, CKMB, TROPONINI, RELINDX in the last 8760 hours.  No results found for: CKTOTAL, CKMB, CKMBINDEX, TROPONINI   TSH Recent Labs    01/11/19 1527  TSH 0.73   Radiology: DG Cervical Spine 1 View  Result Date: 07/24/2019 CLINICAL DATA:  Follow-up ACDF EXAM: DG CERVICAL SPINE - 1 VIEW; DG C-ARM 1-60 MIN COMPARISON:  Earlier same day FINDINGS: Single lateral C-arm image shows ACDF from C5-T1. Interbody spacers well positioned. Anterior plate and screws. Sponges remain in the operative approach. IMPRESSION: ACDF C5 through T1. Electronically Signed   By: Nelson Chimes M.D.   On: 07/19/2019 17:24   DG Cervical Spine 1 View  Result Date: 08/07/2019 CLINICAL DATA:  Preop planning EXAM: DG CERVICAL SPINE - 1 VIEW COMPARISON:  MR cervical spine dated 07/23/2019. CT cervical spine dated 08/02/19. FINDINGS: Fracture involving the inferior endplate/anterior osteophyte at C5, better evaluated  on MR. Degenerative changes involving the mid/lower cervical spine. Moderate prevertebral soft tissue swelling. IMPRESSION: Fracture involving the inferior endplate/anterior osteophyte at C5, better evaluated on MR. Moderate prevertebral soft tissue swelling. Electronically Signed   By: Julian Hy M.D.    On: 07/16/2019 10:28   DG Lumbar Spine 2-3 Views  Result Date: 07/13/2019 CLINICAL DATA:  Posterior laminectomy and fusion with MA Z OR EXAM: LUMBAR SPINE - 2-3 VIEW COMPARISON:  CT evaluation of 07/14/2019 FINDINGS: Initial image shows a template in place in AP view. Lateral view of the spine shows placement of bilateral posterior spinal fusion extending from T10 through L3 spanning the widened intervertebral disc space at the T12-L1 level. No hardware complication is identified. Fluoroscopic guidance was provided for performance of this procedure. IMPRESSION: Intraoperative imaging during T10 through L3 posterior spinal fusion. No evidence of hardware complication or other acute abnormality. Electronically Signed   By: Zetta Bills M.D.   On: 07/19/2019 19:21   DG Abd 1 View  Result Date: 07/16/2019 CLINICAL DATA:  OG tube placed EXAM: ABDOMEN - 1 VIEW COMPARISON:  None. FINDINGS: The tip of the NG tube is seen projecting over the mid body of the stomach. Small bilateral pleural effusions are seen. There is patchy airspace opacity seen at both lung bases. Overlying spinal fixation hardware seen in the lumbar spine. IMPRESSION: Tip the NG tube seen projecting over the mid body of stomach. Electronically Signed   By: Prudencio Pair M.D.   On: 07/16/2019 21:58   CT Head Wo Contrast  Result Date: 07/17/2019 CLINICAL DATA:  79 year old male with ataxia status post fall backwards. EXAM: CT HEAD WITHOUT CONTRAST TECHNIQUE: Contiguous axial images were obtained from the base of the skull through the vertex without intravenous contrast. COMPARISON:  MiniCAT CT paranasal sinuses 01/12/2018. FINDINGS: Brain: There is disproportionate biparietal cerebral volume loss (series 6, image 37), but elsewhere cerebral volume appears normal for age. No cortical encephalomalacia identified. No midline shift, ventriculomegaly, mass effect, evidence of mass lesion, intracranial hemorrhage or evidence of cortically based acute  infarction. Gray-white matter differentiation is within normal limits throughout the brain. Vascular: Calcified atherosclerosis at the skull base. No suspicious intracranial vascular hyperdensity. Skull: No acute osseous abnormality identified. Incidental prominent occipital arachnoid granulations on series 4, image 17 (normal variant). Sinuses/Orbits: Visualized paranasal sinuses and mastoids are well pneumatized today. Other: Mild posterior vertex scalp hematoma suspected (series 6, image 29). Underlying calvarium intact. Otherwise negative orbit and scalp soft tissues. IMPRESSION: 1. Mild posterior vertex scalp soft tissue injury without underlying fracture. 2.  No acute traumatic injury to the brain identified. 3. Suggestion of disproportionate biparietal cerebral volume loss, non-specific but can be seen with dementia. Electronically Signed   By: Genevie Ann M.D.   On: 07/25/2019 22:40   CT CHEST WO CONTRAST  Result Date: 07/12/2019 CLINICAL DATA:  Shortness of breath EXAM: CT CHEST WITHOUT CONTRAST TECHNIQUE: Multidetector CT imaging of the chest was performed following the standard protocol without IV contrast. COMPARISON:  April 20, 2018 FINDINGS: Cardiovascular: Coronary artery calcifications are seen. Scattered aortic atherosclerosis. The heart size is normal. There is no pericardial thickening or effusion. Mediastinum/Nodes: Again noted are scattered mediastinal, pretracheal and prevascular lymph nodes seen. The largest within the anterior subcarinal region measuring 1.3 cm as on the prior exam. The thyroid gland, trachea and esophagus demonstrate no significant findings. Lungs/Pleura: There is new ground-glass patchy airspace opacities within the left upper lobe and throughout the right lung, predominantly within the periphery.  There is also new patchy areas of consolidation within the posterior bilateral lower lungs, right greater than left. There is unchanged nodular area of streaky opacity again  noted at the left lung base and along the right middle lobe. There is no pleural effusion. Upper abdomen: The visualized portion of the upper abdomen is unremarkable. Musculoskeletal/Chest wall: There is widening of the anterior inter disc space at T12-L1 which is partially visualized with linear calcifications in the inter disc space. This was not seen on the prior exam and could be due to fracture of the anterior flowing osteophyte. Again noted are anterior flowing osteophytes seen throughout the thoracic spine. IMPRESSION: 1. New ground-glass patchy airspace opacities seen within the left upper lobe and throughout the right lung predominantly within the periphery. There is also streaky areas of consolidation in the posterior lower lungs. This is nonspecific, however could be due to atypical viral pneumonia. 2. Unchanged patchy rounded airspace nodules within the left lower lung base and right middle lobe as on prior CT dating back to 2019. 3. Unchanged scattered mediastinal lymph nodes. 4. New age-indeterminate widening of the anterior interdisc space at T12-L1 which is partially visualized and concerning for fracture of the anterior flowing osteophyte. Would correlate with patient's site of pain and further evaluation is required, would recommend dedicated MRI Electronically Signed   By: Prudencio Pair M.D.   On: 07/18/2019 02:33   CT Cervical Spine Wo Contrast  Result Date: 08/08/2019 CLINICAL DATA:  79 year old male with ataxia status post fall backwards. EXAM: CT CERVICAL SPINE WITHOUT CONTRAST TECHNIQUE: Multidetector CT imaging of the cervical spine was performed without intravenous contrast. Multiplanar CT image reconstructions were also generated. COMPARISON:  Head CT today reported separately. FINDINGS: Alignment: Reversal of lower cervical lordosis. Cervicothoracic junction alignment is within normal limits. Bilateral posterior element alignment is within normal limits. Skull base and vertebrae:  Visualized skull base is intact. No atlanto-occipital dissociation. Incidental prominent occipital bone arachnoid granulations (normal variant). No acute osseous abnormality identified. Soft tissues and spinal canal: No visible canal hematoma. There is prevertebral fluid or edema at the C3 level demonstrated on series 9, image 118. No adjacent vertebral fracture identified. Otherwise negative visible noncontrast neck soft tissues. Disc levels: There appears to be significant degenerative cervical spinal stenosis at most levels, including at least moderate suspected stenosis at C2-C3 (series 9, image 100). Lower cervical spinal stenosis is related to bulky disc osteophyte complex. Upper chest: T2 through T4 upper thoracic ankylosis. Grossly intact visible upper thoracic levels. Osteopenia. There is patchy nonspecific ground-glass opacity in the visible right upper lobe (series 5, images 87 and 103). Negative visible left lung apex. Negative visible noncontrast superior mediastinum. IMPRESSION: 1. No cervical spine fracture is identified, but mild prevertebral fluid raises the possibility of anterior ligamentous injury at C3. A noncontrast Cervical Spine MRI could evaluate further. 2. 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 This could predispose to blunt trauma to the cervical spinal cord in the setting of fall. 3. Nonspecific pulmonary ground-glass opacity in the right upper lobe. Consider acute viral/atypical respiratory infection. 4. 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 Electronically Signed   By: Genevie Ann M.D.   On: 07/16/2019 22:49   CT THORACIC SPINE WO CONTRAST  Result Date: 07/20/2019 CLINICAL DATA:  Status post fusions at C5-T1 and T10-L3. New sensory deficits in the lower extremities. Recent C5-6 fractures and L1 fractures. EXAM: CT THORACIC AND LUMBAR SPINE WITHOUT CONTRAST TECHNIQUE: Multidetector CT imaging  of the thoracic and lumbar spine was  performed without contrast. Multiplanar CT image reconstructions were also generated. COMPARISON:  MRIs of the cervical and lumbar spine dated 07/22/2019 and CT scan of the lumbar spine dated 07/14/2019 FINDINGS: CT THORACIC SPINE FINDINGS Alignment: Normal. Vertebrae: The spine is fused from T2 through T5, likely congenital from T2-T4 and due to anterior osteophytes at T4-5. Osteophytes also fuse the vertebrae from T6 through T12. There is also posterior surgical fusion from T10 through T12. Paraspinal and other soft tissues: There are small bilateral pleural effusions, right greater than left with compressive atelectasis in the lower lobes. Aortic atherosclerosis. Disc levels: The discs from T1-2 through T11-12 demonstrate no extrusions or protrusions. Asymmetric endplate osteophytes at T9-10 extend to the right with slight compression of the right side of the thecal sac. Pedicle screws at T10-11 and T11-12 appear in good position with no evidence of loosening. 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 Alignment: Normal. Vertebrae: The patient has undergone posterior fusion from T10-L1. The hardware appears in excellent position. No other change since the prior study. Again noted is the chance fracture through the T12-L1 level. There is no visible protrusion of bone or disc material into the spinal canal at the level of the fracture. No visible epidural hematoma. Paraspinal and other soft tissues: No acute abnormalities of the paraspinal soft tissues in the abdomen or pelvis. Aortic atherosclerosis. Foley catheter in the bladder. Disc levels: T12-L1: The previously described fracture extends through the disc space and vertebral endplates and spinous process of L1. No paraspinal hematoma or appreciable disc protrusion. L1-2: Chronic disc space narrowing with small broad-based disc bulge with accompanying osteophytes. L2-3: Disc space narrowing. Small disc protrusion to the  right of midline, unchanged. L3-4: Disc space narrowing. Broad-based disc protrusion with accompanying osteophytes, unchanged. Moderate central canal stenosis. L4-5: Chronic disc space narrowing with a broad-based central disc protrusion with accompanying osteophytes with compression of thecal sac with hypertrophy of the ligamentum flavum and facet joints combining to create the spinal stenosis. L5-S1: Congenital fusion.  Vestigial disc. IMPRESSION: CT THORACIC SPINE IMPRESSION No acute abnormalities of the thoracic spine. Most of the thoracic spine is either congenitally or degenerative leaf used as described. CT LUMBAR SPINE IMPRESSION Interval posterior fusion from T10 to L3 with stabilization of the Chance fracture of T12-L1. No epidural hematoma. 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 Electronically Signed   By: Lorriane Shire M.D.   On: 07/20/2019 10:26   CT LUMBAR SPINE WO CONTRAST  Result Date: 07/20/2019 CLINICAL DATA:  Status post fusions at C5-T1 and T10-L3. New sensory deficits in the lower extremities. Recent C5-6 fractures and L1 fractures. EXAM: CT THORACIC AND LUMBAR SPINE WITHOUT CONTRAST TECHNIQUE: Multidetector CT imaging of the thoracic and lumbar spine was performed without contrast. Multiplanar CT image reconstructions were also generated. COMPARISON:  MRIs of the cervical and lumbar spine dated 08/10/2019 and CT scan of the lumbar spine dated 07/14/2019 FINDINGS: CT THORACIC SPINE FINDINGS Alignment: Normal. Vertebrae: The spine is fused from T2 through T5, likely congenital from T2-T4 and due to anterior osteophytes at T4-5. Osteophytes also fuse the vertebrae from T6 through T12. There is also posterior surgical fusion from T10 through T12. Paraspinal and other soft tissues: There are small bilateral pleural effusions, right greater than left with compressive atelectasis in the  lower lobes. Aortic atherosclerosis. Disc levels: The  discs from T1-2 through T11-12 demonstrate no extrusions or protrusions. Asymmetric endplate osteophytes at T9-10 extend to the right with slight compression of the right side of the thecal sac. Pedicle screws at T10-11 and T11-12 appear in good position with no evidence of loosening. 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 Alignment: Normal. Vertebrae: The patient has undergone posterior fusion from T10-L1. The hardware appears in excellent position. No other change since the prior study. Again noted is the chance fracture through the T12-L1 level. There is no visible protrusion of bone or disc material into the spinal canal at the level of the fracture. No visible epidural hematoma. Paraspinal and other soft tissues: No acute abnormalities of the paraspinal soft tissues in the abdomen or pelvis. Aortic atherosclerosis. Foley catheter in the bladder. Disc levels: T12-L1: The previously described fracture extends through the disc space and vertebral endplates and spinous process of L1. No paraspinal hematoma or appreciable disc protrusion. L1-2: Chronic disc space narrowing with small broad-based disc bulge with accompanying osteophytes. L2-3: Disc space narrowing. Small disc protrusion to the right of midline, unchanged. L3-4: Disc space narrowing. Broad-based disc protrusion with accompanying osteophytes, unchanged. Moderate central canal stenosis. L4-5: Chronic disc space narrowing with a broad-based central disc protrusion with accompanying osteophytes with compression of thecal sac with hypertrophy of the ligamentum flavum and facet joints combining to create the spinal stenosis. L5-S1: Congenital fusion.  Vestigial disc. IMPRESSION: CT THORACIC SPINE IMPRESSION No acute abnormalities of the thoracic spine. Most of the thoracic spine is either congenitally or degenerative leaf used as described. CT LUMBAR SPINE  IMPRESSION Interval posterior fusion from T10 to L3 with stabilization of the Chance fracture of T12-L1. No epidural hematoma. 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 Electronically Signed   By: Lorriane Shire M.D.   On: 07/20/2019 10:26   CT LUMBAR SPINE WO CONTRAST  Result Date: 07/14/2019 CLINICAL DATA:  Lumbosacral fracture. Surgical planning. EXAM: CT LUMBAR SPINE WITHOUT CONTRAST TECHNIQUE: Multidetector CT imaging of the lumbar spine was performed without intravenous contrast administration. Multiplanar CT image reconstructions were also generated. COMPARISON:  MRI of the lumbar spine 08/10/2019 FINDINGS: Segmentation: Transitional L5 anatomy is again noted. Alignment: No significant listhesis is present. There is straightening of the normal lumbar lordosis. Vertebrae: Acute/subacute superior endplate fracture of L1 is again noted. 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 Nondisplaced fracture through the L1 spinous process is again noted. Pedicles are intact. No other posterior element fractures are present. Vertebral body heights are otherwise maintained. Chronic endplate degenerative changes are most evident on the right at L4-5. Fused left lateral syndesmophytes are present at L1-2 and L2-3. The fracture extends through the ossified left lateral syndesmophyte at L1. Paraspinal and other soft tissues: There is some stranding in the paravertebral space at the level of the fracture. No significant hemorrhage is present. Bilateral pleural effusions are present, right greater than left. Associated atelectasis is noted. The heart is enlarged. Coronary artery calcifications are present. Atherosclerotic changes are noted in the aorta and branch vessels. Foley catheter is in place. 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 T12-L1: Chronic leftward disc protrusion is present. Asymmetric endplate spurring and syndesmophytes contribute to mild left foraminal narrowing. L1-2: A leftward soft disc protrusion is present. Mild left subarticular  and moderate left foraminal stenosis is present. L2-3: A broad-based disc protrusion is asymmetric to the right. Moderate right subarticular narrowing is secondary to focal extrusion. Mild right foraminal narrowing is present as well. L3-4: A broad-based disc protrusion is present. Moderate facet hypertrophy is noted. Moderate central canal stenosis is present with subarticular narrowing bilaterally. Mild to moderate foraminal narrowing is stable. L4-5: A moderate central disc protrusion is present. Facet hypertrophy and ligamentum flavum thickening is noted. Moderate central canal stenosis with right greater than left subarticular narrowing is stable. Moderate osseous foraminal narrowing is present bilaterally. L5-S1: Facet hypertrophy contributes to mild subarticular narrowing bilaterally. IMPRESSION: 1. Acute/subacute superior endplate fracture of L1 with extension through the ossified left lateral syndesmophyte at L1. 2. Nondisplaced fracture of the L1 spinous process. 3. Multilevel spondylosis of the lumbar spine as described. 4. Bilateral pleural effusions, right greater than left. 5. Aortic Atherosclerosis (ICD10-I70.0) and coronary artery disease Electronically Signed   By: San Morelle M.D.   On: 07/14/2019 08:02   MR Cervical Spine Wo Contrast  Result Date: 07/27/2019 CLINICAL DATA:  Weakness, fall hit head EXAM: MRI CERVICAL SPINE WITHOUT CONTRAST TECHNIQUE: Multiplanar, multisequence MR imaging of the cervical spine was performed. No intravenous contrast was administered. COMPARISON:  None. FINDINGS: Alignment: There is reversal of the normal cervical lordosis. A grade 1 anterolisthesis of C4 on C5 is seen measuring 3 mm. Vertebrae: There is  a focal disruption of the anterior longitudinal ligament seen at C5-C6 with fluid between the cleft. There is also a probable acutely fractured anterior osteophyte at the anterior inferior endplate of C5. Increased marrow signal seen at the anterior vertebral bodies of C5, C6, C7. There is increased signal seen between the inter spinous ligament at C5-C6 with surrounding paraspinal soft tissue edema. Cord: There is increased cord signal seen at the C5 level with mild cord expansion, likely acute cord contusion. No definite blood products seen within the cord. There is increased cord signal seen from C6 through C7 which is likely due to myelomalacia. There is also minimally increased cord signal seen at C2-C3, likely due to early myelomalacia. Posterior Fossa, vertebral arteries, paraspinal tissues: Significant prevertebral fluid and soft tissue edema is noted from C2 through C6. There is also paraspinal soft tissue edema seen posteriorly. The visualized portion of the posterior fossa is unremarkable. Normal flow voids seen within the vertebral arteries. Disc levels: C1-C2: Atlanto-axial junction is normal, without canal narrowing C2-C3: There is a disc osteophyte complex and uncovertebral osteophytes which causes moderate bilateral neural foraminal narrowing. The central thecal sac measures 4 mm in AP diameter. C3-C4: There is a disc osteophyte complex and uncovertebral osteophytes which causes severe left and moderate right neural foraminal narrowing. The central thecal sac is effaced measuring 5 mm in AP diameter. C4-C5: There is a disc osteophyte complex and uncovertebral osteophytes which causes moderate bilateral neural foraminal narrowing. The central thecal sac is effaced measuring 6 mm in AP diameter. C5-C6: There is a disc osteophyte complex which is eccentric to the left with uncovertebral osteophytes. Severe bilateral neural foraminal narrowing is seen. There is effacement of the thecal sac which measures  3 mm in AP diameter. C6-C7: There is disc osteophyte complex and uncovertebral osteophytes which causes severe bilateral neural foraminal narrowing. There is effacement of the thecal sac which measures 4 mm in AP diameter. C7-T1: There is a disc osteophyte complex with a central disc protrusion and uncovertebral osteophytes which causes severe right and moderate to severe left neural  foraminal narrowing. Central thecal sac is effaced measuring 5 mm in AP diameter. IMPRESSION: 1. Acute disruption of the anterior longitudinal ligament at C5-C6 with a acute fracture of the anterior inferior endplate of the C5 osteophyte . Significant prevertebral soft tissue edema and fluid is seen extending from C2 through C6. 2. Marrow edema/osseous contusions involving the C5-C7 anterior vertebral bodies. 3. interspinous ligamentous injury at C5-C6. 4. Findings suggestive of acute cord contusion seen at C5 with cord edema. No definite epidural hematoma or hemorrhage. 5. 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 6. 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 These results were called by telephone at the time of interpretation on 08/05/2019 at 3:39am to provider [facility] , who verbally acknowledged these results. Electronically Signed   By: Prudencio Pair M.D.   On: 07/29/2019 03:39   MR LUMBAR SPINE WO CONTRAST  Addendum Date: 07/27/2019   ADDENDUM REPORT: 07/22/2019 03:46 ADDENDUM: Study discussed by telephone with Dr. Shela Leff on 08/10/2019 at 0340 hours. Electronically Signed   By: Genevie Ann M.D.   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 Suspected injury through the disc space at T11-T12 on chest CT today. EXAM: MRI LUMBAR SPINE WITHOUT CONTRAST TECHNIQUE: Multiplanar,  multisequence MR imaging of the lumbar spine was performed. No intravenous contrast was administered. COMPARISON:  Chest CT earlier today. FINDINGS: Segmentation: In conjunction with the chest CT today, the L5 level is sacralized with a vestigial L5-S1 disc space. Correlation with radiographs is recommended prior to any operative intervention. Alignment: Straightening of lumbar lordosis. Mild focal lordosis at T12-L1. Vertebrae: Widespread thoracic spine ankylosis which likely continued through the T12-L1 disc space. However, there is injury through that disc space and distraction anteriorly resulting in mild lordosis (series 6, image 9). Disrupted anterior longitudinal ligament. Posttraumatic signal changes in the disc. There is an associated nondisplaced fracture through the L1 spinous process (same image). And possibly a fracture also of the right T12 lamina/facet (series 7, image 7). In addition, there is mild compression of the T12 and L1 endplates with mild marrow edema, more pronounced along the right lateral L1 superior endplate. Normal background bone marrow signal. The L2 through L4 levels appear to remain intact. Sacralized L5. Intact visible sacrum and SI joints. Conus medullaris and cauda equina: Conus extends to the L1 level. No lower spinal cord or conus signal abnormality. Paraspinal and other soft tissues: There is edema tracking along the right chorea of the diaphragm and lateral to the right psoas muscle. Less pronounced left-side paraspinal soft tissue edema. There is mild posterior paraspinal muscle injury at the L1 level greater on the left. Additionally, there is probably a posttraumatic hematoma tracking in the lateral fibers of the right lumbar paraspinal muscles at L2 and L3 (series 8, image 18). This is not included on the sagittal images. Partially visible distended urinary bladder. Disc levels: Ordinary although advanced lumbar spine disc and endplate degeneration. Severe disc space loss  with bulky disc osteophyte complex throughout the lumbar spine. Subsequent -Severe right lateral recess stenosis at L2-L3 (series 8, image 18, descending right L3 nerve level). -moderate spinal and severe bilateral lateral recess stenosis at L3-L4 (descending L4 nerve levels). -severe right greater than left lateral recess stenosis at L4-L5 (series 8, image 27) with mild spinal stenosis (L5 nerve  levels). IMPRESSION: 1. Acute fracture through ankylosed spine at the T12-L1 disc space. Disrupted anterior longitudinal ligament, disc, and propagation of the fracture through the posterior elements including the L1 spinous process. This is most likely an unstable injury. Recommend Neurosurgery consultation. 2. Associated paraspinal muscle injury, including a small intramuscular hematoma suspected along the lateral right erector spinae fibers at L2-L3. 3. Transitional lumbosacral anatomy, with sacralized L5 level and vestigial L5-S1 disc space. 4. Superimposed lumbar spine degeneration: advanced lumbar disc and endplate disease with multilevel severe lateral recess stenosis, moderate L3-L4 spinal stenosis. 5. Distended urinary bladder. Electronically Signed: By: Genevie Ann M.D. On: 08/03/2019 03:23   DG Chest Port 1 View  Result Date: 07/25/2019 CLINICAL DATA:  Endotracheal placement.  Respiratory failure. EXAM: PORTABLE CHEST 1 VIEW COMPARISON:  07/24/2019 FINDINGS: Endotracheal tube tip is 3 cm above the carina. Orogastric or nasogastric tube enters the abdomen. Swan-Ganz catheter tip in the right middle or lower lobe pulmonary artery. Right subclavian central line tip at the subclavian SVC junction. Persistent bilateral effusions and lower lobe atelectasis. Upper lobes are clear. IMPRESSION: Lines and tubes well position. Persistent bilateral effusions and lower lobe atelectasis. Electronically Signed   By: Nelson Chimes M.D.   On: 07/25/2019 08:11   DG Chest Port 1 View  Result Date: 07/24/2019 CLINICAL DATA:   79 year old male with a history of a fall, lumbar fracture, with neurologic sequela, respiratory problems. EXAM: PORTABLE CHEST 1 VIEW COMPARISON:  Multiple prior, most recent 07/22/2019 FINDINGS: Cardiomediastinal silhouette unchanged in size and contour. Opacities at the bilateral lung bases persist with obscuration of the heart borders. Hemidiaphragm on the left and the right not visualized. Unchanged right IJ sheath transmits Swan-Ganz catheter. Swan-Ganz terminates overlying the right hilum. Unchanged right subclavian central line/temp HD catheter. Unchanged gastric tube. Surgical changes of the lower thoracic spine and the cervical spine, incompletely imaged. No pneumothorax. IMPRESSION: Unchanged endotracheal tube terminating suitably above the carina. Unchanged right IJ sheath transmits Swan-Ganz catheter with the Swan-Ganz terminating overlying the right hilum. Unchanged gastric tube and right subclavian central venous catheter/HD catheter. Similar appearance of bibasilar opacities, likely a combination of atelectasis/consolidation, pleural effusion, and/or mild edema. Surgical changes of the spine incompletely imaged. Electronically Signed   By: Corrie Mckusick D.O.   On: 07/24/2019 11:11   DG Chest Port 1 View  Result Date: 07/22/2019 CLINICAL DATA:  79 year old male respiratory failure. Shock. Neurosurgery earlier this month for cervical spine, cervical spinal cord, and thoracolumbar junction spine injury after fall. EXAM: PORTABLE CHEST 1 VIEW COMPARISON:  Portable chest 07/21/2019 and earlier. FINDINGS: Portable AP semi upright view at 0544 hours. Stable endotracheal tube tip in good position between the level the clavicles and carina. Enteric tube courses to the abdomen, tip not included. Stable right IJ Swan-Ganz catheter. Stable right PICC line. Stable right subclavian approach dual lumen catheter. Cervical ACDF and lower thoracic spine hardware. Stable lung volumes and mediastinal contours.  Combined airspace and veiling opacity in the lower lungs. Pulmonary vascularity in the upper lungs appears stable without overt edema. No pneumothorax. IMPRESSION: 1.  Stable lines and tubes. 2. Stable ventilation with bilateral pleural effusions with superimposed lower lung collapse or consolidation. Electronically Signed   By: Genevie Ann M.D.   On: 07/22/2019 08:24   DG CHEST PORT 1 VIEW  Result Date: 07/21/2019 CLINICAL DATA:  Status post placement of a dialysis catheter. EXAM: PORTABLE CHEST 1 VIEW COMPARISON:  July 19, 2019 FINDINGS: There has been interval placement of a  right subclavian dialysis catheter. The tip projects over the right brachiocephalic vein. There is a right-sided PICC line in place with tip likely terminating near the mid to distal SVC. There is a Swan-Ganz catheter in place with tip projecting over the main right pulmonary artery. There is no pneumothorax. The heart size is unchanged. Bilateral pleural effusions and bilateral airspace opacities are again noted with no significant interval change. The endotracheal tube terminates approximately 6.8 cm above the carina. The enteric tube extends below the left hemidiaphragm. IMPRESSION: 1. Lines and tubes as above.  No pneumothorax. 2. Otherwise, no significant interval change. There are persistent bilateral pleural effusions and bibasilar airspace opacities similar to prior study. Electronically Signed   By: Constance Holster M.D.   On: 07/21/2019 19:28   DG CHEST PORT 1 VIEW  Result Date: 07/19/2019 CLINICAL DATA:  Evaluate ETT. EXAM: PORTABLE CHEST 1 VIEW COMPARISON:  July 18, 2019 FINDINGS: The ETT, right PICC line, and PA catheter stone or an NG tube terminates below today's film. No pneumothorax. Bilateral pleural effusions with underlying opacities, similar in the interval. Stable cardiomediastinal silhouette. No other interval changes. IMPRESSION: 1. Stable support apparatus. 2. Bilateral pleural effusions with underlying  opacities are stable. No other interval change. 3. No pneumothorax. Electronically Signed   By: Dorise Bullion III M.D   On: 07/19/2019 08:43   DG CHEST PORT 1 VIEW  Result Date: 07/18/2019 CLINICAL DATA:  Central line placement EXAM: PORTABLE CHEST 1 VIEW COMPARISON:  07/18/2019 FINDINGS: The endotracheal tube terminates approximately 5.7 cm above the carina. The Swan-Ganz catheter tip projects over the right lower lobe pulmonary artery. The right-sided PICC line appears to terminate near the cavoatrial junction. The heart size is unchanged from prior study. There is no pneumothorax. Small bilateral pleural effusions are again noted. Bibasilar airspace disease is noted favored to represent atelectasis with consolidation not excluded. IMPRESSION: 1. Lines and tubes as above.  No pneumothorax. 2. Persistent bilateral pleural effusions. 3. Bibasilar airspace disease favored to represent atelectasis with an infiltrate not excluded. Electronically Signed   By: Constance Holster M.D.   On: 07/18/2019 19:57   DG Chest Port 1 View  Result Date: 07/18/2019 CLINICAL DATA:  Atelectasis. EXAM: PORTABLE CHEST 1 VIEW COMPARISON:  Radiograph yesterday. FINDINGS: Endotracheal tube tip 5.7 cm in the carina. Enteric tube in place tip below the diaphragm in the left upper quadrant. Right upper extremity PICC tip in the SVC. Unchanged bilateral pleural effusions and bibasilar opacities/atelectasis. Unchanged heart size and mediastinal contours. Slight increase patchy airspace disease in the mid lower lung zones, right greater than left. No pneumothorax. Midline skin staples. IMPRESSION: 1. Endotracheal tube tip 5.7 cm in the carina. 2. Unchanged bilateral pleural effusions and bibasilar opacities/atelectasis. 3. Slight increase in patchy airspace disease in the mid lower lung zones, right greater than left, may be pulmonary edema or infection. Electronically Signed   By: Keith Rake M.D.   On: 07/18/2019 04:17   DG  CHEST PORT 1 VIEW  Result Date: 07/17/2019 CLINICAL DATA:  ETT placement EXAM: PORTABLE CHEST - 1 VIEW COMPARISON:  07/16/2019 FINDINGS: Endotracheal tube tip 5.9 cm above carina. Gastric tube extends at least as far as the stomach, tip not seen. Right arm PICC to the distal SVC. Persistent small bilateral pleural effusions. Atelectasis/consolidation in the lung bases. Heart size upper limits normal for technique. Aortic Atherosclerosis (ICD10-170.0). No pneumothorax. Thoracolumbar fixation hardware, incompletely visualized distally. IMPRESSION: 1. Endotracheal tube tip 5.9 cm above the carina. 2.  Persistent small bilateral pleural effusions with bibasilar atelectasis/consolidation. 3. No pneumothorax or pneumomediastinum. Electronically Signed   By: Lucrezia Europe M.D.   On: 07/17/2019 10:46   DG CHEST PORT 1 VIEW  Result Date: 07/16/2019 CLINICAL DATA:  Postop atelectasis EXAM: PORTABLE CHEST 1 VIEW COMPARISON:  07/14/2019 FINDINGS: Endotracheal tube 6.5 cm from carina. Lungs are hyperinflated. Bilateral small pleural effusions. No pneumothorax. No focal consolidation. There are bilateral small pulmonary nodules primarily in the RIGHT lung which correspond to nodularity of comparison CT. IMPRESSION: 1. Endotracheal tube in good position. 2. Bilateral small pleural effusions. 3. Pulmonary nodules unchanged from comparison CT. Electronically Signed   By: Suzy Bouchard M.D.   On: 07/16/2019 11:34   DG CHEST PORT 1 VIEW  Result Date: 07/14/2019 CLINICAL DATA:  Respiratory distress. EXAM: PORTABLE CHEST 1 VIEW COMPARISON:  CT 08/10/2019. FINDINGS: Cardiomegaly. Bilateral pulmonary infiltrates/edema with bilateral pleural effusions. Findings most consistent CHF. Dense bibasilar atelectasis. No pneumothorax. Prior cervicothoracic spine fusion. IMPRESSION: 1. Cardiomegaly. Diffuse bilateral pulmonary infiltrates/edema with bilateral pleural effusions. Findings most consistent with CHF. 2.  Dense bibasilar  atelectasis. Electronically Signed   By: Marcello Moores  Register   On: 07/14/2019 09:34   DG Abd Portable 1V  Result Date: 07/24/2019 CLINICAL DATA:  79 year old male with respiratory failure and shock. Neuro surgery earlier this month for cervical spine, spinal cord and thoracolumbar junction spinal injuries after a fall. Abdominal distension. EXAM: PORTABLE ABDOMEN - 1 VIEW COMPARISON:  Abdominal radiographs 07/17/2019 and earlier. FINDINGS: AP portable supine views of the abdomen and pelvis at 0703 hours. Thoracolumbar posterior spinal hardware in place. Midline skin staples. An enteric tube is stable with tip at the level of the distal stomach and side hole at the level of the gastric body. Decreased abdominal bowel gas since 07/17/2019. No dilated loops are evident. Similar appearance of proximal right colon gas Lung bases appear not significantly changed with evidence of bilateral pleural effusions and airspace opacity. Stable visualized osseous structures. IMPRESSION: 1. Stable enteric tube. 2. Decreased abdominal bowel gas since 07/17/19 with no dilated loops evident. If there is clinical evidence of a bowel obstruction then CT Abdomen and Pelvis would be complementary to exclude the presence of fluid-filled dilated loops. 3. Bilateral pleural effusions and airspace disease. Electronically Signed   By: Genevie Ann M.D.   On: 07/24/2019 11:42   DG Abd Portable 1V  Result Date: 07/17/2019 CLINICAL DATA:  Orogastric tube placement EXAM: PORTABLE ABDOMEN - 1 VIEW COMPARISON:  the previous day's study FINDINGS: Gastric tube has been advanced, tip near the pylorus. Gas-filled nondilated small bowel and colon are noted in the upper abdomen. The lower abdomen is excluded. Thoracolumbar posterior fixation hardware is noted. Midline skin staples. Bilateral pleural effusions. IMPRESSION: 1. Gastric tube has been advanced, tip near the pylorus. 2. Gas-filled nondilated small bowel and colon in the upper abdomen. 3.  Bilateral pleural effusions. Electronically Signed   By: Lucrezia Europe M.D.   On: 07/17/2019 12:36   DG C-Arm 1-60 Min  Result Date: 07/12/2019 CLINICAL DATA:  T10-L3 fusion EXAM: DG C-ARM 1-60 MIN FLUOROSCOPY TIME:  Fluoroscopy Time:  1 minutes 1 second Number of Acquired Spot Images: 3 COMPARISON:  None. FINDINGS: Pedicle rods and screws been placed at 6 levels. The levels involved cannot be determined on provided imaging. However, history states the surgery is from T10 through L3. Hardware appears to be in good position. A vertically oriented linear density projects over the fourth vertebral body to the left of  midline on the frontal view only of uncertain etiology or significance. IMPRESSION: 1. Pedicle rods and screws placed over 6 levels as above. Hardware is in good position. 2. There is a vertically oriented density projected over the fourth involve vertebral body to the left of midline of uncertain etiology or significance. Recommend clinical correlation. Electronically Signed   By: Dorise Bullion III M.D   On: 08/07/2019 19:18   DG C-Arm 1-60 Min  Result Date: 07/28/2019 CLINICAL DATA:  Follow-up ACDF EXAM: DG CERVICAL SPINE - 1 VIEW; DG C-ARM 1-60 MIN COMPARISON:  Earlier same day FINDINGS: Single lateral C-arm image shows ACDF from C5-T1. Interbody spacers well positioned. Anterior plate and screws. Sponges remain in the operative approach. IMPRESSION: ACDF C5 through T1. Electronically Signed   By: Nelson Chimes M.D.   On: 07/19/2019 17:24   ECHOCARDIOGRAM COMPLETE  Result Date: 07/18/2019   ECHOCARDIOGRAM REPORT   Patient Name:   Lucas Price Date of Exam: 07/18/2019 Medical Rec #:  [id number]      Height:       63.0 in Accession #:    [id number]     Weight:       199.1 lb Date of Birth:  [birth year]       BSA:          1.93 m Patient Age:    26 years       BP:           121/59 mmHg Patient Gender: M              HR:           106 bpm. Exam Location:  Inpatient Procedure: 2D Echo and  Intracardiac Opacification Agent Indications:     Pulmonary hypertension  History:         Patient has prior history of Echocardiogram examinations, most                  recent 08/06/2019. COPD; Risk Factors:Former Smoker, Diabetes,                  Hypertension and Dyslipidemia. CKD.  Sonographer:     Clayton Lefort RDCS (AE) Referring Phys:  [id number] Kipp Brood Diagnosing Phys: Dixie Dials MD  Sonographer Comments: Technically challenging study due to limited acoustic windows, Technically difficult study due to poor echo windows, suboptimal parasternal window, suboptimal apical window, suboptimal subcostal window, patient is morbidly obese and  echo performed with patient supine and on artificial respirator. Image acquisition challenging due to patient body habitus and Patient at 46. IMPRESSIONS  1. Left ventricular ejection fraction, by visual estimation, is 60 to 65%. The left ventricle has hyperdynamic function. Left ventricular septal wall thickness was moderately increased. Moderately increased left ventricular posterior wall thickness. There is moderately increased left ventricular hypertrophy.  2. Definity contrast agent was given IV to delineate the left ventricular endocardial borders.  3. Left ventricular diastolic parameters are consistent with Grade I diastolic dysfunction (impaired relaxation).  4. Akinetic right ventricular apex.  5. Global right ventricle has severely reduced systolic function.The right ventricular size is moderately enlarged. Mildly increased right ventricular wall thickness.  6. Left atrial size was elongated.  7. LA has elongated shape due to severely dilated RA.  8. Right atrial size was severely dilated.  9. Large pleural effusion in both left and right lateral regions. 10. The mitral valve is normal in structure. Trace mitral valve regurgitation. 11. The tricuspid valve is normal in structure.  Tricuspid valve regurgitation is severe. 12. The aortic valve is tricuspid. Aortic  valve regurgitation is trivial. Mild aortic valve sclerosis without stenosis. 13. The pulmonic valve was grossly normal. Pulmonic valve regurgitation is moderate. 14. Mild plaque invoving the ascending aorta. 15. Severely elevated pulmonary artery systolic pressure. 16. The tricuspid regurgitant velocity is 4.81 m/s, and with an assumed right atrial pressure of 10 mmHg, the estimated right ventricular systolic pressure is severely elevated at 102.4 mmHg. 17. The inferior vena cava is dilated in size with <50% respiratory variability, suggesting right atrial pressure of 15 mmHg. 18. Changes from prior study are noted. 19. Further increase in pulmonary systolic hypertension and dilatation of RV. FINDINGS  Left Ventricle: Left ventricular ejection fraction, by visual estimation, is 60 to 65%. The left ventricle has hyperdynamic function. Definity contrast agent was given IV to delineate the left ventricular endocardial borders. Moderately increased left ventricular posterior wall thickness. There is moderately increased left ventricular hypertrophy. Concentric left ventricular hypertrophy. Left ventricular diastolic parameters are consistent with Grade I diastolic dysfunction (impaired relaxation). Right Ventricle: The right ventricular size is moderately enlarged. Mildly increased right ventricular wall thickness. Global RV systolic function is has severely reduced systolic function. The tricuspid regurgitant velocity is 4.81 m/s, and with an assumed right atrial pressure of 10 mmHg, the estimated right ventricular systolic pressure is severely elevated at 102.4 mmHg. The motion of the right ventricle apex akinetic. Free wall basal segment is moving well. Left Atrium: Left atrial size was elongated. LA has elongated shape due to severely dilated RA. Right Atrium: Right atrial size was severely dilated Pericardium: There is no evidence of pericardial effusion. There is a large pleural effusion in both left and right  lateral regions. Mitral Valve: The mitral valve is normal in structure. Trace mitral valve regurgitation. Tricuspid Valve: The tricuspid valve is normal in structure. Tricuspid valve regurgitation is severe. Aortic Valve: The aortic valve is tricuspid. Aortic valve regurgitation is trivial. Mild aortic valve sclerosis is present, with no evidence of aortic valve stenosis. Moderate aortic valve annular calcification. Pulmonic Valve: The pulmonic valve was grossly normal. Pulmonic valve regurgitation is moderate. Aorta: The aortic root is normal in size and structure. There is mild, atheroma plaque involving the ascending aorta. Venous: The inferior vena cava is dilated in size with less than 50% respiratory variability, suggesting right atrial pressure of 15 mmHg. IAS/Shunts: No atrial level shunt detected by color flow Doppler.  LEFT VENTRICLE PLAX 2D LVIDd:         2.10 cm  Diastology LVIDs:         1.40 cm  LV e' lateral:   5.44 cm/s LV PW:         1.80 cm  LV E/e' lateral: 13.7 LV IVS:        1.50 cm  LV e' medial:    9.03 cm/s LVOT diam:     2.00 cm  LV E/e' medial:  8.2 LV SV:         9 ml LV SV Index:   4.58 LVOT Area:     3.14 cm  RIGHT VENTRICLE          IVC RV Basal diam:  3.40 cm  IVC diam: 2.40 cm LEFT ATRIUM           Index      RIGHT ATRIUM           Index LA diam:      1.90 cm 0.98 cm/m RA Area:  18.70 cm LA Vol (A4C): 17.6 ml 9.12 ml/m RA Volume:   57.20 ml  29.64 ml/m  AORTIC VALVE LVOT Vmax:   125.00 cm/s LVOT Vmean:  86.400 cm/s LVOT VTI:    0.142 m  AORTA Ao Root diam: 3.40 cm MITRAL VALVE                        TRICUSPID VALVE MV Area (PHT): 4.21 cm             TR Peak grad:   92.4 mmHg MV PHT:        52.20 msec           TR Vmax:        516.00 cm/s MV Decel Time: 180 msec MV E velocity: 74.40 cm/s 103 cm/s  SHUNTS MV A velocity: 77.70 cm/s 70.3 cm/s Systemic VTI:  0.14 m MV E/A ratio:  0.96       1.5       Systemic Diam: 2.00 cm  Dixie Dials MD Electronically signed by Dixie Dials MD  Signature Date/Time: 07/18/2019/6:21:17 PM    Final    ECHOCARDIOGRAM COMPLETE  Result Date: 07/12/2019   ECHOCARDIOGRAM REPORT   Patient Name:   Lucas Price Date of Exam: 08/11/2019 Medical Rec #:  [id number]      Height:       63.0 in Accession #:    [id number]     Weight:       178.0 lb Date of Birth:  [birth year]       BSA:          1.84 m Patient Age:    51 years       BP:           111/58 mmHg Patient Gender: M              HR:           65 bpm. Exam Location:  Inpatient Procedure: 2D Echo, Cardiac Doppler and Color Doppler Indications:     Z01.818 Encounter for other preprocedural examination  History:         Patient has no prior history of Echocardiogram examinations.                  COPD, Signs/Symptoms:Dyspnea; Risk Factors:Hypertension,                  Diabetes and Sleep Apnea. CKD. GERD.  Sonographer:     Tiffany Dance Referring Phys:  [id number] Shela Leff Diagnosing Phys: Adrian Prows MD IMPRESSIONS  1. Left ventricular ejection fraction, by visual estimation, is 55 to 60%. The left ventricle has normal function. There is no left ventricular hypertrophy. Elevated left atrial and left ventricular end-diastolic pressures. Left ventricular diastolic parameters are consistent with Grade I diastolic dysfunction (impaired relaxation). The left ventricle has no regional wall motion abnormalities.  2. Global right ventricle has normal systolic function.The right ventricular size is moderately enlarged. Mildly increased right ventricular wall thickness. Right ventricle findings are consistent with cor pulmonale.  3. Left atrial size was mild-moderately dilated.  4. Right atrial size was mild-moderately dilated.  5. The mitral valve is degenerative. No evidence of mitral valve regurgitation. No evidence of mitral stenosis.  6. The tricuspid valve is normal in structure. Tricuspid valve regurgitation mild-moderate.  7. Aortic valve regurgitation is not visualized. No evidence of aortic valve sclerosis or  stenosis. There is Mild calcification of the aortic valve. There  is Mild thickening of the aortic valve.  8. The tricuspid regurgitant velocity is 2.84 m/s, and with an assumed right atrial pressure of 8 mmHg, the estimated right ventricular systolic pressure is moderately elevated at 40.4 mmHg. Moderately elevated pulmonary artery systolic pressure. FINDINGS  Left Ventricle: Left ventricular ejection fraction, by visual estimation, is 55 to 60%. The left ventricle has normal function. The left ventricle has no regional wall motion abnormalities. The left ventricular internal cavity size was the left ventricle is normal in size. There is no left ventricular hypertrophy. Left ventricular diastolic parameters are consistent with Grade I diastolic dysfunction (impaired relaxation). Elevated left atrial and left ventricular end-diastolic pressures. Right Ventricle: The right ventricular size is moderately enlarged. Mildly increased right ventricular wall thickness. Global RV systolic function is has normal systolic function. The tricuspid regurgitant velocity is 2.84 m/s, and with an assumed right atrial pressure of 8 mmHg, the estimated right ventricular systolic pressure is moderately elevated at 40.4 mmHg. Right ventricle findings are consistent with cor pulmonale. Left Atrium: Left atrial size was mild-moderately dilated. Right Atrium: Right atrial size was mild-moderately dilated Pericardium: There is no evidence of pericardial effusion. Mitral Valve: The mitral valve is degenerative in appearance. There is mild calcification of the mitral valve leaflet(s). No evidence of mitral valve stenosis by observation. No evidence of mitral valve regurgitation. Tricuspid Valve: The tricuspid valve is normal in structure. Tricuspid valve regurgitation mild-moderate. Aortic Valve: The aortic valve is tricuspid. . There is Mild thickening and Mild calcification of the aortic valve. Aortic valve regurgitation is not visualized.  The aortic valve is structurally normal, with no evidence of sclerosis or stenosis. There is  Mild thickening of the aortic valve. Mild calcification. Pulmonic Valve: The pulmonic valve was not well visualized. Pulmonic valve regurgitation is not visualized. Aorta: The aortic root is normal in size and structure. IAS/Shunts: The interatrial septum was not well visualized.  LEFT VENTRICLE PLAX 2D LVIDd:         3.85 cm  Diastology LVIDs:         2.52 cm  LV e' lateral:   4.35 cm/s LV PW:         1.07 cm  LV E/e' lateral: 16.3 LV IVS:        1.02 cm  LV e' medial:    4.46 cm/s LVOT diam:     1.80 cm  LV E/e' medial:  15.9 LV SV:         41 ml LV SV Index:   21.38 LVOT Area:     2.54 cm  RIGHT VENTRICLE RV Basal diam:  2.92 cm RV S prime:     12.60 cm/s TAPSE (M-mode): 2.1 cm LEFT ATRIUM             Index       RIGHT ATRIUM           Index LA diam:        3.00 cm 1.63 cm/m  RA Area:     16.60 cm LA Vol (A2C):   34.9 ml 18.97 ml/m RA Volume:   45.10 ml  24.51 ml/m LA Vol (A4C):   36.6 ml 19.89 ml/m LA Biplane Vol: 36.0 ml 19.56 ml/m  AORTIC VALVE LVOT Vmax:   97.20 cm/s LVOT Vmean:  57.300 cm/s LVOT VTI:    0.202 m  AORTA Ao Root diam: 3.90 cm Ao Asc diam:  2.90 cm MITRAL VALVE  TRICUSPID VALVE MV Area (PHT): 3.53 cm             TR Peak grad:   32.4 mmHg MV PHT:        62.35 msec           TR Vmax:        370.00 cm/s MV Decel Time: 215 msec MV E velocity: 70.80 cm/s 103 cm/s  SHUNTS MV A velocity: 95.60 cm/s 70.3 cm/s Systemic VTI:  0.20 m MV E/A ratio:  0.74       1.5       Systemic Diam: 1.80 cm  Adrian Prows MD Electronically signed by Adrian Prows MD Signature Date/Time: 07/29/2019/12:56:27 PM    Final    VAS [place] LOWER EXTREMITY VENOUS (DVT)  Result Date: 07/19/2019  Lower Venous Study Indications: RV failure.  Limitations: Body habitus, poor ultrasound/tissue interface, bandages and restricted mobility, ventilated, bright ICU room. Comparison Study: No prior study. Performing Technologist:  Maudry Mayhew MHA, RDMS, RVT, RDCS  Examination Guidelines: A complete evaluation includes B-mode imaging, spectral Doppler, color Doppler, and power Doppler as needed of all accessible portions of each vessel. Bilateral testing is considered an integral part of a complete examination. Limited examinations for reoccurring indications may be performed as noted.  +---------+---------------+---------+-----------+----------+--------------+ RIGHT    CompressibilityPhasicitySpontaneityPropertiesThrombus Aging +---------+---------------+---------+-----------+----------+--------------+ CFV                                                   Not visualized +---------+---------------+---------+-----------+----------+--------------+ SFJ                                                   Not visualized +---------+---------------+---------+-----------+----------+--------------+ FV Prox  Full                                                        +---------+---------------+---------+-----------+----------+--------------+ FV Mid   Full                                                        +---------+---------------+---------+-----------+----------+--------------+ FV Distal                                             Not visualized +---------+---------------+---------+-----------+----------+--------------+ PFV      Full                                                        +---------+---------------+---------+-----------+----------+--------------+ POP      Full           No       Yes                                 +---------+---------------+---------+-----------+----------+--------------+  PTV      Full                                                        +---------+---------------+---------+-----------+----------+--------------+ PERO                                                  Not visualized  +---------+---------------+---------+-----------+----------+--------------+   +---------+---------------+---------+-----------+----------+--------------+ LEFT     CompressibilityPhasicitySpontaneityPropertiesThrombus Aging +---------+---------------+---------+-----------+----------+--------------+ CFV      Full           No       Yes                                 +---------+---------------+---------+-----------+----------+--------------+ SFJ      Full                                                        +---------+---------------+---------+-----------+----------+--------------+ FV Prox  Full                                                        +---------+---------------+---------+-----------+----------+--------------+ FV Mid   Full                                                        +---------+---------------+---------+-----------+----------+--------------+ FV DistalFull                                                        +---------+---------------+---------+-----------+----------+--------------+ PFV                                                   Not visualized +---------+---------------+---------+-----------+----------+--------------+ POP      Full                                                        +---------+---------------+---------+-----------+----------+--------------+ PTV                                                   Not visualized +---------+---------------+---------+-----------+----------+--------------+ PERO  Not visualized +---------+---------------+---------+-----------+----------+--------------+ Unable to evaluate left popliteal vein in long due to patient position.  Summary: Right: There is no evidence of deep vein thrombosis in the lower extremity. However, portions of this examination were limited- see technologist comments above. No cystic structure found in the  popliteal fossa. Left: There is no evidence of deep vein thrombosis in the lower extremity. However, portions of this examination were limited- see technologist comments above. No cystic structure found in the popliteal fossa.  *See table(s) above for measurements and observations. Electronically signed by Harold Barban MD on 07/19/2019 at 5:23:03 PM.    Final    [place] EKG SITE RITE  Result Date: 07/16/2019 If Site Rite image not attached, placement could not be confirmed due to current cardiac rhythm.  Adrian Prows, MD 07/27/2019, 10:11 PM  Pager: [phone] Office: [phone] If no answer: [phone]

## 2019-08-12 DEATH — deceased

## 2019-09-06 ENCOUNTER — Ambulatory Visit: Payer: PRIVATE HEALTH INSURANCE | Admitting: Cardiology
